# Patient Record
Sex: Male | Born: 1960 | Race: White | Hispanic: No | Marital: Married | Smoking: Never smoker
Health system: Southern US, Community
[De-identification: ages and names within clinical notes are randomized; demographics above are authoritative.]

## PROBLEM LIST (undated history)

## (undated) DIAGNOSIS — F419 Anxiety disorder, unspecified: Secondary | ICD-10-CM

## (undated) DIAGNOSIS — R7309 Other abnormal glucose: Secondary | ICD-10-CM

## (undated) DIAGNOSIS — E291 Testicular hypofunction: Secondary | ICD-10-CM

## (undated) DIAGNOSIS — M545 Low back pain, unspecified: Secondary | ICD-10-CM

## (undated) DIAGNOSIS — L989 Disorder of the skin and subcutaneous tissue, unspecified: Secondary | ICD-10-CM

## (undated) DIAGNOSIS — Z860100 Personal history of colon polyps, unspecified: Secondary | ICD-10-CM

## (undated) DIAGNOSIS — F32A Depression, unspecified: Secondary | ICD-10-CM

## (undated) DIAGNOSIS — T7840XA Allergy, unspecified, initial encounter: Secondary | ICD-10-CM

## (undated) DIAGNOSIS — F329 Major depressive disorder, single episode, unspecified: Secondary | ICD-10-CM

## (undated) DIAGNOSIS — E785 Hyperlipidemia, unspecified: Secondary | ICD-10-CM

## (undated) DIAGNOSIS — M1990 Unspecified osteoarthritis, unspecified site: Secondary | ICD-10-CM

## (undated) DIAGNOSIS — K219 Gastro-esophageal reflux disease without esophagitis: Secondary | ICD-10-CM

## (undated) DIAGNOSIS — N529 Male erectile dysfunction, unspecified: Secondary | ICD-10-CM

## (undated) DIAGNOSIS — G4700 Insomnia, unspecified: Secondary | ICD-10-CM

## (undated) DIAGNOSIS — I10 Essential (primary) hypertension: Secondary | ICD-10-CM

## (undated) HISTORY — DX: Personal history of colonic polyps: Z86.010

## (undated) HISTORY — DX: Anxiety disorder, unspecified: F41.9

## (undated) HISTORY — DX: Depression, unspecified: F32.A

## (undated) HISTORY — DX: Low back pain, unspecified: M54.50

## (undated) HISTORY — DX: Major depressive disorder, single episode, unspecified: F32.9

## (undated) HISTORY — DX: Low back pain: M54.5

## (undated) HISTORY — DX: Insomnia, unspecified: G47.00

## (undated) HISTORY — DX: Disorder of the skin and subcutaneous tissue, unspecified: L98.9

## (undated) HISTORY — DX: Personal history of colon polyps, unspecified: Z86.0100

## (undated) HISTORY — DX: Male erectile dysfunction, unspecified: N52.9

## (undated) HISTORY — DX: Testicular hypofunction: E29.1

## (undated) HISTORY — DX: Allergy, unspecified, initial encounter: T78.40XA

## (undated) HISTORY — DX: Other abnormal glucose: R73.09

## (undated) HISTORY — DX: Hyperlipidemia, unspecified: E78.5

## (undated) HISTORY — DX: Essential (primary) hypertension: I10

## (undated) HISTORY — DX: Unspecified osteoarthritis, unspecified site: M19.90

## (undated) HISTORY — PX: BREAST MASS EXCISION: SHX1267

## (undated) HISTORY — PX: MASS EXCISION: SHX2000

---

## 2000-02-28 HISTORY — PX: EYE SURGERY: SHX253

## 2003-08-13 DIAGNOSIS — L989 Disorder of the skin and subcutaneous tissue, unspecified: Secondary | ICD-10-CM | POA: Insufficient documentation

## 2007-04-18 ENCOUNTER — Ambulatory Visit: Payer: Self-pay | Admitting: Gastroenterology

## 2007-05-01 ENCOUNTER — Ambulatory Visit: Payer: Self-pay | Admitting: Gastroenterology

## 2007-05-01 ENCOUNTER — Encounter: Payer: Self-pay | Admitting: Gastroenterology

## 2010-10-17 ENCOUNTER — Other Ambulatory Visit: Payer: Self-pay | Admitting: Internal Medicine

## 2010-10-17 DIAGNOSIS — R519 Headache, unspecified: Secondary | ICD-10-CM

## 2010-10-19 ENCOUNTER — Ambulatory Visit
Admission: RE | Admit: 2010-10-19 | Discharge: 2010-10-19 | Disposition: A | Discharge status: Home / Self Care | Payer: 59 | Source: Ambulatory Visit | Attending: Internal Medicine | Admitting: Internal Medicine

## 2010-10-19 DIAGNOSIS — R519 Headache, unspecified: Secondary | ICD-10-CM

## 2010-10-19 MED ORDER — GADOBENATE DIMEGLUMINE 529 MG/ML IV SOLN
10.0000 mL | Freq: Once | INTRAVENOUS | Status: AC | PRN
  Administered 2010-10-19: 10 mL via INTRAVENOUS

## 2010-11-30 ENCOUNTER — Other Ambulatory Visit: Payer: Self-pay

## 2011-02-05 ENCOUNTER — Ambulatory Visit (INDEPENDENT_AMBULATORY_CARE_PROVIDER_SITE_OTHER): Payer: 59

## 2011-02-05 DIAGNOSIS — E236 Other disorders of pituitary gland: Secondary | ICD-10-CM

## 2011-02-20 ENCOUNTER — Ambulatory Visit (INDEPENDENT_AMBULATORY_CARE_PROVIDER_SITE_OTHER): Payer: 59

## 2011-02-20 DIAGNOSIS — E236 Other disorders of pituitary gland: Secondary | ICD-10-CM

## 2011-03-03 ENCOUNTER — Ambulatory Visit (INDEPENDENT_AMBULATORY_CARE_PROVIDER_SITE_OTHER): Payer: BC Managed Care – PPO | Admitting: Physician Assistant

## 2011-03-03 DIAGNOSIS — M629 Disorder of muscle, unspecified: Secondary | ICD-10-CM

## 2011-03-03 DIAGNOSIS — R030 Elevated blood-pressure reading, without diagnosis of hypertension: Secondary | ICD-10-CM

## 2011-03-03 DIAGNOSIS — E789 Disorder of lipoprotein metabolism, unspecified: Secondary | ICD-10-CM

## 2011-03-03 DIAGNOSIS — E236 Other disorders of pituitary gland: Secondary | ICD-10-CM

## 2011-03-15 ENCOUNTER — Encounter: Payer: Self-pay | Admitting: Physician Assistant

## 2011-03-15 DIAGNOSIS — L989 Disorder of the skin and subcutaneous tissue, unspecified: Secondary | ICD-10-CM

## 2011-03-15 DIAGNOSIS — N529 Male erectile dysfunction, unspecified: Secondary | ICD-10-CM | POA: Insufficient documentation

## 2011-03-15 DIAGNOSIS — G4700 Insomnia, unspecified: Secondary | ICD-10-CM

## 2011-03-15 DIAGNOSIS — E349 Endocrine disorder, unspecified: Secondary | ICD-10-CM | POA: Insufficient documentation

## 2011-03-15 DIAGNOSIS — F419 Anxiety disorder, unspecified: Secondary | ICD-10-CM | POA: Insufficient documentation

## 2011-03-15 DIAGNOSIS — M545 Low back pain, unspecified: Secondary | ICD-10-CM

## 2011-03-15 DIAGNOSIS — Z86010 Personal history of colonic polyps: Secondary | ICD-10-CM

## 2011-03-15 DIAGNOSIS — F329 Major depressive disorder, single episode, unspecified: Secondary | ICD-10-CM

## 2011-03-15 DIAGNOSIS — F32A Depression, unspecified: Secondary | ICD-10-CM

## 2011-03-15 DIAGNOSIS — R7309 Other abnormal glucose: Secondary | ICD-10-CM

## 2011-03-15 DIAGNOSIS — E785 Hyperlipidemia, unspecified: Secondary | ICD-10-CM

## 2011-03-19 ENCOUNTER — Ambulatory Visit (INDEPENDENT_AMBULATORY_CARE_PROVIDER_SITE_OTHER): Payer: BC Managed Care – PPO

## 2011-03-19 DIAGNOSIS — E236 Other disorders of pituitary gland: Secondary | ICD-10-CM

## 2011-04-02 ENCOUNTER — Ambulatory Visit (INDEPENDENT_AMBULATORY_CARE_PROVIDER_SITE_OTHER): Payer: BC Managed Care – PPO | Admitting: Physician Assistant

## 2011-04-02 DIAGNOSIS — E236 Other disorders of pituitary gland: Secondary | ICD-10-CM

## 2011-04-02 MED ORDER — TESTOSTERONE CYPIONATE 200 MG/ML IM SOLN
200.0000 mg | Freq: Once | INTRAMUSCULAR | Status: AC
  Administered 2011-04-02: 200 mg via INTRAMUSCULAR

## 2011-04-16 ENCOUNTER — Ambulatory Visit: Payer: BC Managed Care – PPO

## 2011-04-16 ENCOUNTER — Ambulatory Visit (INDEPENDENT_AMBULATORY_CARE_PROVIDER_SITE_OTHER): Payer: BC Managed Care – PPO | Admitting: Physician Assistant

## 2011-04-16 DIAGNOSIS — E291 Testicular hypofunction: Secondary | ICD-10-CM

## 2011-04-16 DIAGNOSIS — E236 Other disorders of pituitary gland: Secondary | ICD-10-CM

## 2011-04-16 MED ORDER — TESTOSTERONE CYPIONATE 200 MG/ML IM SOLN
200.0000 mg | Freq: Once | INTRAMUSCULAR | Status: AC
  Administered 2011-04-16: 200 mg via INTRAMUSCULAR

## 2011-04-16 NOTE — Progress Notes (Signed)
OK for testosterone injection. Last PSA normal 02/2011. See paper chart for standing order of injection q 2 weeks.  Eula Listen, PA-C 04/16/2011 8:07 AM

## 2011-04-30 ENCOUNTER — Ambulatory Visit (INDEPENDENT_AMBULATORY_CARE_PROVIDER_SITE_OTHER): Payer: BC Managed Care – PPO | Admitting: Physician Assistant

## 2011-04-30 DIAGNOSIS — E236 Other disorders of pituitary gland: Secondary | ICD-10-CM

## 2011-04-30 NOTE — Progress Notes (Signed)
  Subjective:    Patient ID: Curtis Yang, male    DOB: [birth year], 51 y.o.   MRN: [id number]  HPI  Testosterone injection only  Review of Systems     Objective:   Physical Exam        Assessment & Plan:

## 2011-05-14 ENCOUNTER — Telehealth: Payer: Self-pay | Admitting: Radiology

## 2011-05-14 ENCOUNTER — Ambulatory Visit (INDEPENDENT_AMBULATORY_CARE_PROVIDER_SITE_OTHER): Payer: BC Managed Care – PPO | Admitting: Physician Assistant

## 2011-05-14 DIAGNOSIS — E291 Testicular hypofunction: Secondary | ICD-10-CM

## 2011-05-14 DIAGNOSIS — E236 Other disorders of pituitary gland: Secondary | ICD-10-CM

## 2011-05-14 MED ORDER — TESTOSTERONE CYPIONATE 200 MG/ML IM SOLN
200.0000 mg | INTRAMUSCULAR | Status: DC
  Administered 2011-05-14 – 2011-06-07 (×2): 200 mg via INTRAMUSCULAR

## 2011-05-14 NOTE — Progress Notes (Signed)
Needs Testosterone 200/ml today.  Standing order entered for q 14 days through July when he needs CPE.  OK to give today

## 2011-05-16 MED ORDER — TESTOSTERONE CYPIONATE 200 MG/ML IM SOLN: 200.0000 mg | INTRAMUSCULAR | Status: DC

## 2011-05-17 NOTE — Telephone Encounter (Signed)
Spoke with patient and informed him that his rx was called in; patient appreciative

## 2011-05-28 ENCOUNTER — Ambulatory Visit (INDEPENDENT_AMBULATORY_CARE_PROVIDER_SITE_OTHER): Payer: BC Managed Care – PPO | Admitting: Physician Assistant

## 2011-05-28 VITALS — BP 132/78 | HR 99 | Temp 98.4°F | Resp 16 | Ht 72.0 in | Wt 201.0 lb

## 2011-05-28 DIAGNOSIS — E236 Other disorders of pituitary gland: Secondary | ICD-10-CM

## 2011-05-28 DIAGNOSIS — E291 Testicular hypofunction: Secondary | ICD-10-CM

## 2011-05-28 MED ORDER — TESTOSTERONE CYPIONATE 200 MG/ML IM SOLN
200.0000 mg | INTRAMUSCULAR | Status: DC
  Administered 2011-05-28 – 2011-07-24 (×3): 200 mg via INTRAMUSCULAR

## 2011-05-28 NOTE — Progress Notes (Signed)
Here for testosterone injection. Ok for injection. Labs reviewed.  Latonga Ponder

## 2011-06-07 ENCOUNTER — Ambulatory Visit (INDEPENDENT_AMBULATORY_CARE_PROVIDER_SITE_OTHER): Payer: BC Managed Care – PPO | Admitting: Internal Medicine

## 2011-06-07 ENCOUNTER — Encounter: Payer: Self-pay | Admitting: Physician Assistant

## 2011-06-07 VITALS — BP 128/88 | HR 89 | Temp 97.4°F | Resp 16 | Ht 72.0 in | Wt 198.0 lb

## 2011-06-07 DIAGNOSIS — F458 Other somatoform disorders: Secondary | ICD-10-CM

## 2011-06-07 DIAGNOSIS — H9201 Otalgia, right ear: Secondary | ICD-10-CM

## 2011-06-07 DIAGNOSIS — J302 Other seasonal allergic rhinitis: Secondary | ICD-10-CM

## 2011-06-07 DIAGNOSIS — F411 Generalized anxiety disorder: Secondary | ICD-10-CM

## 2011-06-07 DIAGNOSIS — R6884 Jaw pain: Secondary | ICD-10-CM

## 2011-06-07 DIAGNOSIS — E236 Other disorders of pituitary gland: Secondary | ICD-10-CM

## 2011-06-07 DIAGNOSIS — R079 Chest pain, unspecified: Secondary | ICD-10-CM

## 2011-06-07 DIAGNOSIS — R109 Unspecified abdominal pain: Secondary | ICD-10-CM

## 2011-06-07 LAB — POCT UA - MICROSCOPIC ONLY
Mucus, UA: NEGATIVE
WBC, Ur, HPF, POC: NEGATIVE
Yeast, UA: NEGATIVE

## 2011-06-07 LAB — POCT URINALYSIS DIPSTICK
Ketones, UA: NEGATIVE
Protein, UA: NEGATIVE
Spec Grav, UA: 1.02
pH, UA: 7.5

## 2011-06-07 MED ORDER — OMEPRAZOLE 20 MG PO CPDR: 20.0000 mg | DELAYED_RELEASE_CAPSULE | Freq: Every day | ORAL | Status: DC

## 2011-06-07 NOTE — Progress Notes (Signed)
Subjective:    Patient ID: Curtis Yang, male    DOB: Aug 13, 1960, 51 y.o.   MRN: [id number]  HPI 65)51 yo w substernal chest pain, worsens with stress. Or with deep breath. No SOB. Some L jaw tightness. No pain right now.  Pain is sharp, lasts several minutes.  Shllow breathing seems to help. No diaphoresis.  He does a physically strenuous job and hasn't had chest pain during these times.  He has been experiencing the CP now for 2 weeks. Admits to increase stress at home.  Hasn't tried any OTC meds.  No palpitations.  No nausea.  2) A few days early, but wants to get testosterone injection today while here.  3)R otalgia  4) R side pain from mid lateral abdomen diagonally toward groin.  Occasionally it is sharp, but mostly just a dull ache.  Present X couple weeks.  No h/o nephrolithiasis.  Worse with bending forward.  He has been doing yard work with lots of bending, raking, etc. No bulging  FH maternal gm stents in 60s, but she lived to be in her 48's. Maternal gf died of stroke in late 90s. His other family history is vague as he wasn't raised by his parents.  His does know that many males suffered and died of heart disease on his father's side of the family.  Review of Systems  All other systems reviewed and are negative.       Objective:   Physical Exam  Constitutional: He is oriented to person, place, and time. He appears well-developed and well-nourished.       Appears more on the anxious side for him, but normal and NAD.  HENT:  Head: Normocephalic and atraumatic.  Right Ear: External ear normal.  Left Ear: External ear normal.  Mouth/Throat: Oropharynx is clear and moist. No oropharyngeal exudate.       Turbinates pale and slightly boggy.  Slight fluid behind R TM.  TTP over the TMJ  Neck: No JVD present. No tracheal deviation present. No thyromegaly present.  Cardiovascular: Normal rate, regular rhythm, normal heart sounds and intact distal pulses.  Exam reveals no  gallop and no friction rub.   No murmur heard. Pulmonary/Chest: Effort normal and breath sounds normal.  Abdominal: Soft. Bowel sounds are normal. He exhibits no distension and no mass. There is no rebound and no guarding.       Slight TTP over psoas/illiopsoas distribution.  No inguinal hernia.  Neurological: He is alert and oriented to person, place, and time.  Skin: Skin is warm and dry.      Results for orders placed in visit on 06/07/11  POCT URINALYSIS DIPSTICK      Component Value Range   Color, UA yellow     Clarity, UA clear     Glucose, UA neg     Bilirubin, UA neg     Ketones, UA neg     Spec Grav, UA 1.020     Blood, UA neg     pH, UA 7.5     Protein, UA neg     Urobilinogen, UA 0.2     Nitrite, UA neg     Leukocytes, UA Negative    POCT UA - MICROSCOPIC ONLY      Component Value Range   WBC, Ur, HPF, POC neg     RBC, urine, microscopic 1-3     Bacteria, U Microscopic neg     Mucus, UA neg     Epithelial cells,  urine per micros 0-1     Crystals, Ur, HPF, POC neg     Casts, Ur, LPF, POC neg     Yeast, UA neg     EKG: sinus arrythmia unchanged from previous tracings, read by Ellamae Sia, MD     Assessment & Plan:  Chest pain-No changes on EKG.  Discussed CP warnings, call 911 and patient expresses understanding and agrees.  Refer to cardiology-?stress treadmill.  Also will try omeprazole to see if reflux may be contributing to his symptoms. Increased situational anxiety as a contributing factor. 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  Restart flonase and buy and use a bite block. R abd pain-non acute abdomen-believe musculoskeletal-rest area, tylenol or advil, RTC if any changes or increases.  Last CMET in 02/2011 was normal.

## 2011-06-07 NOTE — Patient Instructions (Signed)
Call 911 if Chest pain worsens or changes

## 2011-06-19 ENCOUNTER — Telehealth: Payer: Self-pay

## 2011-06-19 NOTE — Telephone Encounter (Signed)
PT STATES THE LORAZEPAM HE IS ON ISN'T HELPING ANYMORE AND WOULD LIKE TO KNOW IF HE CAN INCREASE THE DOSAGE OR GET SOMETHING ELSE PLEASE CALL [phone]

## 2011-06-19 NOTE — Telephone Encounter (Signed)
Please advise on this.  

## 2011-06-19 NOTE — Telephone Encounter (Signed)
Spoke with patient and let him know that he needed to rtc before new meds were prescribed.  He stated that he would come in on Tuesday to see Marylene Land.

## 2011-06-19 NOTE — Telephone Encounter (Signed)
Due to his recent chest pain, he needs to return to clinic to discuss what meds might be best for him,  Dr. Merla Riches is here tomorrow 10-4

## 2011-06-20 ENCOUNTER — Encounter: Payer: Self-pay | Admitting: *Deleted

## 2011-06-20 ENCOUNTER — Encounter: Payer: Self-pay | Admitting: Cardiovascular Disease

## 2011-06-21 ENCOUNTER — Encounter: Payer: Self-pay | Admitting: Cardiovascular Disease

## 2011-06-21 ENCOUNTER — Ambulatory Visit (INDEPENDENT_AMBULATORY_CARE_PROVIDER_SITE_OTHER): Payer: BC Managed Care – PPO | Admitting: Cardiovascular Disease

## 2011-06-21 DIAGNOSIS — G4700 Insomnia, unspecified: Secondary | ICD-10-CM

## 2011-06-21 DIAGNOSIS — F411 Generalized anxiety disorder: Secondary | ICD-10-CM

## 2011-06-21 DIAGNOSIS — E291 Testicular hypofunction: Secondary | ICD-10-CM

## 2011-06-21 DIAGNOSIS — R079 Chest pain, unspecified: Secondary | ICD-10-CM | POA: Insufficient documentation

## 2011-06-21 DIAGNOSIS — F419 Anxiety disorder, unspecified: Secondary | ICD-10-CM

## 2011-06-21 NOTE — Progress Notes (Signed)
Patient ID: Curtis Yang, male   DOB: 1960/12/16, 51 y.o.   MRN: [id number] 51 yo referred by Dr Cleta Alberts for SSCP.  Atypical.  Associated with anxiety disorder.  Anxiety runs in family.  Does not see psychiatrist but on lorazapam.  SSCP during anxious times maybe once/month.  Can las days and tightness in chest.  No dyspnea, diaphoresis.  Occasional palpitaitons.  Does not get it when he is calm.  Stress at home or work set him off.  Has not had recent stress test.  Limited CRF;s.  Has worse insomnia lately.    ROS: Denies fever, malais, weight loss, blurry vision, decreased visual acuity, cough, sputum, SOB, hemoptysis, pleuritic pain, palpitaitons, heartburn, abdominal pain, melena, lower extremity edema, claudication, or rash.  All other systems reviewed and negative   General: Affect appropriate Healthy:  appears stated age HEENT: normal Neck supple with no adenopathy JVP normal no bruits no thyromegaly Lungs clear with no wheezing and good diaphragmatic motion Heart:  S1/S2 no murmur,rub, gallop or click PMI normal Abdomen: benighn, BS positve, no tenderness, no AAA no bruit.  No HSM or HJR Distal pulses intact with no bruits No edema Neuro non-focal Skin warm and dry No muscular weakness  Medications Current Outpatient Prescriptions  Medication Sig Dispense Refill  . amitriptyline (ELAVIL) 25 MG tablet Take 25 mg by mouth at bedtime.      [name] aspirin 81 MG tablet Take 81 mg by mouth daily.       . fish oil-omega-3 fatty acids 1000 MG capsule Take 2 g by mouth daily.      [name] LORazepam (ATIVAN) 0.5 MG tablet Take 0.5 mg by mouth 2 (two) times daily.      [name] omeprazole (PRILOSEC) 20 MG capsule Take 1 capsule (20 mg total) by mouth daily.  30 capsule  3  . testosterone cypionate (DEPOTESTOTERONE CYPIONATE) 200 MG/ML injection Inject 1 mL (200 mg total) into the muscle every 14 (fourteen) days.  10 mL  3   Current Facility-Administered Medications  Medication Dose Route Frequency  Provider Last Rate Last Dose  . testosterone cypionate (DEPOTESTOTERONE CYPIONATE) injection 200 mg  200 mg Intramuscular Q14 Days Pattricia Boss, PA-C   200 mg at 06/07/11 1247  . testosterone cypionate (DEPOTESTOTERONE CYPIONATE) injection 200 mg  200 mg Intramuscular Q14 Days Ryan M Dunn, PA-C   200 mg at 05/28/11 0900    Allergies Review of patient's allergies indicates no known allergies.  Family History: Family History  Problem Relation Age of Onset  . Heart disease Maternal Grandmother   . Stroke Maternal Grandfather     Social History: History   Social History  . Marital Status: Married    Spouse Name: N/A    Number of Children: N/A  . Years of Education: N/A   Occupational History  . Not on file.   Social History Main Topics  . Smoking status: Never Smoker   . Smokeless tobacco: Not on file  . Alcohol Use: No  . Drug Use: No  . Sexually Active: Not on file   Other Topics Concern  . Not on file   Social History Narrative  . No narrative on file    Electrocardiogram:  NSR rate 91  Normal ECG  Assessment and Plan

## 2011-06-21 NOTE — Assessment & Plan Note (Signed)
May benefit form psychological referral and not just BZ;s

## 2011-06-21 NOTE — Assessment & Plan Note (Signed)
Suggested melatonin and benedryl in addition to BZ

## 2011-06-21 NOTE — Assessment & Plan Note (Signed)
Likley related to anxiety  Normal exam and ECG  F/U ETT

## 2011-06-21 NOTE — Patient Instructions (Signed)
  Your physician has requested that you have an exercise tolerance test. For further information please visit [email or web address]. Please also follow instruction sheet, as given.  Your physician recommends that you schedule a follow-up appointment as needed with Dr. Nishan.  

## 2011-06-21 NOTE — Assessment & Plan Note (Signed)
Continue shots and F/U level with Dr Cleta Alberts

## 2011-06-25 ENCOUNTER — Ambulatory Visit (INDEPENDENT_AMBULATORY_CARE_PROVIDER_SITE_OTHER): Payer: BC Managed Care – PPO | Admitting: Physician Assistant

## 2011-06-25 VITALS — BP 110/63 | HR 82 | Temp 97.8°F | Resp 16 | Ht 72.0 in | Wt 198.0 lb

## 2011-06-25 DIAGNOSIS — E291 Testicular hypofunction: Secondary | ICD-10-CM

## 2011-06-25 MED ORDER — TESTOSTERONE CYPIONATE 200 MG/ML IM SOLN
200.0000 mg | Freq: Once | INTRAMUSCULAR | Status: AC
  Administered 2011-06-25: 200 mg via INTRAMUSCULAR

## 2011-06-25 NOTE — Progress Notes (Signed)
Ok for testosterone injection. Needs CPE July 2013.  Curtis Yang

## 2011-07-05 ENCOUNTER — Encounter: Payer: Self-pay | Admitting: Nurse Practitioner

## 2011-07-05 ENCOUNTER — Ambulatory Visit (INDEPENDENT_AMBULATORY_CARE_PROVIDER_SITE_OTHER): Payer: BC Managed Care – PPO | Admitting: Nurse Practitioner

## 2011-07-05 DIAGNOSIS — R079 Chest pain, unspecified: Secondary | ICD-10-CM

## 2011-07-05 NOTE — Procedures (Signed)
Exercise Treadmill Test  Pre-Exercise Testing Evaluation Rhythm: normal sinus  Rate: 87   PR:  .14 QRS:  .08  QT:  .35 QTc: .42     Test  Exercise Tolerance Test Ordering MD: Charlton Haws, MD  Interpreting MD: Ward Givens NP  Unique Test No: 1  Treadmill:  1  Indication for ETT: chest pain - rule out ischemia  Contraindication to ETT: No   Stress Modality: exercise - treadmill  Cardiac Imaging Performed: non   Protocol: standard Bruce - maximal  Max BP:  204/91  Max MPHR (bpm):  168 85% MPR (bpm):  142  MPHR obtained (bpm):  169 % MPHR obtained:  100%  Reached 85% MPHR (min:sec):  3:26 Total Exercise Time (min-sec):  6:00  Workload in METS:  7.0 Borg Scale: 17  Reason ETT Terminated:  fatigue    ST Segment Analysis At Rest: normal ST segments - no evidence of significant ST depression With Exercise: no evidence of significant ST depression  Other Information Arrhythmia:  isolated pvc's Angina during ETT:  absent (0) Quality of ETT:  diagnostic  ETT Interpretation:  normal - no evidence of ischemia by ST analysis  Comments: No acute st/t changes.  Hypertensive response to exercise.  Recommendations: F/U Dr. Eden Emms.

## 2011-07-09 ENCOUNTER — Ambulatory Visit (INDEPENDENT_AMBULATORY_CARE_PROVIDER_SITE_OTHER): Payer: BC Managed Care – PPO | Admitting: Physician Assistant

## 2011-07-09 DIAGNOSIS — E291 Testicular hypofunction: Secondary | ICD-10-CM

## 2011-07-09 NOTE — Progress Notes (Signed)
Here for testosterone injection.  OK to give.  Standing order in place. CPE 7/13

## 2011-07-17 ENCOUNTER — Telehealth: Payer: Self-pay | Admitting: Cardiovascular Disease

## 2011-07-17 NOTE — Telephone Encounter (Signed)
PT GXT WAS NORMAL ./CY

## 2011-07-17 NOTE — Telephone Encounter (Signed)
Pt rtn christine's call °

## 2011-07-24 ENCOUNTER — Ambulatory Visit (INDEPENDENT_AMBULATORY_CARE_PROVIDER_SITE_OTHER): Payer: BC Managed Care – PPO | Admitting: Physician Assistant

## 2011-07-24 VITALS — BP 132/74 | HR 70 | Temp 98.4°F | Resp 16 | Ht 72.75 in | Wt 203.8 lb

## 2011-07-24 DIAGNOSIS — R7989 Other specified abnormal findings of blood chemistry: Secondary | ICD-10-CM

## 2011-07-24 DIAGNOSIS — E236 Other disorders of pituitary gland: Secondary | ICD-10-CM

## 2011-07-24 NOTE — Progress Notes (Signed)
Presents for Testosterone injection.  Due for Follow-up visit and labs 07/2011.

## 2011-08-06 ENCOUNTER — Ambulatory Visit (INDEPENDENT_AMBULATORY_CARE_PROVIDER_SITE_OTHER): Payer: BC Managed Care – PPO | Admitting: Physician Assistant

## 2011-08-06 VITALS — BP 118/79 | HR 68 | Resp 18

## 2011-08-06 DIAGNOSIS — E291 Testicular hypofunction: Secondary | ICD-10-CM

## 2011-08-06 MED ORDER — TESTOSTERONE CYPIONATE 200 MG/ML IM SOLN
400.0000 mg | INTRAMUSCULAR | Status: DC
  Administered 2011-08-06 – 2011-10-01 (×5): 400 mg via INTRAMUSCULAR

## 2011-08-06 NOTE — Progress Notes (Signed)
Pt here for 2 week testosterone injection.  He has CPE in 1 month with Ms. McClung, PA-C.  His last injection was 07/24/11.  Ok to give today. Standing order entered for future.

## 2011-08-19 ENCOUNTER — Ambulatory Visit (INDEPENDENT_AMBULATORY_CARE_PROVIDER_SITE_OTHER): Payer: BC Managed Care – PPO | Admitting: Physician Assistant

## 2011-08-19 DIAGNOSIS — E291 Testicular hypofunction: Secondary | ICD-10-CM

## 2011-08-19 MED ORDER — LORAZEPAM 1 MG PO TABS: 1.0000 mg | ORAL_TABLET | Freq: Two times a day (BID) | ORAL | Status: DC

## 2011-08-19 NOTE — Progress Notes (Signed)
He is here today for his scheduled injection but also requests refill of Lorazepam.  Ok to do 1 time only as he needs to see A. McClung, PA-C.  No further refills without eval.

## 2011-09-03 ENCOUNTER — Ambulatory Visit (INDEPENDENT_AMBULATORY_CARE_PROVIDER_SITE_OTHER): Payer: BC Managed Care – PPO | Admitting: Physician Assistant

## 2011-09-03 VITALS — BP 124/68

## 2011-09-03 DIAGNOSIS — E236 Other disorders of pituitary gland: Secondary | ICD-10-CM

## 2011-09-03 DIAGNOSIS — E291 Testicular hypofunction: Secondary | ICD-10-CM

## 2011-09-15 ENCOUNTER — Encounter: Payer: Self-pay | Admitting: Physician Assistant

## 2011-09-15 ENCOUNTER — Ambulatory Visit (INDEPENDENT_AMBULATORY_CARE_PROVIDER_SITE_OTHER): Payer: BC Managed Care – PPO | Admitting: Physician Assistant

## 2011-09-15 VITALS — BP 129/87 | HR 94 | Temp 97.3°F | Resp 16

## 2011-09-15 DIAGNOSIS — E291 Testicular hypofunction: Secondary | ICD-10-CM

## 2011-09-15 DIAGNOSIS — E7800 Pure hypercholesterolemia, unspecified: Secondary | ICD-10-CM

## 2011-09-15 DIAGNOSIS — R030 Elevated blood-pressure reading, without diagnosis of hypertension: Secondary | ICD-10-CM

## 2011-09-15 DIAGNOSIS — R7989 Other specified abnormal findings of blood chemistry: Secondary | ICD-10-CM

## 2011-09-15 DIAGNOSIS — Z125 Encounter for screening for malignant neoplasm of prostate: Secondary | ICD-10-CM

## 2011-09-15 LAB — POCT URINALYSIS DIPSTICK
Bilirubin, UA: NEGATIVE
Blood, UA: NEGATIVE
Nitrite, UA: NEGATIVE
Protein, UA: 30
pH, UA: 7.5

## 2011-09-15 LAB — COMPREHENSIVE METABOLIC PANEL
AST: 19 U/L (ref 0–37)
Albumin: 4.8 g/dL (ref 3.5–5.2)
BUN: 13 mg/dL (ref 6–23)
CO2: 24 mEq/L (ref 19–32)
Calcium: 9.9 mg/dL (ref 8.4–10.5)
Chloride: 103 mEq/L (ref 96–112)
Creat: 1.08 mg/dL (ref 0.50–1.35)
Glucose, Bld: 93 mg/dL (ref 70–99)
Potassium: 4.3 mEq/L (ref 3.5–5.3)

## 2011-09-15 LAB — LIPID PANEL
HDL: 55 mg/dL (ref >39)
LDL Cholesterol: 150 mg/dL — ABNORMAL HIGH (ref 0–99)
Total CHOL/HDL Ratio: 4.1 Ratio
Triglycerides: 113 mg/dL (ref <150)
VLDL: 23 mg/dL (ref 0–40)

## 2011-09-15 LAB — CBC WITH DIFFERENTIAL/PLATELET
Basophils Absolute: 0 10*3/uL (ref 0.0–0.1)
Basophils Relative: 0 % (ref 0–1)
Eosinophils Absolute: 0 10*3/uL (ref 0.0–0.7)
MCH: 31.4 pg (ref 26.0–34.0)
MCHC: 34.8 g/dL (ref 30.0–36.0)
Neutrophils Relative %: 68 % (ref 43–77)
Platelets: 274 10*3/uL (ref 150–400)
RDW: 13.9 % (ref 11.5–15.5)

## 2011-09-15 LAB — PSA: PSA: 1.03 ng/mL (ref <=4.00)

## 2011-09-15 MED ORDER — LORAZEPAM 1 MG PO TABS: ORAL_TABLET | ORAL | Status: DC

## 2011-09-15 MED ORDER — AMITRIPTYLINE HCL 50 MG PO TABS: 50.0000 mg | ORAL_TABLET | Freq: Every day | ORAL | Status: DC

## 2011-09-15 MED ORDER — TESTOSTERONE CYPIONATE 200 MG/ML IM SOLN: 400.0000 mg | INTRAMUSCULAR | Status: DC

## 2011-09-15 MED ORDER — OMEPRAZOLE 20 MG PO CPDR: 20.0000 mg | DELAYED_RELEASE_CAPSULE | Freq: Every day | ORAL | Status: DC

## 2011-09-15 NOTE — Progress Notes (Signed)
  Subjective:    Patient ID: Curtis Yang, male    DOB: September 04, 1960, 51 y.o.   MRN: [id number]  HPI 51 yr old CM presents for a recheck of testosterone levels, hyperlipidemia, elevated BP readings, and anxiety.  He finds he has been more anxious over the last few weeks. He has also been out of/off of his elavil now for about 1 month.  We discussed this at length because the timing of his increased anxiety has coincided with him being off of the elavil.  He feels like things are going well from a testosterone standpoint and would like to proceed with injection training and being able to do these at home.  BPs have been normal when he has checked them.  He had a stress test with Dr. Elease Hashimoto and everything "came out ok."  He has made further dietary modifications and has continued the fish oil for his cholesterol.    Review of Systems  All other systems reviewed and are negative.       Objective:   Physical Exam  Nursing note and vitals reviewed. Constitutional: He is oriented to person, place, and time. He appears well-developed and well-nourished.       Fidgety and anxious  HENT:  Head: Normocephalic and atraumatic.  Cardiovascular: Normal rate, regular rhythm and normal heart sounds.   Pulmonary/Chest: Effort normal and breath sounds normal.  Neurological: He is alert and oriented to person, place, and time.  Skin: Skin is warm and dry.  Psychiatric: He has a normal mood and affect. His behavior is normal. Thought content normal.          Assessment & Plan:  Low testosterone-check levels, ok to give injection today even tho it's a few days early. Anxiety-may refill his lorazepam monthly at #90 for the next 6 months.  Restart elavil and titrate to 50mg  hs.  I think this will make his anxiety overall more manageable and will decrease his need for as much lorazepam.  Hypercholesterolemia-check labs

## 2011-09-18 LAB — TESTOSTERONE, FREE, TOTAL, SHBG: Testosterone, Free: 101.3 pg/mL (ref 47.0–244.0)

## 2011-09-23 ENCOUNTER — Telehealth: Payer: Self-pay

## 2011-09-23 NOTE — Telephone Encounter (Signed)
PT RETURNED CALL TO [facility] REGARDING HIS TRAINING ON INJECTIONS CALL [phone]

## 2011-09-24 NOTE — Telephone Encounter (Signed)
LMOM TO CB 

## 2011-09-27 NOTE — Telephone Encounter (Signed)
It looks like 104 was trying to reach pt to set up appt (see notes on lab results). Pt has appt sch for 10/18/11 for CPE and inj training so no further action should be needed.

## 2011-10-01 ENCOUNTER — Ambulatory Visit (INDEPENDENT_AMBULATORY_CARE_PROVIDER_SITE_OTHER): Payer: BC Managed Care – PPO | Admitting: Physician Assistant

## 2011-10-01 DIAGNOSIS — E291 Testicular hypofunction: Secondary | ICD-10-CM

## 2011-10-13 ENCOUNTER — Other Ambulatory Visit: Payer: Self-pay | Admitting: Family Medicine

## 2011-10-13 MED ORDER — LORAZEPAM 1 MG PO TABS: ORAL_TABLET | ORAL | Status: DC

## 2011-10-18 ENCOUNTER — Ambulatory Visit (INDEPENDENT_AMBULATORY_CARE_PROVIDER_SITE_OTHER): Payer: BC Managed Care – PPO | Admitting: Physician Assistant

## 2011-10-18 ENCOUNTER — Encounter: Payer: Self-pay | Admitting: Physician Assistant

## 2011-10-18 VITALS — BP 140/80 | HR 97 | Temp 99.2°F | Resp 16 | Ht 72.0 in | Wt 194.0 lb

## 2011-10-18 DIAGNOSIS — E785 Hyperlipidemia, unspecified: Secondary | ICD-10-CM

## 2011-10-18 DIAGNOSIS — Z23 Encounter for immunization: Secondary | ICD-10-CM

## 2011-10-18 DIAGNOSIS — Z1211 Encounter for screening for malignant neoplasm of colon: Secondary | ICD-10-CM

## 2011-10-18 DIAGNOSIS — Z0000 Encounter for general adult medical examination without abnormal findings: Secondary | ICD-10-CM

## 2011-10-18 MED ORDER — PRAVASTATIN SODIUM 20 MG PO TABS: 20.0000 mg | ORAL_TABLET | Freq: Every day | ORAL | Status: DC

## 2011-10-18 MED ORDER — LORAZEPAM 1 MG PO TABS: ORAL_TABLET | ORAL | Status: DC

## 2011-10-18 MED ORDER — AMITRIPTYLINE HCL 50 MG PO TABS: 50.0000 mg | ORAL_TABLET | Freq: Every day | ORAL | Status: DC

## 2011-10-18 NOTE — Progress Notes (Signed)
  Subjective:    Patient ID: Curtis Yang, male    DOB: [birth year], 51 y.o.   MRN: [id number]  HPI 51 yr old male presents for CPE.  He is ready to try a medication for his high cholesterol.  His headaches are stable.  His anxiety has definitely improved since restarting his elavil last month.  He is taking 1-2 ativan daily and feeling overall better than when I saw him last month.  His wife came to the office with him today and Sammuel Cooper will be going over home administration of testosterone with them after his physical.  He has no new/additional issues he would like to address.  We did his labs  At his last visit.  Review of Systems  All other systems reviewed and are negative.       Objective:   Physical Exam  Nursing note and vitals reviewed. Constitutional: He is oriented to person, place, and time. He appears well-developed and well-nourished.  HENT:  Head: Normocephalic and atraumatic.  Right Ear: External ear normal.  Left Ear: External ear normal.  Nose: Nose normal.  Mouth/Throat: Oropharynx is clear and moist. No oropharyngeal exudate.  Eyes: Conjunctivae and EOM are normal. Pupils are equal, round, and reactive to light. Right eye exhibits no discharge. Left eye exhibits no discharge. No scleral icterus.  Neck: Normal range of motion. Neck supple. No thyromegaly present.       No supraclavicular nodes  Cardiovascular: Normal rate, regular rhythm, normal heart sounds and intact distal pulses.  Exam reveals no gallop and no friction rub.   No murmur heard. Pulmonary/Chest: Effort normal and breath sounds normal. No respiratory distress. He has no wheezes. He has no rales. He exhibits no tenderness.  Abdominal: Soft. Bowel sounds are normal. He exhibits no distension and no mass. There is no tenderness. There is no rebound and no guarding.  Genitourinary: Rectum normal and penis normal. Guaiac negative stool. No penile tenderness.       B testicles WNL without mass or  hernia.  1 cm external hemorrhoid  Musculoskeletal: Normal range of motion.  Lymphadenopathy:    He has no cervical adenopathy.  Neurological: He is alert and oriented to person, place, and time.  Skin: Skin is warm and dry.  Psychiatric: He has a normal mood and affect. His behavior is normal. Judgment and thought content normal.   Results for orders placed in visit on 10/18/11  IFOBT (OCCULT BLOOD)      Component Value Range   IFOBT Negative         Assessment & Plan:  CPE Hypercholesterolemia-start pravastatin. Recheck in 2 months Anxiety-improved. Continue elavil. Gave Rxs of ativan for 11/12/2011 and for 12/12/2011. Continue testosterone injections every 2 weeks at 400mg .  Recheck in 6 months.

## 2011-11-01 ENCOUNTER — Telehealth: Payer: Self-pay

## 2011-11-01 MED ORDER — "SYRINGE/NEEDLE (DISP) 22G X 1-1/2"" 3 ML MISC": Status: DC

## 2011-11-01 NOTE — Telephone Encounter (Signed)
Syringes/needles sent.

## 2011-11-01 NOTE — Addendum Note (Signed)
Addended by: Fernande Bras on: 11/01/2011 12:48 PM   Modules accepted: Orders

## 2011-11-01 NOTE — Telephone Encounter (Signed)
pts wife called and states they need a script to get needles and syringes for home testosterone injections

## 2011-11-01 NOTE — Telephone Encounter (Signed)
LMOM notifying patient rx sent in. 

## 2011-11-02 NOTE — Progress Notes (Signed)
Presents for testosterone injection only. 

## 2011-11-30 ENCOUNTER — Telehealth: Payer: Self-pay

## 2011-11-30 NOTE — Telephone Encounter (Signed)
I left message for him to come in here for injection. To call me if any problems.

## 2011-11-30 NOTE — Telephone Encounter (Signed)
PT STATES HE HAVE QUESTIONS REGARDING THE INJECTION HE GIVES HIMSELF, STATES SOMEONE HERE SHOWED HIM HOW TO INJECT HIMSELF, BUT THEY HAVE NEW NEEDLES AND IT DOESN'T SEEM TO BE WORKING LIKE THE REST. PLEASE CALL [phone]

## 2011-12-21 NOTE — Progress Notes (Signed)
Presents for injection only

## 2011-12-21 NOTE — Progress Notes (Signed)
Presents for testosterone injection only.

## 2011-12-24 ENCOUNTER — Other Ambulatory Visit: Payer: Self-pay | Admitting: Physician Assistant

## 2011-12-25 ENCOUNTER — Other Ambulatory Visit: Payer: Self-pay | Admitting: *Deleted

## 2012-01-03 ENCOUNTER — Other Ambulatory Visit: Payer: Self-pay | Admitting: Radiology

## 2012-01-08 ENCOUNTER — Other Ambulatory Visit: Payer: Self-pay | Admitting: Physician Assistant

## 2012-01-08 NOTE — Telephone Encounter (Signed)
At tl desk 

## 2012-01-09 ENCOUNTER — Other Ambulatory Visit: Payer: Self-pay | Admitting: Radiology

## 2012-01-24 ENCOUNTER — Ambulatory Visit (INDEPENDENT_AMBULATORY_CARE_PROVIDER_SITE_OTHER): Payer: BC Managed Care – PPO | Admitting: Physician Assistant

## 2012-01-24 ENCOUNTER — Encounter: Payer: Self-pay | Admitting: Physician Assistant

## 2012-01-24 VITALS — BP 130/88 | HR 95 | Temp 98.7°F | Resp 16 | Ht 72.5 in | Wt 208.8 lb

## 2012-01-24 DIAGNOSIS — Z79899 Other long term (current) drug therapy: Secondary | ICD-10-CM

## 2012-01-24 DIAGNOSIS — E7800 Pure hypercholesterolemia, unspecified: Secondary | ICD-10-CM

## 2012-01-24 DIAGNOSIS — Z23 Encounter for immunization: Secondary | ICD-10-CM

## 2012-01-24 DIAGNOSIS — L723 Sebaceous cyst: Secondary | ICD-10-CM

## 2012-01-24 LAB — LIPID PANEL
Cholesterol: 209 mg/dL — ABNORMAL HIGH (ref 0–200)
Triglycerides: 111 mg/dL (ref <150)
VLDL: 22 mg/dL (ref 0–40)

## 2012-01-24 LAB — COMPREHENSIVE METABOLIC PANEL
BUN: 9 mg/dL (ref 6–23)
CO2: 31 mEq/L (ref 19–32)
Glucose, Bld: 97 mg/dL (ref 70–99)
Sodium: 138 mEq/L (ref 135–145)
Total Bilirubin: 0.5 mg/dL (ref 0.3–1.2)
Total Protein: 6.8 g/dL (ref 6.0–8.3)

## 2012-01-24 MED ORDER — LORAZEPAM 1 MG PO TABS: 1.0000 mg | ORAL_TABLET | Freq: Three times a day (TID) | ORAL | Status: DC | PRN

## 2012-01-24 NOTE — Progress Notes (Signed)
  Subjective:    Patient ID: Curtis Yang, male    DOB: Jun 28, 1960, 51 y.o.   MRN: [id number]  HPI Doing great.  Here for recheck since starting on pravastatin.  No jaundice, itching, abdominal pain, or muscle aches.  Small area on skin of back he wants me to look at.  He has never noticed it and it isn't bothering him, but his wife wanted me to look at it.  Review of Systems  All other systems reviewed and are negative.       Objective:   Physical Exam  Nursing note and vitals reviewed. Constitutional: He is oriented to person, place, and time. He appears well-developed and well-nourished.  HENT:  Head: Normocephalic and atraumatic.  Cardiovascular: Normal rate, regular rhythm and normal heart sounds.   Pulmonary/Chest: Effort normal and breath sounds normal.  Neurological: He is alert and oriented to person, place, and time.  Skin: Skin is warm and dry.       R of midline, mid-back <1cm non-irritated sebaceous cyst with the puncta in tact.  Psychiatric: He has a normal mood and affect. His behavior is normal.        Assessment & Plan:  Hyperlipidemia-check labs and we will make sure lipids are improved and LFTs are controlled to decide on plan. High risk meds-monitor today Sebaceous cyst-for now, watch.  We can remove this if he decides he wants to or it is bothering him. Please fill any meds patient may need for the next 6 months. He is on his last bottle of lorazepam now, so I printed him a prescription for his next bottle.

## 2012-01-25 MED ORDER — PRAVASTATIN SODIUM 20 MG PO TABS: 40.0000 mg | ORAL_TABLET | Freq: Every day | ORAL | Status: DC

## 2012-01-25 NOTE — Addendum Note (Signed)
Addended by: Anders Simmonds on: 01/25/2012 01:44 PM   Modules accepted: Orders

## 2012-02-24 ENCOUNTER — Ambulatory Visit (INDEPENDENT_AMBULATORY_CARE_PROVIDER_SITE_OTHER): Payer: BC Managed Care – PPO | Admitting: Family Medicine

## 2012-02-24 VITALS — BP 136/86 | HR 110 | Temp 98.6°F | Resp 18 | Ht 73.0 in | Wt 213.0 lb

## 2012-02-24 DIAGNOSIS — M545 Low back pain, unspecified: Secondary | ICD-10-CM

## 2012-02-24 MED ORDER — NAPROXEN 500 MG PO TABS: 500.0000 mg | ORAL_TABLET | Freq: Two times a day (BID) | ORAL | Status: DC

## 2012-02-24 MED ORDER — METHOCARBAMOL 500 MG PO TABS: 500.0000 mg | ORAL_TABLET | Freq: Four times a day (QID) | ORAL | Status: DC

## 2012-02-24 MED ORDER — HYDROCODONE-ACETAMINOPHEN 5-325 MG PO TABS: 1.0000 | ORAL_TABLET | Freq: Four times a day (QID) | ORAL | Status: DC | PRN

## 2012-02-24 NOTE — Progress Notes (Signed)
  Subjective:    Patient ID: Curtis Yang, male    DOB: [birth year], 51 y.o.   MRN: [id number] Chief Complaint  Patient presents with  . Back Pain    x several days     HPI  Hurt lower Rt back pain which he has had in the past, radiating through into groin - lifted more than he should have and twisted 4d prev.  Has been trying heat and cold and aleve 2 tabs qd w/o any sig relief.  Sometimes it radiates into testicle, no swelling or bulging.  No changes in urine or bowels. No f/c. No siginificant numbness or weakness - perhaps weakness due to pain.    Review of Systems    BP 136/86  Pulse 110  Temp(Src) 98.6 F (37 C)  Resp 18  Ht 6\' 1"  (1.854 m)  Wt 213 lb (96.616 kg)  BMI 28.11 kg/m2  SpO2 99% Objective:   Physical Exam        Assessment & Plan:  Low back pain - Plan: naproxen (NAPROSYN) 500 MG tablet, methocarbamol (ROBAXIN) 500 MG tablet, HYDROcodone-acetaminophen (NORCO) 5-325 MG per tablet  Meds ordered this encounter  Medications  . naproxen (NAPROSYN) 500 MG tablet    Sig: Take 1 tablet (500 mg total) by mouth 2 (two) times daily with a meal.    Dispense:  60 tablet    Refill:  1  . methocarbamol (ROBAXIN) 500 MG tablet    Sig: Take 1 tablet (500 mg total) by mouth 4 (four) times daily.    Dispense:  60 tablet    Refill:  0  . HYDROcodone-acetaminophen (NORCO) 5-325 MG per tablet    Sig: Take 1 tablet by mouth every 6 (six) hours as needed for pain.    Dispense:  15 tablet    Refill:  0

## 2012-02-29 ENCOUNTER — Other Ambulatory Visit: Payer: Self-pay | Admitting: Physician Assistant

## 2012-02-29 ENCOUNTER — Ambulatory Visit: Payer: BC Managed Care – PPO

## 2012-02-29 ENCOUNTER — Ambulatory Visit (INDEPENDENT_AMBULATORY_CARE_PROVIDER_SITE_OTHER): Payer: BC Managed Care – PPO | Admitting: Family Medicine

## 2012-02-29 VITALS — BP 140/102 | HR 120 | Temp 98.3°F | Resp 16 | Ht 72.0 in | Wt 202.0 lb

## 2012-02-29 DIAGNOSIS — M545 Low back pain, unspecified: Secondary | ICD-10-CM

## 2012-02-29 DIAGNOSIS — IMO0002 Reserved for concepts with insufficient information to code with codable children: Secondary | ICD-10-CM

## 2012-02-29 DIAGNOSIS — IMO0001 Reserved for inherently not codable concepts without codable children: Secondary | ICD-10-CM

## 2012-02-29 MED ORDER — PREDNISONE 20 MG PO TABS: ORAL_TABLET | ORAL | Status: DC

## 2012-02-29 MED ORDER — HYDROCODONE-ACETAMINOPHEN 5-325 MG PO TABS: ORAL_TABLET | ORAL | Status: DC

## 2012-02-29 MED ORDER — KETOROLAC TROMETHAMINE 60 MG/2ML IM SOLN
60.0000 mg | Freq: Once | INTRAMUSCULAR | Status: AC
  Administered 2012-02-29: 60 mg via INTRAMUSCULAR

## 2012-02-29 NOTE — Progress Notes (Signed)
[address]   [place], [place]  [address]   [phone]  Subjective:    Patient ID: Curtis Yang, male    DOB: [birth year], 52 y.o.   MRN: [id number]  HPIThis 52 y.o. male presents for evaluation of lower back pain.  Evaluated on 02/24/12 by Dr. Clelia Croft; prescribed NSAID, muscle relaxer, narcotic with worsening.  R lower back pain radiating into R groin region and now radiating into R posterior thigh and calf.  Clearing throat makes pain worse.  Was picking up an old tiller motor; twisted with acute onset of pain.  Occurred three days before evaluation.  History of chronic back issues; no known DDD.  No n/t/w.  Normal b/b function.  No saddle paresthesias.  Severity 7/10.  Slept a little last night.  Coughing makes pain worse.  Movement makes worse.  Job physically demanding; also drives a lot with work; has taken off today and likely tomorrow; on call 24 hours per day; on call until next week; has already arranged back up for on call coverage.    Review of Systems  Constitutional: Negative for chills, diaphoresis and fatigue.  Respiratory: Negative for shortness of breath.   Cardiovascular: Negative for chest pain and palpitations.  Genitourinary: Negative for difficulty urinating.  Musculoskeletal: Positive for myalgias and back pain.  Skin: Negative for rash.  Neurological: Negative for weakness and numbness.        Past Medical History  Diagnosis Date  . Hypogonadism male   . Hyperlipidemia   . Depression   . Anxiety   . Insomnia   . Benign skin lesion of nose   . Increased serum lipids   . Low back pain   . Erectile dysfunction     Due to decreased testosterone   . Increased glucose level   . Low testosterone   . History of colon polyps     History reviewed. No pertinent past surgical history.  Prior to Admission medications   Medication Sig Start Date End Date Taking? Authorizing Provider  amitriptyline (ELAVIL) 50 MG tablet Take 1 tablet (50 mg total) by mouth  at bedtime. 10/18/11  Yes Anders Simmonds, PA-C  aspirin 81 MG tablet Take 81 mg by mouth daily.    Yes Historical Provider, MD  LORazepam (ATIVAN) 1 MG tablet Take 1 tablet (1 mg total) by mouth every 8 (eight) hours as needed for anxiety. TAKE 1/2 TO 1 TABLET BY MOUTH UP TO THREE TIMES DAILY 01/24/12  Yes Marzella Schlein McClung, PA-C  methocarbamol (ROBAXIN) 500 MG tablet Take 1 tablet (500 mg total) by mouth 4 (four) times daily. 02/24/12  Yes Sherren Mocha, MD  omeprazole (PRILOSEC) 20 MG capsule Take 1 capsule (20 mg total) by mouth daily. 09/15/11 09/14/12 Yes Marzella Schlein McClung, PA-C  pravastatin (PRAVACHOL) 20 MG tablet Take 2 tablets (40 mg total) by mouth daily. At bedtime 01/25/12 01/24/13 Yes Marzella Schlein McClung, PA-C  SYRINGE-NEEDLE, DISP, 3 ML 22G X 1-1/2" 3 ML MISC Use to self-administer testosterone 11/01/11  Yes Chelle S Jeffery, PA-C  testosterone cypionate (DEPOTESTOTERONE CYPIONATE) 200 MG/ML injection INJECT 2 ML IN THE MUSCLE EVERY 2 WEEKS AS DIRECTED 12/24/11  Yes Ryan M Dunn, PA-C  fish oil-omega-3 fatty acids 1000 MG capsule Take 2 g by mouth daily.    Historical Provider, MD  HYDROcodone-acetaminophen (NORCO) 5-325 MG per tablet Take 1 tablet by mouth every 6 (six) hours as needed for pain. 02/24/12   Sherren Mocha, MD  naproxen (  NAPROSYN) 500 MG tablet Take 1 tablet (500 mg total) by mouth 2 (two) times daily with a meal. 02/24/12   Sherren Mocha, MD    No Known Allergies  History   Social History  . Marital Status: Married    Spouse Name: N/A    Number of Children: N/A  . Years of Education: N/A   Occupational History  . Not on file.   Social History Main Topics  . Smoking status: Never Smoker   . Smokeless tobacco: Not on file  . Alcohol Use: No  . Drug Use: No  . Sexually Active: Not on file   Other Topics Concern  . Not on file   Social History Narrative  . No narrative on file    Family History  Problem Relation Age of Onset  . Heart disease Maternal Grandmother   .  Stroke Maternal Grandfather     Objective:   Physical Exam  Nursing note and vitals reviewed. Constitutional: He is oriented to person, place, and time. He appears well-developed and well-nourished. He appears distressed.       Mild distress due to pain.  Eyes: Conjunctivae normal are normal. Pupils are equal, round, and reactive to light.  Cardiovascular: Regular rhythm and normal heart sounds.        Rate 105.  Pulmonary/Chest: Effort normal and breath sounds normal.  Musculoskeletal:       Lumbar back: He exhibits decreased range of motion, tenderness and pain. He exhibits no bony tenderness, no swelling and no spasm.       LUMBAR SPINE:  +TTP PARASPINAL REGION R; STRAIGHT LEG RAISE POSITIVE; MOTOR 5/5 BLE; TOE AND HEEL WALKING INTACT; MARCHING INTACT.    Neurological: He is alert and oriented to person, place, and time. He has normal strength. No sensory deficit.  Skin: No rash noted. He is not diaphoretic.  Psychiatric: He has a normal mood and affect. His behavior is normal.    TORADOL 60MG  IM ADMINISTERED IN OFFICE.  [facility] reading (PRIMARY) by  Dr. Katrinka Blazing.  LS SPINE: NARROWING L5-S1; NO ACUTE CHANGES.      Assessment & Plan:   1. Radicular pain of right lower back  DG Lumbar Spine Complete, ketorolac (TORADOL) injection 60 mg  2. Low back pain  HYDROcodone-acetaminophen (NORCO) 5-325 MG per tablet     1.  Lower back pain with R radiculopathy:  New.  S/p Toradol 60mg  IM. [name] Rx for Prednisone, Hydrocodone. Continue Robaxin qid.  OOW for next four days.  Advise frequent ambulation; avoid repetitive bending, twisting, rotating; avoid lifting > 10 pounds.  If no improvement in ten days, call office for ortho referral.    Meds ordered this encounter  Medications  . ketorolac (TORADOL) injection 60 mg    Sig:   . predniSONE (DELTASONE) 20 MG tablet    Sig: 3 tablets daily x 1 day, then two tablets daily x 5 days, then one tablet daily x 5 days    Dispense:  18 tablet    Refill:   0  . HYDROcodone-acetaminophen (NORCO) 5-325 MG per tablet    Sig: 1-2 tablets every six hours PRN pain    Dispense:  40 tablet    Refill:  0

## 2012-02-29 NOTE — Patient Instructions (Addendum)
1. Radicular pain of right lower back  DG Lumbar Spine Complete, ketorolac (TORADOL) injection 60 mg  2. Low back pain  HYDROcodone-acetaminophen (NORCO) 5-325 MG per tablet   Sciatica Sciatica is pain, weakness, numbness, or tingling along the path of the sciatic nerve. The nerve starts in the lower back and runs down the back of each leg. The nerve controls the muscles in the lower leg and in the back of the knee, while also providing sensation to the back of the thigh, lower leg, and the sole of your foot. Sciatica is a symptom of another medical condition. For instance, nerve damage or certain conditions, such as a herniated disk or bone spur on the spine, pinch or put pressure on the sciatic nerve. This causes the pain, weakness, or other sensations normally associated with sciatica. Generally, sciatica only affects one side of the body. CAUSES   Herniated or slipped disc.  Degenerative disk disease.  A pain disorder involving the narrow muscle in the buttocks (piriformis syndrome).  Pelvic injury or fracture.  Pregnancy.  Tumor (rare). SYMPTOMS  Symptoms can vary from mild to very severe. The symptoms usually travel from the low back to the buttocks and down the back of the leg. Symptoms can include:  Mild tingling or dull aches in the lower back, leg, or hip.  Numbness in the back of the calf or sole of the foot.  Burning sensations in the lower back, leg, or hip.  Sharp pains in the lower back, leg, or hip.  Leg weakness.  Severe back pain inhibiting movement. These symptoms may get worse with coughing, sneezing, laughing, or prolonged sitting or standing. Also, being overweight may worsen symptoms. DIAGNOSIS  Your caregiver will perform a physical exam to look for common symptoms of sciatica. He or she may ask you to do certain movements or activities that would trigger sciatic nerve pain. Other tests may be performed to find the cause of the sciatica. These may  include:  Blood tests.  X-rays.  Imaging tests, such as an MRI or CT scan. TREATMENT  Treatment is directed at the cause of the sciatic pain. Sometimes, treatment is not necessary and the pain and discomfort goes away on its own. If treatment is needed, your caregiver may suggest:  Over-the-counter medicines to relieve pain.  Prescription medicines, such as anti-inflammatory medicine, muscle relaxants, or narcotics.  Applying heat or ice to the painful area.  Steroid injections to lessen pain, irritation, and inflammation around the nerve.  Reducing activity during periods of pain.  Exercising and stretching to strengthen your abdomen and improve flexibility of your spine. Your caregiver may suggest losing weight if the extra weight makes the back pain worse.  Physical therapy.  Surgery to eliminate what is pressing or pinching the nerve, such as a bone spur or part of a herniated disk. HOME CARE INSTRUCTIONS   Only take over-the-counter or prescription medicines for pain or discomfort as directed by your caregiver.  Apply ice to the affected area for 20 minutes, 3 4 times a day for the first 48 72 hours. Then try heat in the same way.  Exercise, stretch, or perform your usual activities if these do not aggravate your pain.  Attend physical therapy sessions as directed by your caregiver.  Keep all follow-up appointments as directed by your caregiver.  Do not wear high heels or shoes that do not provide proper support.  Check your mattress to see if it is too soft. A firm mattress  may lessen your pain and discomfort. SEEK IMMEDIATE MEDICAL CARE IF:   You lose control of your bowel or bladder (incontinence).  You have increasing weakness in the lower back, pelvis, buttocks, or legs.  You have redness or swelling of your back.  You have a burning sensation when you urinate.  You have pain that gets worse when you lie down or awakens you at night.  Your pain is worse  than you have experienced in the past.  Your pain is lasting longer than 4 weeks.  You are suddenly losing weight without reason. MAKE SURE YOU:  Understand these instructions.  Will watch your condition.  Will get help right away if you are not doing well or get worse. Document Released: 02/07/2001 Document Revised: 08/15/2011 Document Reviewed: 06/25/2011 [facility] Patient Information 2013 [place], [place].

## 2012-03-02 ENCOUNTER — Other Ambulatory Visit: Payer: Self-pay | Admitting: Physician Assistant

## 2012-04-02 ENCOUNTER — Other Ambulatory Visit: Payer: Self-pay | Admitting: Physician Assistant

## 2012-04-04 ENCOUNTER — Ambulatory Visit (INDEPENDENT_AMBULATORY_CARE_PROVIDER_SITE_OTHER): Payer: BC Managed Care – PPO | Admitting: Physician Assistant

## 2012-04-04 ENCOUNTER — Ambulatory Visit: Payer: BC Managed Care – PPO

## 2012-04-04 VITALS — BP 150/89 | HR 99 | Temp 98.0°F | Resp 20 | Ht 72.5 in | Wt 201.0 lb

## 2012-04-04 DIAGNOSIS — R05 Cough: Secondary | ICD-10-CM

## 2012-04-04 DIAGNOSIS — R059 Cough, unspecified: Secondary | ICD-10-CM

## 2012-04-04 DIAGNOSIS — D649 Anemia, unspecified: Secondary | ICD-10-CM

## 2012-04-04 DIAGNOSIS — R030 Elevated blood-pressure reading, without diagnosis of hypertension: Secondary | ICD-10-CM

## 2012-04-04 DIAGNOSIS — J209 Acute bronchitis, unspecified: Secondary | ICD-10-CM

## 2012-04-04 DIAGNOSIS — Z111 Encounter for screening for respiratory tuberculosis: Secondary | ICD-10-CM

## 2012-04-04 LAB — POCT CBC
Granulocyte percent: 67.1 %G (ref 37–80)
HCT, POC: 41 % — AB (ref 43.5–53.7)
MCH, POC: 29.8 pg (ref 27–31.2)
MCV: 91.7 fL (ref 80–97)
MID (cbc): 0.5 (ref 0–0.9)
POC LYMPH PERCENT: 28.2 %L (ref 10–50)
RBC: 4.47 M/uL — AB (ref 4.69–6.13)
RDW, POC: 12.7 %
WBC: 10.3 10*3/uL — AB (ref 4.6–10.2)

## 2012-04-04 MED ORDER — AZITHROMYCIN 250 MG PO TABS: ORAL_TABLET | ORAL | Status: DC

## 2012-04-04 MED ORDER — OMEPRAZOLE 20 MG PO CPDR: 20.0000 mg | DELAYED_RELEASE_CAPSULE | Freq: Every day | ORAL | Status: DC

## 2012-04-04 MED ORDER — BENZONATATE 100 MG PO CAPS: 200.0000 mg | ORAL_CAPSULE | Freq: Three times a day (TID) | ORAL | Status: DC | PRN

## 2012-04-04 MED ORDER — AMITRIPTYLINE HCL 50 MG PO TABS: 50.0000 mg | ORAL_TABLET | Freq: Every day | ORAL | Status: DC

## 2012-04-04 MED ORDER — LORAZEPAM 1 MG PO TABS: ORAL_TABLET | ORAL | Status: DC

## 2012-04-04 NOTE — Progress Notes (Signed)
Subjective:    Patient ID: Curtis Yang, male    DOB: [birth year], 52 y.o.   MRN: [id number]  HPI 52 yr old CM c/o 3 week h/o cough.  Persistent. He has taken nyquil at night which has helped some. No weight loss.  He is coughing up green phlegm.  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  He doesn't know when the last time he has had a TB test.  He has not been taking his omeprazole for the last month. No f/c. Some headaches but no sinus pain. It is time for his refill on lorazepam.  He is taking it 3X daily and feels stable on that.  The elavil has also helped a lot. He has his physical scheduled with me in May. His Tdap was done in August.  He is doing well with his home testosterone injections.  Review of Systems  All other systems reviewed and are negative.       Tuberculosis Risk Questionnaire  1. Were you born outside the [place] in one of the following parts of the world:    [place], [place], [place], [place] or [place]?  No  2. Have you traveled outside the [place] and lived for more than one month in one of the following parts of the world:  [place], [place], [place], [place] or [place]?  Yes   3. Do you have a compromised immune system such as from any of the following conditions:  HIV/AIDS, organ or bone marrow transplantation, diabetes, immunosuppressive   medicines (e.g. Prednisone, Remicaide), leukemia, lymphoma, cancer of the   head or neck, gastrectomy or jejunal bypass, end-stage renal disease (on   dialysis), or silicosis?  No    4. Have you ever done one of the following:    Used crack cocaine, injected illegal drugs, worked or resided in jail or prison,   worked or resided at a homeless shelter, or worked as a Research scientist (physical sciences) in   direct contact with patients?  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 Have you ever been exposed to anyone with infectious tuberculosis?  No   Tuberculosis Symptom  Questionnaire  Do you currently have any of the following symptoms?  1. Unexplained cough lasting more than 3 weeks? Yes   Unexplained fever lasting more than 3 weeks. No   3. Night Sweats (sweating that leaves the bedclothes and sheets wet)   No  4. Shortness of Breath No  5. Chest Pain Yes   6. Unintentional weight loss  No  7. Unexplained fatigue (very tired for no reason) No   Objective:   Physical Exam  Nursing note and vitals reviewed. Constitutional: He is oriented to person, place, and time. He appears well-developed and well-nourished.  HENT:  Head: Normocephalic and atraumatic.  Right Ear: External ear normal.  Left Ear: External ear normal.  Mouth/Throat: Oropharynx is clear and moist. No oropharyngeal exudate (PND, no erythema).       B TM with fluid, no infection  Neck: Normal range of motion. Neck supple.       No supraclavicular nodes  Cardiovascular: Normal rate, regular rhythm, normal heart sounds and intact distal pulses.  Exam reveals no gallop and no friction rub.   No murmur heard. Pulmonary/Chest: Effort normal and breath sounds normal. He has no wheezes. He has no rales.  Lymphadenopathy:    He has no cervical adenopathy.  Neurological: He is alert and oriented to  person, place, and time.  Skin: Skin is warm and dry.  Psychiatric: He has a normal mood and affect. His behavior is normal.   [facility] reading (PRIMARY) by  Dr. Sherrlyn Hock LLL.  Results for orders placed in visit on 04/04/12  POCT CBC      Component Value Range   WBC 10.3 (*) 4.6 - 10.2 K/uL   Lymph, poc 2.9  0.6 - 3.4   POC LYMPH PERCENT 28.2  10 - 50 %L   MID (cbc) 0.5  0 - 0.9   POC MID % 4.7  0 - 12 %M   POC Granulocyte 6.9  2 - 6.9   Granulocyte percent 67.1  37 - 80 %G   RBC 4.47 (*) 4.69 - 6.13 M/uL   Hemoglobin 13.3 (*) 14.1 - 18.1 g/dL   HCT, POC 91.4 (*) 78.2 - 53.7 %   MCV 91.7  80 - 97 fL   MCH, POC 29.8  27 - 31.2 pg   MCHC 32.4  31.8 - 35.4 g/dL   RDW, POC  95.6     Platelet Count, POC 408  142 - 424 K/uL   MPV 8.2  0 - 99.8 fL       Assessment & Plan:  Persistent cough-cover for atypicals.  TB test placed.  Restart Prilosec. Continue other meds. Anxiety-stable on ativan and elavil. Anemia-new.  His hemoglobin in 08/2011 was 17.  It was 16.2 in January 2013. Today it is 13.3.  We will recheck this in 2-3 weeks.  He is going to schedule an appointment with me.  He is reliable in scheduling/keeping appointments.  Increased BP w/o diagnosis of htn.  I am unsure if his current illness and cough are causing his HA or if his BP is high.  (at his last OV, he was in pain).  He will check his BP OOO and bring in with him when I see him to recheck his Hgb. Meds ordered this encounter  Medications  . azithromycin (ZITHROMAX) 250 MG tablet    Sig: Take 2 today then 1 each day thereafter    Dispense:  6 tablet    Refill:  0    Order Specific Question:  Supervising Provider    Answer:  DOOLITTLE, ROBERT P [[id number]]  . omeprazole (PRILOSEC) 20 MG capsule    Sig: Take 1 capsule (20 mg total) by mouth daily.    Dispense:  30 capsule    Refill:  3    Order Specific Question:  Supervising Provider    Answer:  DOOLITTLE, ROBERT P [[id number]]  . amitriptyline (ELAVIL) 50 MG tablet    Sig: Take 1 tablet (50 mg total) by mouth at bedtime.    Dispense:  90 tablet    Refill:  1    Order Specific Question:  Supervising Provider    Answer:  DOOLITTLE, ROBERT P [[id number]]  . LORazepam (ATIVAN) 1 MG tablet    Sig: 1/2-1 tid prn anxiety    Dispense:  90 tablet    Refill:  0    Order Specific Question:  Supervising Provider    Answer:  DOOLITTLE, ROBERT P [[id number]]  . benzonatate (TESSALON) 100 MG capsule    Sig: Take 2 capsules (200 mg total) by mouth 3 (three) times daily as needed for cough.    Dispense:  40 capsule    Refill:  0    Order Specific Question:  Supervising Provider    Answer:  DOOLITTLE, ROBERT P [[id number]]

## 2012-04-04 NOTE — Patient Instructions (Signed)
Check BP OOO. Record and bring in numbers.  Schedule an appt with me in 2-13 weeks

## 2012-04-07 ENCOUNTER — Encounter (INDEPENDENT_AMBULATORY_CARE_PROVIDER_SITE_OTHER): Payer: BC Managed Care – PPO | Admitting: Radiology

## 2012-04-07 DIAGNOSIS — Z111 Encounter for screening for respiratory tuberculosis: Secondary | ICD-10-CM

## 2012-04-07 LAB — TB SKIN TEST

## 2012-04-21 ENCOUNTER — Other Ambulatory Visit: Payer: Self-pay | Admitting: Physician Assistant

## 2012-04-21 ENCOUNTER — Telehealth: Payer: Self-pay

## 2012-04-21 NOTE — Telephone Encounter (Signed)
Patient states that prescription refill for his testosterone injection is needing a prior authorization to fill it.  The Pharmacy is going to send [place] a form to fill out and fax back to them.   Best#: [id number]

## 2012-04-22 ENCOUNTER — Encounter: Payer: Self-pay | Admitting: Gastroenterology

## 2012-04-22 NOTE — Telephone Encounter (Signed)
Completed form for pt's Depo-testosterone and faxed to [facility]. Awaiting decision.

## 2012-04-23 ENCOUNTER — Telehealth: Payer: Self-pay

## 2012-04-23 NOTE — Telephone Encounter (Signed)
PA approved. See notes under 04/23/12 phone message.

## 2012-04-23 NOTE — Telephone Encounter (Signed)
Received approval of prior auth for pt's Depo-testosterone from 04/22/12 - 01/16/15. And faxed approval to pharmacy. Notified pt.

## 2012-04-24 ENCOUNTER — Other Ambulatory Visit: Payer: Self-pay | Admitting: Radiology

## 2012-04-27 ENCOUNTER — Other Ambulatory Visit: Payer: Self-pay | Admitting: Physician Assistant

## 2012-04-29 ENCOUNTER — Encounter: Payer: Self-pay | Admitting: Gastroenterology

## 2012-05-01 ENCOUNTER — Telehealth: Payer: Self-pay

## 2012-05-01 NOTE — Telephone Encounter (Signed)
PT SAYS PHARMACY SENT A REQUEST FOR A REFILL ON HIS LORAZEPAM SEVERAL DAYS AGO AND THEY HAVE NOT HEARD FROM [place].  CALL  [phone]

## 2012-05-01 NOTE — Telephone Encounter (Signed)
Lorazepam please advise on refill

## 2012-05-02 ENCOUNTER — Telehealth: Payer: Self-pay

## 2012-05-02 MED ORDER — LORAZEPAM 1 MG PO TABS: ORAL_TABLET | ORAL | Status: DC

## 2012-05-02 NOTE — Telephone Encounter (Signed)
Fax from [facility] for paitents Lorazepam. Please advise.

## 2012-05-02 NOTE — Telephone Encounter (Signed)
Pt said pharmacy has sent request for patients request for lorazapam and he has not heard anything would like to know what is going on with this please call him at [phone]

## 2012-05-02 NOTE — Telephone Encounter (Signed)
Signed and ready to fax

## 2012-05-03 ENCOUNTER — Other Ambulatory Visit: Payer: Self-pay | Admitting: Physician Assistant

## 2012-05-04 NOTE — Telephone Encounter (Signed)
Pt is currently out of his Lorazepam, pt would like to know if someone other than [facility] could refill this for him. Pt states that he has been waiting since march 1st. Best# [phone]

## 2012-05-04 NOTE — Telephone Encounter (Signed)
Patient advised of our policy regarding controlled substances. He does have an appt scheduled for March 19th please advise on refill.

## 2012-05-05 ENCOUNTER — Telehealth: Payer: Self-pay

## 2012-05-05 NOTE — Telephone Encounter (Signed)
PT NEEDS RX REFILL FOR LORAZAPAM.  PT WOULD LIKE THIS MESSAGE TO MAKE IT TO ANGELA MCCLUNG. STATES HE HAS CALLED 3X REGARDING THIS RX AND HE HAS NOT HEARD FROM ANGELA.

## 2012-05-06 ENCOUNTER — Telehealth: Payer: Self-pay

## 2012-05-06 NOTE — Telephone Encounter (Signed)
The following email was submitted to your website from Ardelle Lesches A request for a prescription was sent on 1 March from [facility]. I called 3 times in the last couple of days and spoke to someone at Urgent Care. Was told someone would contact me on each occasion. No one has contacted me yet. The pharmacy also resubmitted the request for authorization Thursday and no one from Urgent Care still hasn't responded. I would hope someone there would be polite enough to at least call me back. Customer service appears to have left town since [facility] took over.  Sincerely, Broghan Pannone [phone] Date of birth: 1960/10/02

## 2012-05-06 NOTE — Telephone Encounter (Signed)
Called Mr. Curtis Yang concerning his prescription. Advised patient to call Gaye to discuss. LVM

## 2012-05-06 NOTE — Telephone Encounter (Signed)
Called him. Apologized for my error. He appreciated the phone call.

## 2012-05-06 NOTE — Telephone Encounter (Signed)
Patient spoke with [facility], PA-C and she called in a verbal order for Rx.

## 2012-05-08 ENCOUNTER — Telehealth: Payer: Self-pay

## 2012-05-08 NOTE — Telephone Encounter (Signed)
Opened in error

## 2012-05-15 ENCOUNTER — Ambulatory Visit (INDEPENDENT_AMBULATORY_CARE_PROVIDER_SITE_OTHER): Payer: BC Managed Care – PPO | Admitting: Physician Assistant

## 2012-05-15 ENCOUNTER — Encounter: Payer: Self-pay | Admitting: Physician Assistant

## 2012-05-15 VITALS — BP 132/90 | HR 90 | Temp 98.1°F | Resp 16 | Ht 71.5 in | Wt 204.0 lb

## 2012-05-15 DIAGNOSIS — Z5181 Encounter for therapeutic drug level monitoring: Secondary | ICD-10-CM

## 2012-05-15 DIAGNOSIS — F411 Generalized anxiety disorder: Secondary | ICD-10-CM

## 2012-05-15 DIAGNOSIS — D649 Anemia, unspecified: Secondary | ICD-10-CM

## 2012-05-15 DIAGNOSIS — Z79890 Hormone replacement therapy (postmenopausal): Secondary | ICD-10-CM

## 2012-05-15 DIAGNOSIS — M545 Low back pain, unspecified: Secondary | ICD-10-CM

## 2012-05-15 LAB — POCT CBC
HCT, POC: 49.1 % (ref 43.5–53.7)
Hemoglobin: 16.2 g/dL (ref 14.1–18.1)
Lymph, poc: 1.9 (ref 0.6–3.4)
MCH, POC: 30.6 pg (ref 27–31.2)
MCHC: 33 g/dL (ref 31.8–35.4)
RBC: 5.29 M/uL (ref 4.69–6.13)
WBC: 5.9 10*3/uL (ref 4.6–10.2)

## 2012-05-15 MED ORDER — LORAZEPAM 1 MG PO TABS: ORAL_TABLET | ORAL | Status: DC

## 2012-05-15 MED ORDER — METHOCARBAMOL 500 MG PO TABS: 500.0000 mg | ORAL_TABLET | Freq: Three times a day (TID) | ORAL | Status: DC

## 2012-05-15 NOTE — Progress Notes (Signed)
  Subjective:    Patient ID: Curtis Yang, male    DOB: [birth year], 52 y.o.   MRN: [id number]  HPI 52 yr old CM here for f/up of low Hgb at most recent visit and also to check labs regarding testosterone replacement. He feels good.  We had a recent problem getting him his lorazepam filled and he was out for several days.  During this time, he had sugar cravings and sleep disturbance which have improved since he is back on his lorazepam. He feels good and no complaints today.  It is time for his 5 yr colonoscopy with Dr Russella Dar.  Review of Systems  All other systems reviewed and are negative.       Objective:   Physical Exam  Nursing note and vitals reviewed. Constitutional: He is oriented to person, place, and time. He appears well-developed and well-nourished.  HENT:  Head: Normocephalic and atraumatic.  Cardiovascular: Normal rate, regular rhythm and normal heart sounds.   Pulmonary/Chest: Effort normal and breath sounds normal.  Neurological: He is alert and oriented to person, place, and time.  Skin: Skin is warm and dry.  Psychiatric: He has a normal mood and affect. His behavior is normal.      Results for orders placed in visit on 05/15/12  PSA      Result Value Range   PSA 1.17  <=4.00 ng/mL  TESTOSTERONE, FREE, TOTAL      Result Value Range   Testosterone 1689.88 (*) 300 - 890 ng/dL   Sex Hormone Binding       Testosterone, Free       Testosterone-% Freee.      POCT CBC      Result Value Range   WBC 5.9  4.6 - 10.2 K/uL   Lymph, poc 1.9  0.6 - 3.4   POC LYMPH PERCENT 31.6  10 - 50 %L   MID (cbc) 0.4  0 - 0.9   POC MID % 6.3  0 - 12 %M   POC Granulocyte 3.7  2 - 6.9   Granulocyte percent 62.1  37 - 80 %G   RBC 5.29  4.69 - 6.13 M/uL   Hemoglobin 16.2  14.1 - 18.1 g/dL   HCT, POC 40.9  81.1 - 53.7 %   MCV 92.9  80 - 97 fL   MCH, POC 30.6  27 - 31.2 pg   MCHC 33.0  31.8 - 35.4 g/dL   RDW, POC 91.4     Platelet Count, POC 265  142 - 424 K/uL   MPV 9.0  0  - 99.8 fL        Assessment & Plan:  Anemia-improved!!  hgb is back to normal.  It has been 5 years since his last colonoscopy, so he will call and schedule with Dr. Russella Dar for his 3 yr colonoscopy.  He did have some benign polyps 5 years ago. Anxiety is controlled as long as he has his ativan.  I went ahead and printed out a prescription for April and for May.  He will be due for a new written prescription around June 8th, 2014.  Testosterone is high bc he just had his injection 3 days ago!

## 2012-05-16 LAB — TESTOSTERONE, FREE, TOTAL, SHBG
Sex Hormone Binding: 35 nmol/L (ref 13–71)
Testosterone, Free: 456.6 pg/mL — ABNORMAL HIGH (ref 47.0–244.0)
Testosterone-% Free: 2.7 % (ref 1.6–2.9)

## 2012-05-17 ENCOUNTER — Encounter: Payer: Self-pay | Admitting: *Deleted

## 2012-05-31 ENCOUNTER — Telehealth: Payer: Self-pay

## 2012-05-31 DIAGNOSIS — M545 Low back pain, unspecified: Secondary | ICD-10-CM

## 2012-05-31 MED ORDER — PREDNISONE 20 MG PO TABS: ORAL_TABLET | ORAL | Status: DC

## 2012-05-31 NOTE — Telephone Encounter (Signed)
Left message on machine for patient to D/C naprosyn and start prednisone (I sent him a prescription) and we will initiate an MRI referral and try to get him in next week. I will route to Lupita Leash for getting MRI appointment set up.

## 2012-05-31 NOTE — Telephone Encounter (Signed)
Patient states no relief from the Robaxin and Naproxen, he states the right leg is painful and numb/ please advise next step in plan of care, Amy

## 2012-05-31 NOTE — Telephone Encounter (Signed)
Patient would like to ask [facility] a question about some medication he is taking.  He is experiencing leg pain.  Please give him a call.

## 2012-05-31 NOTE — Telephone Encounter (Signed)
PT STATES HE HAD SPOKEN WITH ANGELA MCCLUNG AND WAS TOLD ABOUT THE PREDNISONE, BUT HE WOULD LIKE TO SPEAK WITH HER ABOUT IT PLEASE CALL [phone]

## 2012-05-31 NOTE — Telephone Encounter (Signed)
Spoke to patient, he states in January he took course of prednisone when he was here to see Dr Katrinka Blazing, it was helpful, but concerned because he was told by Marylene Land at office visit not to repeat the prednisone. Please advise.

## 2012-05-31 NOTE — Telephone Encounter (Signed)
Thanks, I have called him to advise  

## 2012-05-31 NOTE — Telephone Encounter (Signed)
Generally speaking that is true if the treatment courses are close together. However, it is now April it and not seeing that the patient is on any diabetic medications a short course should be ok.

## 2012-06-04 ENCOUNTER — Telehealth: Payer: Self-pay | Admitting: Radiology

## 2012-06-04 NOTE — Telephone Encounter (Signed)
Pt

## 2012-06-04 NOTE — Telephone Encounter (Signed)
Called [employer] to give more clinical information about patient, he has completed greater than 6 weeks of NSAID'S with no relief of pain and would be candidate for ESI or surgery after his MRI results are in. His has right sided radicular pain Auth # is [id number] good for 30 days.

## 2012-06-08 ENCOUNTER — Telehealth: Payer: Self-pay

## 2012-06-08 DIAGNOSIS — R9389 Abnormal findings on diagnostic imaging of other specified body structures: Secondary | ICD-10-CM

## 2012-06-08 DIAGNOSIS — M25561 Pain in right knee: Secondary | ICD-10-CM

## 2012-06-08 NOTE — Telephone Encounter (Signed)
PT IS HAVING AN MRI DONE TOMORROW AND HE ONLY WANT ANGELA MCCLUNG TO GIVE HIM A CALL BACK PLEASE CALL [phone]

## 2012-06-08 NOTE — Telephone Encounter (Signed)
Spoke with patient.  He is having MRI tomorrow and wants me to check on results.

## 2012-06-09 ENCOUNTER — Ambulatory Visit
Admission: RE | Admit: 2012-06-09 | Discharge: 2012-06-09 | Disposition: A | Discharge status: Home / Self Care | Payer: BC Managed Care – PPO | Source: Ambulatory Visit | Attending: Physician Assistant | Admitting: Physician Assistant

## 2012-06-09 DIAGNOSIS — M545 Low back pain, unspecified: Secondary | ICD-10-CM

## 2012-06-12 ENCOUNTER — Telehealth: Payer: Self-pay

## 2012-06-12 NOTE — Telephone Encounter (Signed)
PT STATES HE HAD SPOKEN WITH ANGELA MCCLUNG AND SHE WAS GOING TO MAIL HIM HIS MRI RESULTS AND HE STILL HASN'T RECEIVED THEM YET AND WANTED TO MAKE SURE IT WAS DONE. PLEASE CALL [phone] AND LET HM KNOW WHEN MAILED AND THE ADDRESS IS CORRECT IN SYSTEM

## 2012-06-12 NOTE — Telephone Encounter (Signed)
Sent a copy to be mailed tomorrow. Pt notified.

## 2012-06-18 ENCOUNTER — Other Ambulatory Visit: Payer: Self-pay | Admitting: Physician Assistant

## 2012-06-18 ENCOUNTER — Other Ambulatory Visit: Payer: Self-pay | Admitting: Family Medicine

## 2012-07-06 ENCOUNTER — Other Ambulatory Visit: Payer: Self-pay | Admitting: Physician Assistant

## 2012-07-08 ENCOUNTER — Encounter: Payer: Self-pay | Admitting: Family Medicine

## 2012-07-08 ENCOUNTER — Encounter: Payer: BC Managed Care – PPO | Admitting: Family Medicine

## 2012-07-08 ENCOUNTER — Ambulatory Visit (INDEPENDENT_AMBULATORY_CARE_PROVIDER_SITE_OTHER): Payer: BC Managed Care – PPO | Admitting: Family Medicine

## 2012-07-08 VITALS — BP 132/104 | HR 105 | Temp 98.4°F | Resp 16 | Ht 72.0 in | Wt 213.0 lb

## 2012-07-08 DIAGNOSIS — Z0000 Encounter for general adult medical examination without abnormal findings: Secondary | ICD-10-CM

## 2012-07-08 LAB — HEMOGLOBIN A1C: Hgb A1c MFr Bld: 5.5 % (ref <5.7)

## 2012-07-08 LAB — CBC WITH DIFFERENTIAL/PLATELET
Basophils Absolute: 0 10*3/uL (ref 0.0–0.1)
Eosinophils Relative: 1 % (ref 0–5)
HCT: 46.6 % (ref 39.0–52.0)
Hemoglobin: 16.1 g/dL (ref 13.0–17.0)
Lymphocytes Relative: 27 % (ref 12–46)
MCV: 87.8 fL (ref 78.0–100.0)
Monocytes Absolute: 0.6 10*3/uL (ref 0.1–1.0)
Monocytes Relative: 6 % (ref 3–12)
Neutro Abs: 6 10*3/uL (ref 1.7–7.7)
RBC: 5.31 MIL/uL (ref 4.22–5.81)
RDW: 14.1 % (ref 11.5–15.5)

## 2012-07-08 LAB — COMPREHENSIVE METABOLIC PANEL
AST: 21 U/L (ref 0–37)
Albumin: 4.8 g/dL (ref 3.5–5.2)
BUN: 16 mg/dL (ref 6–23)
CO2: 27 mEq/L (ref 19–32)
Calcium: 9.6 mg/dL (ref 8.4–10.5)
Chloride: 100 mEq/L (ref 96–112)
Creat: 1.17 mg/dL (ref 0.50–1.35)
Glucose, Bld: 89 mg/dL (ref 70–99)
Potassium: 4.2 mEq/L (ref 3.5–5.3)

## 2012-07-08 LAB — LIPID PANEL
Cholesterol: 170 mg/dL (ref 0–200)
HDL: 74 mg/dL (ref >39)
Total CHOL/HDL Ratio: 2.3 Ratio
Triglycerides: 69 mg/dL (ref <150)

## 2012-07-08 LAB — POCT URINALYSIS DIPSTICK
Blood, UA: NEGATIVE
Glucose, UA: NEGATIVE
Leukocytes, UA: NEGATIVE
Nitrite, UA: NEGATIVE
Urobilinogen, UA: 0.2

## 2012-07-08 LAB — VITAMIN B12: Vitamin B-12: 621 pg/mL (ref 211–911)

## 2012-07-08 LAB — IFOBT (OCCULT BLOOD): IFOBT: NEGATIVE

## 2012-07-08 MED ORDER — "SYRINGE/NEEDLE (DISP) 22G X 1-1/2"" 3 ML MISC": Status: DC

## 2012-07-08 MED ORDER — TESTOSTERONE CYPIONATE 200 MG/ML IM SOLN: 400.0000 mg | INTRAMUSCULAR | Status: DC

## 2012-07-08 MED ORDER — OMEPRAZOLE 20 MG PO CPDR: 20.0000 mg | DELAYED_RELEASE_CAPSULE | Freq: Every day | ORAL | Status: DC

## 2012-07-08 MED ORDER — TESTOSTERONE CYPIONATE 200 MG/ML IM SOLN: 200.0000 mg | INTRAMUSCULAR | Status: DC

## 2012-07-08 MED ORDER — TRAZODONE HCL 50 MG PO TABS: ORAL_TABLET | ORAL | Status: DC

## 2012-07-08 MED ORDER — LORAZEPAM 1 MG PO TABS: ORAL_TABLET | ORAL | Status: DC

## 2012-07-08 MED ORDER — PRAVASTATIN SODIUM 20 MG PO TABS: 40.0000 mg | ORAL_TABLET | Freq: Every day | ORAL | Status: DC

## 2012-07-08 NOTE — Progress Notes (Signed)
  Subjective:    Patient ID: Curtis Yang, male    DOB: May 02, 1960, 52 y.o.   MRN: [id number]  HPI    Review of Systems  Constitutional: Positive for unexpected weight change.  HENT: Positive for tinnitus.   Eyes: Negative.   Respiratory: Negative.   Cardiovascular: Negative.   Gastrointestinal: Negative.   Musculoskeletal: Positive for back pain.       Leg pain/heel pain  Skin: Negative.   Allergic/Immunologic: Negative.   Neurological: Positive for weakness and numbness.       Weakness and numbness of right leg  Hematological: Negative.   Psychiatric/Behavioral: Positive for sleep disturbance and decreased concentration. The patient is nervous/anxious.        Objective:   Physical Exam        Assessment & Plan:

## 2012-07-08 NOTE — Progress Notes (Signed)
[address]   [place], [place]  [address]   [phone]  Subjective:    Patient ID: Curtis Yang, male    DOB: 12-02-1960, 52 y.o.   MRN: [id number]  HPI This 52 y.o. male presents for complete physical exam.  Last physical 1 year ago.  Colonoscopy 05/01/2007. Claudette Head; due for repeat.   TDAP 10/18/2011. Influenza vaccine 01/24/12. Pneumovax never. Eye exam 2014; +glasses; no glaucoma or cataracts. Dental exam scheduled this week; every six months.  Lumbar pain with radiculopathy: s/p MRI with two bulging discs; s/p evaluation by [facility]; s/p epidural injection; scheduled for repeat next week.  Review of Systems  Constitutional: Positive for activity change and unexpected weight change. Negative for fever, chills, diaphoresis, appetite change and fatigue.  HENT: Positive for hearing loss, nosebleeds and tinnitus. Negative for ear pain, congestion, sore throat, facial swelling, rhinorrhea, sneezing, drooling, mouth sores, trouble swallowing, neck pain, neck stiffness, dental problem, voice change, postnasal drip and sinus pressure.   Eyes: Negative for photophobia, pain, discharge, redness, itching and visual disturbance.  Respiratory: Negative for apnea, cough, choking, chest tightness, shortness of breath, wheezing and stridor.   Cardiovascular: Negative for chest pain and leg swelling.  Gastrointestinal: Negative for nausea, vomiting, abdominal pain, diarrhea, constipation, blood in stool, abdominal distention, anal bleeding and rectal pain.  Endocrine: Negative for cold intolerance, heat intolerance, polydipsia, polyphagia and polyuria.  Genitourinary: Negative for dysuria, urgency, frequency, hematuria, flank pain, decreased urine volume, discharge, penile swelling, scrotal swelling, enuresis, difficulty urinating, genital sores, penile pain and testicular pain.  Musculoskeletal: Positive for back pain. Negative for myalgias, joint swelling, arthralgias and gait problem.    Skin: Negative for color change, pallor, rash and wound.  Allergic/Immunologic: Negative for environmental allergies, food allergies and immunocompromised state.  Neurological: Positive for numbness. Negative for dizziness, tremors, seizures, syncope, facial asymmetry, speech difficulty, weakness, light-headedness and headaches.  Hematological: Negative for adenopathy. Does not bruise/bleed easily.  Psychiatric/Behavioral: Positive for sleep disturbance and decreased concentration. Negative for suicidal ideas, hallucinations, behavioral problems, confusion, self-injury, dysphoric mood and agitation. The patient is nervous/anxious. The patient is not hyperactive.     Past Medical History  Diagnosis Date  . Hypogonadism male   . Hyperlipidemia   . Depression   . Insomnia   . Benign skin lesion of nose   . Increased serum lipids   . Low back pain   . Erectile dysfunction     Due to decreased testosterone   . Increased glucose level   . Low testosterone   . History of colon polyps   . Anxiety   . Arthritis     DDD lumbar.    Past Surgical History  Procedure Laterality Date  . Eye surgery  02/28/2000    Lasik  . Colonoscopy w/ polypectomy  05/03/2007    five polyps; repeat in 5 years; Claudette [facility].    Prior to Admission medications   Medication Sig Start Date End Date Taking? Authorizing Provider  amitriptyline (ELAVIL) 50 MG tablet Take 1 tablet (50 mg total) by mouth at bedtime. 04/04/12  Yes Anders Simmonds, PA-C  aspirin 81 MG tablet Take 81 mg by mouth daily.    Yes Historical Provider, MD  LORazepam (ATIVAN) 1 MG tablet 1/2-1 tid prn anxiety 07/08/12  Yes Ethelda Chick, MD  methocarbamol (ROBAXIN) 500 MG tablet Take 1 tablet (500 mg total) by mouth 3 (three) times daily. Prn spasm 05/15/12  Yes Anders Simmonds, PA-C  naproxen (  NAPROSYN) 500 MG tablet Take 1 tablet (500 mg total) by mouth 2 (two) times daily with a meal. 02/24/12  Yes Sherren Mocha, MD  omeprazole (PRILOSEC) 20 MG  capsule Take 1 capsule (20 mg total) by mouth daily. 07/08/12 07/08/13 Yes Ethelda Chick, MD  pravastatin (PRAVACHOL) 20 MG tablet Take 2 tablets (40 mg total) by mouth daily. At bedtime 07/08/12 07/08/13 Yes Ethelda Chick, MD  SYRINGE-NEEDLE, DISP, 3 ML 22G X 1-1/2" 3 ML MISC Use to self-administer testosterone 07/08/12  Yes Ethelda Chick, MD  testosterone cypionate (DEPOTESTOTERONE CYPIONATE) 200 MG/ML injection Inject 2 mLs (400 mg total) into the muscle every 14 (fourteen) days. 07/08/12  Yes Ethelda Chick, MD  traZODone (DESYREL) 50 MG tablet 1-2 tablet po qhs PRN insomnia 07/08/12   Ethelda Chick, MD    No Known Allergies  History   Social History  . Marital Status: Married    Spouse Name: N/A    Number of Children: N/A  . Years of Education: N/A   Occupational History  . Not on file.   Social History Main Topics  . Smoking status: Never Smoker   . Smokeless tobacco: Not on file  . Alcohol Use: No  . Drug Use: No  . Sexually Active: Yes     Comment: number of sex partners in the last 12 months  1   Other Topics Concern  . Not on file   Social History Narrative   Marital status:  Married x 15 years; happily married; second marriage; first wife died AMI age 60.        Children:  1 child; 3 stepchildren.  Several grandchildren.      Lives: with wife.      Employment:  [employer]; Retail banker x 15 years; happy.      Tobacco: never       Alcohol:  None      Drugs: none      Exercise:  Walking twice weekly.      Seatbelt: 100%      Guns: yes; unloaded; in safe.      Sunscreen: SPF on face.    Family History  Problem Relation Age of Onset  . Heart disease Maternal Grandmother   . Hypertension Maternal Grandmother   . Stroke Maternal Grandfather   . Anxiety disorder Mother   . Cancer Paternal Grandfather     prostate  . Anxiety disorder Brother   . Stroke Paternal Grandmother        Objective:   Physical Exam  Nursing note and vitals  reviewed. Constitutional: He is oriented to person, place, and time. He appears well-developed and well-nourished. No distress.  HENT:  Head: Normocephalic and atraumatic.  Right Ear: External ear normal.  Left Ear: External ear normal.  Nose: Nose normal.  Mouth/Throat: Oropharynx is clear and moist.  Eyes: Conjunctivae and EOM are normal. Pupils are equal, round, and reactive to light.  Neck: Normal range of motion. Neck supple. No JVD present. No thyromegaly present.  Cardiovascular: Normal rate, regular rhythm, normal heart sounds and intact distal pulses.  Exam reveals no gallop and no friction rub.   No murmur heard. Pulmonary/Chest: Effort normal and breath sounds normal. He has no wheezes. He has no rales.  Abdominal: Soft. Bowel sounds are normal. He exhibits no distension and no mass. There is no tenderness. There is no rebound and no guarding. Hernia confirmed negative in the right inguinal area and confirmed negative in  the left inguinal area.  Genitourinary: Rectum normal, prostate normal, testes normal and penis normal.  Musculoskeletal:       Right shoulder: Normal.       Left shoulder: Normal.       Cervical back: Normal.  Lymphadenopathy:    He has no cervical adenopathy.       Right: No inguinal adenopathy present.       Left: No inguinal adenopathy present.  Neurological: He is alert and oriented to person, place, and time. He has normal reflexes. No cranial nerve deficit. He exhibits normal muscle tone. Coordination normal.  Skin: Skin is warm and dry. No rash noted. He is not diaphoretic. No erythema. No pallor.  Psychiatric: He has a normal mood and affect. His behavior is normal. Judgment and thought content normal.   EKG: sinus tach at 102; no ST changes.    Assessment & Plan:  Routine general medical examination at a health care facility - Plan: POCT urinalysis dipstick, CBC with Differential, Comprehensive metabolic panel, Lipid panel, Hemoglobin A1c, PSA,  Vitamin B12, Vitamin D 25 hydroxy, EKG 12-Lead, IFOBT POC (occult bld, rslt in office), Testosterone  1.  CPE: Anticipatory guidance --- exercise, weight maintenance.  To schedule repeat colonoscopy. Immunizations UTD.  Obtain labs. 2. Insomnia: uncontrolled; rx for Trazodone 50mg  provided.  Likely secondary to underlying anxiety disorder. 3. 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  Will need to monitor regularly at home.  Meds ordered this encounter  Medications  . traZODone (DESYREL) 50 MG tablet    Sig: 1-2 tablet po qhs PRN insomnia    Dispense:  60 tablet    Refill:  5  . DISCONTD: testosterone cypionate (DEPOTESTOTERONE CYPIONATE) 200 MG/ML injection    Sig: Inject 1 mL (200 mg total) into the muscle every 14 (fourteen) days.    Dispense:  10 mL    Refill:  5  . SYRINGE-NEEDLE, DISP, 3 ML 22G X 1-1/2" 3 ML MISC    Sig: Use to self-administer testosterone    Dispense:  50 each    Refill:  1  . pravastatin (PRAVACHOL) 20 MG tablet    Sig: Take 2 tablets (40 mg total) by mouth daily. At bedtime    Dispense:  180 tablet    Refill:  3    Order Specific Question:  Supervising Provider    Answer:  DOOLITTLE, ROBERT P [[id number]]  . omeprazole (PRILOSEC) 20 MG capsule    Sig: Take 1 capsule (20 mg total) by mouth daily.    Dispense:  90 capsule    Refill:  1    Order Specific Question:  Supervising Provider    Answer:  DOOLITTLE, ROBERT P [[id number]]  . DISCONTD: LORazepam (ATIVAN) 1 MG tablet    Sig: 1/2-1 tid prn anxiety    Dispense:  90 tablet    Refill:  5    Order Specific Question:  Supervising Provider    Answer:  DOOLITTLE, ROBERT P [[id number]]  . testosterone cypionate (DEPOTESTOTERONE CYPIONATE) 200 MG/ML injection    Sig: Inject 2 mLs (400 mg total) into the muscle every 14 (fourteen) days.    Dispense:  10 mL    Refill:  5  . DISCONTD: LORazepam (ATIVAN) 1 MG tablet    Sig: 1/2-1 tid prn anxiety    Dispense:  90 tablet    Refill:  5     Order Specific Question:  Supervising Provider    Answer:  Ellamae Sia  P [[id number]]

## 2012-07-09 LAB — VITAMIN D 25 HYDROXY (VIT D DEFICIENCY, FRACTURES): Vit D, 25-Hydroxy: 37 ng/mL (ref 30–89)

## 2012-07-17 ENCOUNTER — Telehealth: Payer: Self-pay

## 2012-07-17 ENCOUNTER — Ambulatory Visit: Payer: Self-pay | Admitting: Physician Assistant

## 2012-07-17 NOTE — Telephone Encounter (Signed)
Notes Recorded by Ethelda Chick, MD on 07/16/2012 at 4:17 PM Call ---- 1. No evidence of anemia. 2. Blood sugar normal. 3. Liver and kidney functions normal. 4. Cholesterol under excellent control. 5. Vitamin B12 and Vitamin D levels normal. 6. PSA level normal. 7. Testosterone elevated again; no change in therapy at this time; if testosterone level still elevated at next visit, will need to adjust medication dose. 8. Urine normal. 9. No evidence of blood in stool. 10. Please mail copy of labs to patient if he would like one.      Patient advised.

## 2012-07-17 NOTE — Telephone Encounter (Signed)
PT is returning a phone call about his results Call back number is [phone]

## 2012-08-08 ENCOUNTER — Ambulatory Visit (INDEPENDENT_AMBULATORY_CARE_PROVIDER_SITE_OTHER): Payer: BC Managed Care – PPO | Admitting: Emergency Medicine

## 2012-08-08 VITALS — BP 136/104 | HR 122 | Temp 98.0°F | Resp 16 | Ht 73.0 in | Wt 218.0 lb

## 2012-08-08 DIAGNOSIS — R079 Chest pain, unspecified: Secondary | ICD-10-CM

## 2012-08-08 DIAGNOSIS — M549 Dorsalgia, unspecified: Secondary | ICD-10-CM

## 2012-08-08 DIAGNOSIS — F411 Generalized anxiety disorder: Secondary | ICD-10-CM

## 2012-08-08 DIAGNOSIS — R0989 Other specified symptoms and signs involving the circulatory and respiratory systems: Secondary | ICD-10-CM

## 2012-08-08 DIAGNOSIS — R0609 Other forms of dyspnea: Secondary | ICD-10-CM

## 2012-08-08 DIAGNOSIS — R0683 Snoring: Secondary | ICD-10-CM

## 2012-08-08 MED ORDER — LORAZEPAM 1 MG PO TABS: ORAL_TABLET | ORAL | Status: DC

## 2012-08-08 MED ORDER — FLUTICASONE PROPIONATE 50 MCG/ACT NA SUSP: 2.0000 | Freq: Every day | NASAL | Status: DC

## 2012-08-08 NOTE — Progress Notes (Signed)
  Subjective:    Patient ID: Curtis Yang, male    DOB: 1960/08/23, 52 y.o.   MRN: [id number]  HPI patient here to followup on recent visit to [facility] emergency room. He was evaluated for right-sided chest pain. D-dimer test and troponin were within normal limits. He was felt to have had an anxiety attack. He has been under a lot of stress recently and undergoing treatment for a severe low back problem which may require surgery. His wife states he is also having a significant problem with snoring .    Review of Systems     Objective:   Physical Exam HEENT exam is unremarkable neck supple chest clear         Assessment & Plan:  I've increased his Ativan to 3 times a day I have scheduled him to see Dr. Elease Hashimoto for reevaluation of his chest pain. I have made an appointment for him to have a sleep study.

## 2012-08-11 ENCOUNTER — Other Ambulatory Visit: Payer: Self-pay | Admitting: Family Medicine

## 2012-08-20 ENCOUNTER — Other Ambulatory Visit: Payer: Self-pay | Admitting: Physician Assistant

## 2012-08-23 ENCOUNTER — Other Ambulatory Visit: Payer: Self-pay | Admitting: Physician Assistant

## 2012-09-08 ENCOUNTER — Other Ambulatory Visit: Payer: Self-pay | Admitting: Physician Assistant

## 2012-09-15 ENCOUNTER — Other Ambulatory Visit: Payer: Self-pay | Admitting: Physician Assistant

## 2012-09-20 ENCOUNTER — Encounter: Payer: Self-pay | Admitting: Neurology

## 2012-09-26 HISTORY — PX: SPINE SURGERY: SHX786

## 2012-10-06 ENCOUNTER — Other Ambulatory Visit: Payer: Self-pay | Admitting: Family Medicine

## 2012-10-07 ENCOUNTER — Ambulatory Visit (INDEPENDENT_AMBULATORY_CARE_PROVIDER_SITE_OTHER): Payer: BC Managed Care – PPO | Admitting: Emergency Medicine

## 2012-10-07 VITALS — BP 130/72 | HR 127 | Temp 97.9°F | Resp 16 | Ht 73.0 in | Wt 231.0 lb

## 2012-10-07 DIAGNOSIS — M549 Dorsalgia, unspecified: Secondary | ICD-10-CM

## 2012-10-07 MED ORDER — OXYCODONE HCL 5 MG PO CAPS: 5.0000 mg | ORAL_CAPSULE | ORAL | Status: DC | PRN

## 2012-10-07 NOTE — Progress Notes (Signed)
  Subjective:    Patient ID: Curtis Yang, male    DOB: 1960-03-29, 52 y.o.   MRN: [id number]  HPI 52 YO male patient here as a follow up from his back surgery 09/26/12. He had his right L5-S1 decompression at [facility] in [facility]. He has a history of a pinched nerve at his L4 and L5. He spent one night in the hospital. He is still in pain. The surgeon said it could take up to six months before he finds complete relief.   He needs a refill of his pain medication- Oxycodone 5, takes 1-2 every 4 hours. He has also uses ice every 2 hours for swelling. He also uses a back brace but does not have it with him today.  He needs his wound checked.  He is also complaining of Right leg pain that radiates to his right foot.     Review of Systems     Objective:   Physical Exam the incisional site over L5-S1 is about 2 inches lateral to the midline of the spine. There is no redness in this area there is no ecchymosis in this area. There's her sugars over the incisional site Deep tendon reflexes of knees and ankles are trace. He does have pain on straight leg raising of the right leg at about 80 to       Assessment & Plan:  The patient with persistent pain [facility]. I did refill his pain medications. He already has an appointment to see Dr. Katrinka Blazing on next Monday .

## 2012-10-10 ENCOUNTER — Ambulatory Visit: Payer: BC Managed Care – PPO | Admitting: Cardiovascular Disease

## 2012-10-14 ENCOUNTER — Encounter: Payer: Self-pay | Admitting: Family Medicine

## 2012-10-14 ENCOUNTER — Ambulatory Visit (INDEPENDENT_AMBULATORY_CARE_PROVIDER_SITE_OTHER): Payer: BC Managed Care – PPO | Admitting: Family Medicine

## 2012-10-14 VITALS — BP 144/80 | HR 107 | Temp 98.2°F | Resp 18 | Ht 72.0 in | Wt 239.0 lb

## 2012-10-14 DIAGNOSIS — M549 Dorsalgia, unspecified: Secondary | ICD-10-CM

## 2012-10-14 DIAGNOSIS — G4700 Insomnia, unspecified: Secondary | ICD-10-CM

## 2012-10-14 DIAGNOSIS — L309 Dermatitis, unspecified: Secondary | ICD-10-CM

## 2012-10-14 DIAGNOSIS — L259 Unspecified contact dermatitis, unspecified cause: Secondary | ICD-10-CM

## 2012-10-14 DIAGNOSIS — F411 Generalized anxiety disorder: Secondary | ICD-10-CM

## 2012-10-14 DIAGNOSIS — E291 Testicular hypofunction: Secondary | ICD-10-CM

## 2012-10-14 MED ORDER — LORAZEPAM 1 MG PO TABS: ORAL_TABLET | ORAL | Status: DC

## 2012-10-14 MED ORDER — TRIAMCINOLONE ACETONIDE 0.1 % EX CREA: TOPICAL_CREAM | Freq: Two times a day (BID) | CUTANEOUS | Status: DC

## 2012-10-14 MED ORDER — OXYCODONE HCL 5 MG PO CAPS: 5.0000 mg | ORAL_CAPSULE | ORAL | Status: DC | PRN

## 2012-10-14 NOTE — Progress Notes (Signed)
[address]   [place], [place]  [address]   [phone]  Subjective:    Patient ID: Curtis Yang, male    DOB: 1960-10-18, 52 y.o.   MRN: [id number]  HPI This 52 y.o. male presents for evaluation of the following:  1.  DDD lumbar: s/p lumbar surgery on 09/26/12 at L4-5 [facility] [place] [facility].  Remained in [place] for one week; drove down.  Came in one week ago due to persistent RLE radicular pain; had run out of medication; evaluated by Dr. Cleta Alberts; prescribed medication; has suffered with a few bad days since.  Today, pain is better.  Taking oxycodone one tablet every morning and then one in the evening.  Started yesterday on exercises to perform; also gave DVD by physical therapist.  To undergo PT for six weeks.  Hoping to go back to work in one week.  Expected return to work is two weeks.  Work can be strenuous; will be on light duty for several weeks; has documentation from Careers adviser.  Employer treats like a son.  Talked to surgeon last week; described persistent pain; due to hip anatomy, difficult to insert surgical equipment; had to manipulate nerve which was challenging due to long term compression. Every day should get a little better.  Gave a muscle relaxer/Robaxin which does not help.  Surgeon prescribed different muscle relaxer which is more helpful; taking new muscle relaxer as needed; usually twice daily.  Cyclobenzaprine 5mg .  Intermittent numbness in posterior thigh.  Heel pain much improved since surgery.  Wearing back brace for four weeks mostly for activity; wants walking twice daily; wants back brace in place. With rest, to take back brace off.  Wants incision evaluated.  Wife has changing bandage every 3-4 days.  Taking muscle relaxer before lunch and one in the evening.  No saddle paresthesias; normal b/b function.    2.  Chest pain: s/p evaluation at [facility] in 07/2012; major work stressors during that time period; s/p evaluation in ED; s/p troponin and D-dimer that were  negative.  Diagnosed with stress.  Follow-up with Dr. Cleta Alberts at 102; increased Ativan to tid but patient was already taking tid Ativan 1mg ; never filled rx; did not want rx from multiple providers.  No recurrence.  Dr. Cleta Alberts referred back to cardiology.  3.  Elevated blood pressure: BP ranged 120-140 during surgery lumbar in [place].  BP in ED for chest pain running 170s but ED physician not concerned.    4. Anxiety: s/p evaluation by Dr. Cleta Alberts in 07/2012 for anxiety related chest pain; prescribed Ativan 1mg  tid but that was patient's baseline dose; taking Ativan 1mg   One every morning and one after lunch and one around 6:00pm.   Emotionally stable since lumbar surgery; has suffered with a lot of pain but stable.    5. Insomnia:  Three month follow-up; management at last visit included adding Trazodone; Trazodone 50mg  one at bedtime working relatively well.  Has gained weight and worried that a medication side effect.  6. Snoring: Dr. Cleta Alberts referred patient for a sleep study in 07/2012.  Insurance does not cover sleep study.  Has gained weight in past three months.  7. Hypogonadism: testosterone level elevated at last visit; two last testosterone levels elevated; administering 2 ml every two weeks; agreeable to decreasing dose due to concerns of testosterone supplementation and risks of therapy.  Would like to repeat today.  8. Rash LLE medial aspect of shin: chronic recurrent itching along lower  extremity; +dryness and scaling of skin; +redness now.  Scratching a lot.   Review of Systems  Constitutional: Positive for unexpected weight change. Negative for fever, chills, diaphoresis and fatigue.  Respiratory: Negative for shortness of breath, wheezing and stridor.   Cardiovascular: Negative for chest pain, palpitations and leg swelling.  Gastrointestinal: Negative for nausea, vomiting, abdominal pain, diarrhea and constipation.  Endocrine: Negative for cold intolerance, heat intolerance, polydipsia,  polyphagia and polyuria.  Musculoskeletal: Positive for myalgias, back pain and gait problem.  Neurological: Positive for numbness. Negative for dizziness, tremors, syncope, weakness, light-headedness and headaches.  Psychiatric/Behavioral: Positive for sleep disturbance. Negative for suicidal ideas, self-injury and dysphoric mood. The patient is nervous/anxious.    Past Medical History  Diagnosis Date  . Hypogonadism male   . Hyperlipidemia   . Depression   . Insomnia   . Benign skin lesion of nose   . Increased serum lipids   . Low back pain   . Erectile dysfunction     Due to decreased testosterone   . Increased glucose level   . Low testosterone   . History of colon polyps   . Anxiety   . Arthritis     DDD lumbar.  . Previous back surgery    Past Surgical History  Procedure Laterality Date  . Eye surgery  02/28/2000    Lasik  . Colonoscopy w/ polypectomy  05/03/2007    five polyps; repeat in 5 years; Claudette Head.  [name] Spine surgery  09/26/2012    [facility] [place].   No Known Allergies Current Outpatient Prescriptions on File Prior to Visit  Medication Sig Dispense Refill  . aspirin 81 MG tablet Take 81 mg by mouth daily.       . fluticasone (FLONASE) 50 MCG/ACT nasal spray Place 2 sprays into the nose daily.  16 g  6  . methocarbamol (ROBAXIN) 500 MG tablet Take 1 tablet (500 mg total) by mouth 3 (three) times daily. Prn spasm  90 tablet  0  . methocarbamol (ROBAXIN) 500 MG tablet TAKE 1 TABLET BY MOUTH FOUR TIMES DAILY  60 tablet  0  . naproxen (NAPROSYN) 500 MG tablet TAKE 1 TABLET BY MOUTH TWICE DAILY WITH A MEAL  60 tablet  0  . omeprazole (PRILOSEC) 20 MG capsule Take 1 capsule (20 mg total) by mouth daily.  90 capsule  1  . pravastatin (PRAVACHOL) 20 MG tablet Take 2 tablets (40 mg total) by mouth daily. At bedtime  180 tablet  3  . SYRINGE-NEEDLE, DISP, 3 ML 22G X 1-1/2" 3 ML MISC Use to self-administer testosterone  50 each  1  . testosterone  cypionate (DEPOTESTOTERONE CYPIONATE) 200 MG/ML injection Inject 2 mLs (400 mg total) into the muscle every 14 (fourteen) days.  10 mL  5  . traZODone (DESYREL) 50 MG tablet 1-2 tablet po qhs PRN insomnia  60 tablet  5   Current Facility-Administered Medications on File Prior to Visit  Medication Dose Route Frequency Provider Last Rate Last Dose  . testosterone cypionate (DEPOTESTOTERONE CYPIONATE) injection 400 mg  400 mg Intramuscular Q14 Days Pattricia Boss, PA-C   400 mg at 10/01/11 1610   History   Social History  . Marital Status: Married    Spouse Name: N/A    Number of Children: N/A  . Years of Education: N/A   Occupational History  . Not on file.   Social History Main Topics  . Smoking status: Never Smoker   .  Smokeless tobacco: Not on file  . Alcohol Use: No  . Drug Use: No  . Sexual Activity: Yes     Comment: number of sex partners in the last 12 months  1   Other Topics Concern  . Not on file   Social History Narrative   Marital status:  Married x 15 years; happily married; second marriage; first wife died AMI age 17.        Children:  1 child; 3 stepchildren.  Several grandchildren.      Lives: with wife.      Employment:  [employer]; Retail banker x 15 years; happy.      Tobacco: never       Alcohol:  None      Drugs: none      Exercise:  Walking twice weekly.      Seatbelt: 100%      Guns: yes; unloaded; in safe.      Sunscreen: SPF on face.       Objective:   Physical Exam  Nursing note and vitals reviewed. Constitutional: He is oriented to person, place, and time. He appears well-developed and well-nourished. No distress.  HENT:  Head: Normocephalic and atraumatic.  Eyes: Conjunctivae and EOM are normal. Pupils are equal, round, and reactive to light.  Neck: Normal range of motion. Neck supple. No thyromegaly present.  Cardiovascular: Normal rate, regular rhythm and normal heart sounds.  Exam reveals no gallop and no friction rub.   No murmur  heard. Pulmonary/Chest: Effort normal and breath sounds normal. He has no wheezes. He has no rales.  Musculoskeletal:  Lumbar spine: straight leg raises negative; motor 5/5.  Lumbar incision without tenderness, erythema, drainage.  Lymphadenopathy:    He has no cervical adenopathy.  Neurological: He is alert and oriented to person, place, and time. No cranial nerve deficit or sensory deficit. He exhibits normal muscle tone. Coordination normal.  Skin: Skin is warm and dry. He is not diaphoretic.  Lumbar incision well healed without erythema, drainage; mild induration; good approximation of wound.  Steri strips x 3 intact. LLE: +scant scaling rash with petechia along area of dryness.  No induration; no vesicles or pustules.  Psychiatric: He has a normal mood and affect. His behavior is normal. Judgment and thought content normal.       Assessment & Plan:  Anxiety state, unspecified - Plan: LORazepam (ATIVAN) 1 MG tablet  Back pain - Plan: oxycodone (OXY-IR) 5 MG capsule  Hypogonadism male - Plan: TESTOSTERONE, TOTAL, LC/MS  Insomnia  Dermatitis   1. Anxiety:  Stable at this time; refill of Ativan 1mg  tid #90 5 refills.   2.  DDD Lumbar spine:  New to this provider; s/p lumbar surgery in [place]; pain slowly improving; refill of Oxycodone 5mg  bid PRN #40 no refills; continue home PT; returning to work next week on light duty.  Continue Miralax daily.  Advised to avoid driving with oxycodone; pt expressed understanding. 3.  Hypogonadism: over-corrected; repeat testosterone level today; if remains elevated, will decrease dose to 1.38ml every two weeks.   4. Insomnia: improved with Trazodone 50mg  one tablet qhs.  5.  Dermatitis LLE:  New.  Rx for Trazodone provided. 6. Chest pain: New. S/p [facility] ED visit; diagnosed with panic attack/anxiety.  Improved; no recurrence. 7. Snoring: persistent; insurance does not cover sleep study. 8. Elevated BP: stable today; history of white coat syndrome.     Meds ordered this encounter  Medications  . fish oil-omega-3 fatty acids  1000 MG capsule    Sig: Take 2 g by mouth daily.  . Multiple Vitamin (MULTIVITAMIN) tablet    Sig: Take 1 tablet by mouth daily.  [name] LORazepam (ATIVAN) 1 MG tablet    Sig: 1/2-1 tid prn anxiety    Dispense:  90 tablet    Refill:  5    Order Specific Question:  Supervising Provider    Answer:  DOOLITTLE, ROBERT P [[id number]]  . oxycodone (OXY-IR) 5 MG capsule    Sig: Take 1 capsule (5 mg total) by mouth every 4 (four) hours as needed.    Dispense:  40 capsule    Refill:  0  . triamcinolone cream (KENALOG) 0.1 %    Sig: Apply topically 2 (two) times daily.    Dispense:  45 g    Refill:  1

## 2012-10-15 LAB — TESTOSTERONE: Testosterone: 1194 ng/dL — ABNORMAL HIGH (ref 300–890)

## 2012-10-22 ENCOUNTER — Telehealth: Payer: Self-pay

## 2012-10-22 NOTE — Telephone Encounter (Signed)
PT STATES DR Katrinka Blazing WANTED HIM TO CALL IF HE NEEDED TO. HAD BACK SURGERY AND IS IN A LOT OF PAIN, NEED TO HAVE SOME MORE PAIN MEDICINE BUT NOT THE OXYCODONE, BUT THE HYDROCODONE WILL BE BETTER. PLEASE CALL [phone]   [facility] [address]

## 2012-10-22 NOTE — Telephone Encounter (Signed)
Please advise 

## 2012-10-23 MED ORDER — HYDROCODONE-ACETAMINOPHEN 5-325 MG PO TABS: 1.0000 | ORAL_TABLET | Freq: Four times a day (QID) | ORAL | Status: DC | PRN

## 2012-10-23 NOTE — Telephone Encounter (Signed)
Please call --- I have sent in rx for Hydrocodone to pharmacy.  He should not take Hydrocodone with Oxycodone.

## 2012-10-23 NOTE — Telephone Encounter (Signed)
Pt advised.

## 2012-10-28 ENCOUNTER — Other Ambulatory Visit: Payer: Self-pay | Admitting: Family Medicine

## 2012-10-28 ENCOUNTER — Other Ambulatory Visit: Payer: Self-pay | Admitting: Physician Assistant

## 2012-10-29 ENCOUNTER — Other Ambulatory Visit: Payer: Self-pay | Admitting: Family Medicine

## 2012-11-01 ENCOUNTER — Other Ambulatory Visit: Payer: Self-pay | Admitting: Family Medicine

## 2012-11-10 ENCOUNTER — Other Ambulatory Visit: Payer: Self-pay | Admitting: Family Medicine

## 2012-11-12 ENCOUNTER — Other Ambulatory Visit: Payer: Self-pay | Admitting: Family Medicine

## 2012-11-13 ENCOUNTER — Other Ambulatory Visit: Payer: Self-pay | Admitting: Family Medicine

## 2012-11-13 NOTE — Telephone Encounter (Signed)
Please call in rx to pharmacy

## 2012-11-14 NOTE — Telephone Encounter (Signed)
Called in Rx

## 2012-11-30 ENCOUNTER — Other Ambulatory Visit: Payer: Self-pay | Admitting: Family Medicine

## 2012-12-01 ENCOUNTER — Other Ambulatory Visit: Payer: Self-pay | Admitting: Family Medicine

## 2012-12-03 NOTE — Telephone Encounter (Signed)
OK to call in/phone in refill of hydrocodone to pharmacy.

## 2012-12-04 NOTE — Telephone Encounter (Signed)
Patient checking on the status of his med request on 11/30/12  [phone]

## 2012-12-05 MED ORDER — HYDROCODONE-ACETAMINOPHEN 5-325 MG PO TABS: 1.0000 | ORAL_TABLET | Freq: Four times a day (QID) | ORAL | Status: DC | PRN

## 2012-12-05 NOTE — Telephone Encounter (Signed)
Thank you for advising patient of new guidelines; I actually forgot new guidelines when approving medication; will sign this afternoon.

## 2012-12-05 NOTE — Addendum Note (Signed)
Addended by: Sheppard Plumber A on: 12/05/2012 09:57 AM   Modules accepted: Orders

## 2012-12-05 NOTE — Telephone Encounter (Signed)
Fed laws have changed for this med and now has to be printed and picked up and taken to pharmacy by pt. I have printed off the Rx and notified pt on VM of status and that we will call after Rx signed and ready. I have placed Rx in Dr [facility] box for signature.

## 2012-12-06 NOTE — Telephone Encounter (Signed)
Pt p/up Rx 12/06/12.

## 2012-12-08 ENCOUNTER — Other Ambulatory Visit: Payer: Self-pay | Admitting: Family Medicine

## 2013-01-05 ENCOUNTER — Other Ambulatory Visit: Payer: Self-pay | Admitting: Family Medicine

## 2013-01-20 ENCOUNTER — Other Ambulatory Visit: Payer: Self-pay | Admitting: Family Medicine

## 2013-01-20 ENCOUNTER — Encounter: Payer: Self-pay | Admitting: Family Medicine

## 2013-01-20 ENCOUNTER — Ambulatory Visit (INDEPENDENT_AMBULATORY_CARE_PROVIDER_SITE_OTHER): Payer: BC Managed Care – PPO | Admitting: Family Medicine

## 2013-01-20 VITALS — BP 124/86 | HR 77 | Temp 97.9°F | Resp 16 | Ht 72.5 in | Wt 215.0 lb

## 2013-01-20 DIAGNOSIS — E7800 Pure hypercholesterolemia, unspecified: Secondary | ICD-10-CM

## 2013-01-20 DIAGNOSIS — F411 Generalized anxiety disorder: Secondary | ICD-10-CM

## 2013-01-20 DIAGNOSIS — F419 Anxiety disorder, unspecified: Secondary | ICD-10-CM

## 2013-01-20 DIAGNOSIS — M545 Low back pain: Secondary | ICD-10-CM

## 2013-01-20 DIAGNOSIS — G4700 Insomnia, unspecified: Secondary | ICD-10-CM

## 2013-01-20 DIAGNOSIS — E291 Testicular hypofunction: Secondary | ICD-10-CM

## 2013-01-20 DIAGNOSIS — Z23 Encounter for immunization: Secondary | ICD-10-CM

## 2013-01-20 DIAGNOSIS — M5136 Other intervertebral disc degeneration, lumbar region: Secondary | ICD-10-CM

## 2013-01-20 LAB — CBC WITH DIFFERENTIAL/PLATELET
Basophils Absolute: 0 10*3/uL (ref 0.0–0.1)
Basophils Relative: 0 % (ref 0–1)
Eosinophils Absolute: 0.1 10*3/uL (ref 0.0–0.7)
Hemoglobin: 15 g/dL (ref 13.0–17.0)
MCH: 30.4 pg (ref 26.0–34.0)
MCHC: 34.6 g/dL (ref 30.0–36.0)
Monocytes Absolute: 0.5 10*3/uL (ref 0.1–1.0)
Monocytes Relative: 9 % (ref 3–12)
Neutrophils Relative %: 56 % (ref 43–77)
RDW: 13.9 % (ref 11.5–15.5)

## 2013-01-20 LAB — LIPID PANEL
Cholesterol: 203 mg/dL — ABNORMAL HIGH (ref 0–200)
HDL: 70 mg/dL (ref >39)
Triglycerides: 88 mg/dL (ref <150)
VLDL: 18 mg/dL (ref 0–40)

## 2013-01-20 LAB — COMPREHENSIVE METABOLIC PANEL
AST: 21 U/L (ref 0–37)
Albumin: 4.5 g/dL (ref 3.5–5.2)
BUN: 13 mg/dL (ref 6–23)
Calcium: 10 mg/dL (ref 8.4–10.5)
Chloride: 103 mEq/L (ref 96–112)
Glucose, Bld: 93 mg/dL (ref 70–99)
Potassium: 4.1 mEq/L (ref 3.5–5.3)
Sodium: 139 mEq/L (ref 135–145)
Total Protein: 7 g/dL (ref 6.0–8.3)

## 2013-01-20 MED ORDER — HYDROCODONE-ACETAMINOPHEN 5-325 MG PO TABS: 1.0000 | ORAL_TABLET | Freq: Four times a day (QID) | ORAL | Status: DC | PRN

## 2013-01-20 MED ORDER — TESTOSTERONE CYPIONATE 200 MG/ML IM SOLN: 300.0000 mg | INTRAMUSCULAR | Status: DC

## 2013-01-20 MED ORDER — ZOSTER VACCINE LIVE 19400 UNT/0.65ML ~~LOC~~ SOLR: 0.6500 mL | Freq: Once | SUBCUTANEOUS | Status: DC

## 2013-01-20 NOTE — Progress Notes (Signed)
Subjective:    Patient ID: Curtis Yang, male    DOB: Apr 27, 1960, 52 y.o.   MRN: [id number]  Hyperlipidemia Associated symptoms include myalgias. Pertinent negatives include no chest pain or shortness of breath.  Anxiety Patient reports no chest pain, dizziness, nausea, nervous/anxious behavior, palpitations, shortness of breath or suicidal ideas.     This 52 y.o. male presents for three month follow-up:  1.  DDD lumbar:  Three month follow-up; s/p lumbar surgery in 08/2012.  Last hydrocodone rx picked up 12/05/2012.  Now sleeping through the night without pain.  Surgery not a total success.  Must really watch how lifting at work; definitely an improvement pre-surgery; 65% improved from previous pain.  Having two bad days per week; will need to apply ice after work twice weekly.  Has two remaining bulging disc; trying to avoid surgery for now.  Decompressed nerve with surgery.   Communicating via email with surgeon.  Pain isolated to lower R location; having R buttocks and R leg pain.  Able to work longer without pain.  Prolonged standing and repetition will trigger.  No n/t in leg.  No weakness.  Taking hydrocodone as needed; has two pills remaining from 12/05/12 rx.  Had intense demanding job after last visit.   Thinks will only warrant one more hydrocodone rx.  2.  Hypogonadism: testosterone remained elevated at 09/2012 visit; recommended decreasing testosterone supplementation to 1.39ml every two weeks.   Administered testosterone injection yesterday.  3.  Hypercholesterolemia:  Due for six month follow-up.  Patient reports good compliance with medication, good tolerance to medication, and good symptom control.    4.  Paresthesias RUQ: onset since surgery.    5. Flu vaccine: requesting.    6. Insomnia:  Starting to become an issue.  Taking 1-2 Trazodone qhs; pain of lower back interferes with sleep.    7. Anxiety: stable currently despite ongoing back pain.   Compliance with  medication; no recent issues.   Review of Systems  Constitutional: Negative for fever, chills, diaphoresis and fatigue.  Respiratory: Negative for cough, shortness of breath, wheezing and stridor.   Cardiovascular: Negative for chest pain, palpitations and leg swelling.  Gastrointestinal: Negative for nausea, vomiting, abdominal pain, diarrhea, constipation and abdominal distention.  Genitourinary: Negative for frequency, decreased urine volume and difficulty urinating.  Musculoskeletal: Positive for back pain and myalgias. Negative for neck pain and neck stiffness.  Skin: Negative for rash.  Neurological: Negative for dizziness, tremors, seizures, syncope, facial asymmetry, speech difficulty, light-headedness, numbness and headaches.  Psychiatric/Behavioral: Positive for sleep disturbance. Negative for suicidal ideas, self-injury and dysphoric mood. The patient is not nervous/anxious.    Past Medical History  Diagnosis Date  . Hypogonadism male   . Hyperlipidemia   . Depression   . Insomnia   . Benign skin lesion of nose   . Increased serum lipids   . Low back pain   . Erectile dysfunction     Due to decreased testosterone   . Increased glucose level   . Low testosterone   . History of colon polyps   . Anxiety   . Arthritis     DDD lumbar.  . Previous back surgery   . GERD (gastroesophageal reflux disease)    No Known Allergies History   Social History  . Marital Status: Married    Spouse Name: N/A    Number of Children: N/A  . Years of Education: N/A   Occupational History  . Not on file.  Social History Main Topics  . Smoking status: Never Smoker   . Smokeless tobacco: Never Used  . Alcohol Use: No  . Drug Use: No  . Sexual Activity: Yes     Comment: number of sex partners in the last 12 months  1   Other Topics Concern  . Not on file   Social History Narrative   Marital status:  Married x 16 years; happily married; second marriage; first wife died AMI  age 39.        Children:  1 child; 3 stepchildren.  Several grandchildren.      Lives: with wife.      Employment:  [employer]; Librarian, academic x 16 years; happy.      Tobacco: never       Alcohol:  None      Drugs: none      Exercise:  Walking twice daily 3 miles total each day.        Seatbelt: 100%      Guns: yes; unloaded; in safe.      Sunscreen: SPF on face.   Family History  Problem Relation Age of Onset  . Heart disease Maternal Grandmother   . Hypertension Maternal Grandmother   . Stroke Maternal Grandfather   . Anxiety disorder Mother   . Cancer Paternal Grandfather     prostate  . Anxiety disorder Brother   . Stroke Paternal Grandmother        Objective:   Physical Exam  Nursing note and vitals reviewed. Constitutional: He is oriented to person, place, and time. He appears well-developed and well-nourished. No distress.  HENT:  Head: Normocephalic and atraumatic.  Mouth/Throat: Oropharynx is clear and moist.  Eyes: Conjunctivae and EOM are normal. Pupils are equal, round, and reactive to light.  Neck: Normal range of motion. Neck supple. Carotid bruit is not present. No thyromegaly present.  Cardiovascular: Normal rate, regular rhythm and normal heart sounds.  Exam reveals no gallop and no friction rub.   No murmur heard. Pulmonary/Chest: Effort normal and breath sounds normal. He has no wheezes. He has no rales. He exhibits no tenderness.  Abdominal: Soft. Bowel sounds are normal. He exhibits no distension. There is no tenderness. There is no rebound and no guarding.  Musculoskeletal:       Lumbar back: He exhibits normal range of motion, no tenderness, no pain and no spasm.  Lumbar spine: straight leg raises negative;   Lymphadenopathy:    He has no cervical adenopathy.  Neurological: He is alert and oriented to person, place, and time. He exhibits normal muscle tone.  Skin: No rash noted. He is not diaphoretic.  Psychiatric: He has a normal mood and  affect. His behavior is normal. Judgment and thought content normal.   FLU VACCINE ADMINISTERED.    Assessment & Plan:  Need for prophylactic vaccination and inoculation against influenza - Plan: Flu Vaccine QUAD 36+ mos IM  Pure hypercholesterolemia - Plan: Lipid panel, CBC with Differential, Comprehensive metabolic panel, CK  Hypogonadism male - Plan: Testosterone  Anxiety  Insomnia  Low back pain  DDD (degenerative disc disease), lumbar  1. Hypercholesterolemia: controlled; obtain labs; continue current medications. 2.  Hypogonadism: over-corrected at last visit; repeat labs; adjust Testosterone as indicated. 3.  Anxiety: controlled despite ongoing lower back pain.  Refills provided. 4.  Insomnia: worsening due to ongoing chronic lower back pain; refill of Trazodone provided. 5. DDD lumbar spine: s/p surgery with ongoing pain; followed closely by NS.  Rx for hydrocodone provided and decreasing intake gradually. 6. S/p flu vaccine.  Meds ordered this encounter  Medications  . testosterone cypionate (DEPOTESTOTERONE CYPIONATE) 200 MG/ML injection    Sig: Inject 1.5 mLs (300 mg total) into the muscle every 14 (fourteen) days.    Dispense:  10 mL    Refill:  5  . HYDROcodone-acetaminophen (NORCO/VICODIN) 5-325 MG per tablet    Sig: Take 1 tablet by mouth every 6 (six) hours as needed.    Dispense:  40 tablet    Refill:  0   Nilda Simmer, M.D.  [facility] [address] [place], [place]  [address] [phone] phone [phone] fax

## 2013-01-24 ENCOUNTER — Other Ambulatory Visit: Payer: Self-pay | Admitting: Radiology

## 2013-02-12 ENCOUNTER — Ambulatory Visit (INDEPENDENT_AMBULATORY_CARE_PROVIDER_SITE_OTHER): Payer: BC Managed Care – PPO | Admitting: Emergency Medicine

## 2013-02-12 VITALS — BP 140/86 | HR 106 | Temp 98.0°F | Resp 16 | Ht 72.0 in | Wt 206.4 lb

## 2013-02-12 DIAGNOSIS — Z9889 Other specified postprocedural states: Secondary | ICD-10-CM

## 2013-02-12 DIAGNOSIS — M549 Dorsalgia, unspecified: Secondary | ICD-10-CM

## 2013-02-12 MED ORDER — HYDROCODONE-ACETAMINOPHEN 5-325 MG PO TABS: 1.0000 | ORAL_TABLET | Freq: Four times a day (QID) | ORAL | Status: DC | PRN

## 2013-02-12 MED ORDER — PREDNISONE 20 MG PO TABS: ORAL_TABLET | ORAL | Status: DC

## 2013-02-12 MED ORDER — KETOROLAC TROMETHAMINE 60 MG/2ML IM SOLN
60.0000 mg | Freq: Once | INTRAMUSCULAR | Status: AC
  Administered 2013-02-12: 60 mg via INTRAMUSCULAR

## 2013-02-12 NOTE — Patient Instructions (Signed)
We are making an appointment to be seen with Dr. Murray Hodgkins

## 2013-02-12 NOTE — Progress Notes (Signed)
Subjective:    Patient ID: Curtis Yang, male    DOB: [birth year], 52 y.o.   MRN: [id number]  This chart was scribed for Curtis Chris, MD by Blanchard Kelch, ED Scribe. The patient was seen in room 5. Patient's care was started at 9:02 AM.   HPI  Curtis Yang is a 52 y.o. male with a history of a herniated disc on the right side who presents to office complaining of constant, severe back pain that suddenly began a week ago when he was getting Christmas boxes down from a high shelf in his garage. He states while he was grabbing one box, his right leg went numb and his lower back and buttocks started throbbing. The throbbing pain has been constant to his back and buttocks since the incident. He is also complaining of pain in his right heel. The pain is worsened by coughing and sneezing. He denies bowel or bladder incontinence.   His neurosurgeon is Dr. Anise Salvo in [place]. When he had an MRI done in April, he was seen by a Neurosurgeon at [place] but does not remember the name. He was given an Epidural injections by Dr. Murray Hodgkins after the MRI. He had laser surgery done in July 2014 for a different herniated disc in [place]. The pain he has now is similar to the pain he had before the surgery, except that his leg hurts less now than it did before.   He reports that taking Prednisone and Hydrocodone helps alleviate the pain.    Past Medical History  Diagnosis Date  . Hypogonadism male   . Hyperlipidemia   . Depression   . Insomnia   . Benign skin lesion of nose   . Increased serum lipids   . Low back pain   . Erectile dysfunction     Due to decreased testosterone   . Increased glucose level   . Low testosterone   . History of colon polyps   . Anxiety   . Arthritis     DDD lumbar.  . Previous back surgery    Past Surgical History  Procedure Laterality Date  . Eye surgery  02/28/2000    Lasik  . Colonoscopy w/ polypectomy  05/03/2007    five polyps; repeat in 5 years; Claudette Head.  [name] Spine surgery  09/26/2012    [facility] in [place].   Family History  Problem Relation Age of Onset  . Heart disease Maternal Grandmother   . Hypertension Maternal Grandmother   . Stroke Maternal Grandfather   . Anxiety disorder Mother   . Cancer Paternal Grandfather     prostate  . Anxiety disorder Brother   . Stroke Paternal Grandmother      Review of Systems  Constitutional: Negative for fever.  HENT: Negative for drooling.   Eyes: Negative for discharge.  Respiratory: Negative for cough.   Cardiovascular: Negative for leg swelling.  Gastrointestinal: Negative for vomiting.  Endocrine: Negative for polyuria.  Genitourinary: Negative for hematuria.  Musculoskeletal: Positive for back pain and myalgias. Negative for gait problem.  Skin: Negative for rash.  Allergic/Immunologic: Negative for immunocompromised state.  Neurological: Positive for numbness. Negative for speech difficulty.  Hematological: Negative for adenopathy.  Psychiatric/Behavioral: Negative for confusion.        Objective:   Physical Exam  Nursing note and vitals reviewed. General: Well-developed, well-nourished male in no acute distress; appearance consistent with age of record HENT: normocephalic; atraumatic Eyes: pupils equal, round and reactive to light;  extraocular muscles intact Neck: supple Heart: regular rate and rhythm; no murmurs, rubs or gallops Lungs: clear to auscultation bilaterally Abdomen: soft; nondistended; nontender; no masses or hepatosplenomegaly; bowel sounds present Extremities: No deformity; full range of motion; pulses normal Musculoskeletal: There is tenderness over L5-S1 deep tendon reflexes are symmetrical. Straight leg raising was positive with the right leg at 60 Neurologic: Awake, alert and oriented; motor function intact in all extremities and symmetric; no facial droop Skin: Warm and dry Psychiatric: Normal mood and affect          Assessment & Plan:  The patient has known lumbar disc disease. He is status post laser surgery. He has been to see Dr. Murray Hodgkins at the neurosurgical office. He has had 3 previous injections in his back. The last MRI I have on record showed a L5-S1 disc which compress the L5 nerve root with another shallow disc at L4 L5.. referral made back to Dr. Murray Hodgkins he will be on pain medications and a prednisone taper pack. I personally performed the services described in this documentation, which was scribed in my presence. The recorded information has been reviewed and is accurate.

## 2013-02-21 ENCOUNTER — Ambulatory Visit (INDEPENDENT_AMBULATORY_CARE_PROVIDER_SITE_OTHER): Payer: BC Managed Care – PPO | Admitting: Physician Assistant

## 2013-02-21 VITALS — BP 152/96 | HR 115 | Temp 98.9°F | Resp 18 | Ht 72.0 in | Wt 211.0 lb

## 2013-02-21 DIAGNOSIS — M545 Low back pain, unspecified: Secondary | ICD-10-CM

## 2013-02-21 DIAGNOSIS — R292 Abnormal reflex: Secondary | ICD-10-CM

## 2013-02-21 DIAGNOSIS — IMO0002 Reserved for concepts with insufficient information to code with codable children: Secondary | ICD-10-CM

## 2013-02-21 DIAGNOSIS — M5410 Radiculopathy, site unspecified: Secondary | ICD-10-CM

## 2013-02-21 MED ORDER — NAPROXEN 500 MG PO TABS: ORAL_TABLET | ORAL | Status: DC

## 2013-02-21 MED ORDER — GABAPENTIN 100 MG PO CAPS: 100.0000 mg | ORAL_CAPSULE | Freq: Three times a day (TID) | ORAL | Status: DC

## 2013-02-21 MED ORDER — OXYCODONE HCL 5 MG PO CAPS: 5.0000 mg | ORAL_CAPSULE | Freq: Four times a day (QID) | ORAL | Status: DC | PRN

## 2013-02-21 MED ORDER — CYCLOBENZAPRINE HCL 10 MG PO TABS: 10.0000 mg | ORAL_TABLET | Freq: Three times a day (TID) | ORAL | Status: DC | PRN

## 2013-02-21 NOTE — Progress Notes (Signed)
Subjective:    Patient ID: Curtis Yang, male    DOB: [birth year], 52 y.o.   MRN: [id number]  PCP: Nilda Simmer, MD  Chief Complaint  Patient presents with  . Back Pain    x 2 weeks, had surgery in july ; radiates down leg    Medications, allergies, past medical history, surgical history, family history, social history and problem list reviewed and updated.  HPI Presents for re-evaluation of continued low back pain.  This episode began about 2 weeks ago while getting boxes down from a high shelf in the garage.  He was seen 02/12/2013 and prescribed a prednisone taper and hydrocodone, which had been effective for previous episodes.  Unfortunately, he got no benefit from the prednisone and no benefit from the hydrocodone, even taking more than the prescribed dose. Methocarbamol, which he had from a previous episode, is also ineffective.  He describes severe RIGHT sided low back pain that radiates into the RIGHT leg and exacerbated by movement.  It is constant and throbbing. There is no loss of bowel or bladder control, but he has significant difficulty getting to the bathroom, and requires assistance rising from sitting and lowering himself from stating to sitting. No falls, but it feels like his leg may give way on the RIGHT.  He has known disc herniation  L5-S1 with compression of the L5 nerve root and shallow disc at L4-5. He has undergone steroid injections x 3 with Dr. Murray Hodgkins. Surgery 08/2012 in [place] (Dr. Anise Salvo) was to remove scar tissue, not to correct the disc problems.  "This is the worst I've ever been."   Review of Systems As above.    Objective:   Physical Exam  Vitals reviewed. Constitutional: He is oriented to person, place, and time. He appears well-developed and well-nourished. He is cooperative. He appears distressed (he is obviously uncomfortable in the exam room).  HENT:  Head: Normocephalic and atraumatic.  Eyes: Conjunctivae are normal.  Neck: Neck  supple. No thyromegaly present.  Cardiovascular: Regular rhythm and normal heart sounds.   Pulmonary/Chest: Effort normal and breath sounds normal.  Musculoskeletal:       Cervical back: Normal.       Thoracic back: He exhibits no tenderness and no bony tenderness.       Lumbar back: He exhibits decreased range of motion, tenderness, bony tenderness (lumbar and sacral) and spasm (RIGHT lumbar paraspinous muscles). He exhibits no swelling, no edema, no deformity and no laceration.  Lymphadenopathy:    He has no cervical adenopathy.  Neurological: He is alert and oriented to person, place, and time. He displays no atrophy. No cranial nerve deficit or sensory deficit. He exhibits normal muscle tone.  Reflex Scores:      Patellar reflexes are 2+ on the right side and 2+ on the left side.      Achilles reflexes are 0 on the right side and 2+ on the left side. Down-going toes on Babinki's bilaterally, though difficult to elicit bilaterally. SLR is positive on the RIGHT, with pain extending down the posterior thigh, stopping several inches above the knee.  Skin: Skin is warm and dry.  Psychiatric: His speech is normal and behavior is normal. Judgment and thought content normal. His affect is not inappropriate.       Assessment & Plan:  1. Low back pain Known disc herniation at L5-S1 and shallow disc at L4-5.  Now with loss of Achilles reflex on the RIGHT.  Referral to Dr. Murray Hodgkins has been  made, and the patient is in the process of scheduling.   Switch from hydrocodone to oxycodone, methocarbamol to cyclobenzaprine, and add gabapentin.  Continue naproxen. Out of work note until re-evaluation here in one week, or with Dr. Murray Hodgkins.  - oxycodone (OXY-IR) 5 MG capsule; Take 1-2 capsules (5-10 mg total) by mouth every 6 (six) hours as needed.  Dispense: 60 capsule; Refill: 0 - cyclobenzaprine (FLEXERIL) 10 MG tablet; Take 1 tablet (10 mg total) by mouth 3 (three) times daily as needed for muscle  spasms.  Dispense: 30 tablet; Refill: 0 - gabapentin (NEURONTIN) 100 MG capsule; Take 1 capsule (100 mg total) by mouth 3 (three) times daily. Increase by 100 mg every 3 days up to 300 mg TID.  Dispense: 270 capsule; Refill: 0 - naproxen (NAPROSYN) 500 MG tablet; TAKE 1 TABLET BY MOUTH TWICE DAILY WITH FOOD  Dispense: 60 tablet; Refill: 0  2. Radiculopathy of leg See above.   Fernande Bras, PA-C Physician Assistant-Certified [facility]

## 2013-02-23 ENCOUNTER — Other Ambulatory Visit: Payer: Self-pay | Admitting: Family Medicine

## 2013-02-28 ENCOUNTER — Other Ambulatory Visit: Payer: Self-pay | Admitting: Physician Assistant

## 2013-03-02 ENCOUNTER — Telehealth: Payer: Self-pay

## 2013-03-02 DIAGNOSIS — M545 Low back pain, unspecified: Secondary | ICD-10-CM

## 2013-03-02 NOTE — Telephone Encounter (Signed)
Patient has appt with the neurosurgeon on the 15th of January, he said chelle had mentioned  Giving him some pain medication until the appointment please call him back at [phone]

## 2013-03-03 MED ORDER — OXYCODONE HCL 5 MG PO CAPS: 5.0000 mg | ORAL_CAPSULE | Freq: Four times a day (QID) | ORAL | Status: DC | PRN

## 2013-03-03 NOTE — Telephone Encounter (Signed)
Wants to have work note out of work until Jan 19th, please advise Wants renewal on the Oxycodone will run out Tuesday he is using 2 every 6 hrs.

## 2013-03-03 NOTE — Telephone Encounter (Signed)
Curtis Yang gave him Oxycodone.

## 2013-03-03 NOTE — Telephone Encounter (Signed)
Patient advised ready for pick up

## 2013-03-03 NOTE — Telephone Encounter (Signed)
Ready.  Work note and Rx

## 2013-03-05 IMAGING — CR DG LUMBAR SPINE COMPLETE 4+V
6 series · 6 of 6 positions shown · non-contrast
Comparison: None.

CLINICAL DATA: Back pain.

LUMBAR SPINE - COMPLETE 4+ VIEW

[AP (1 of 2)]
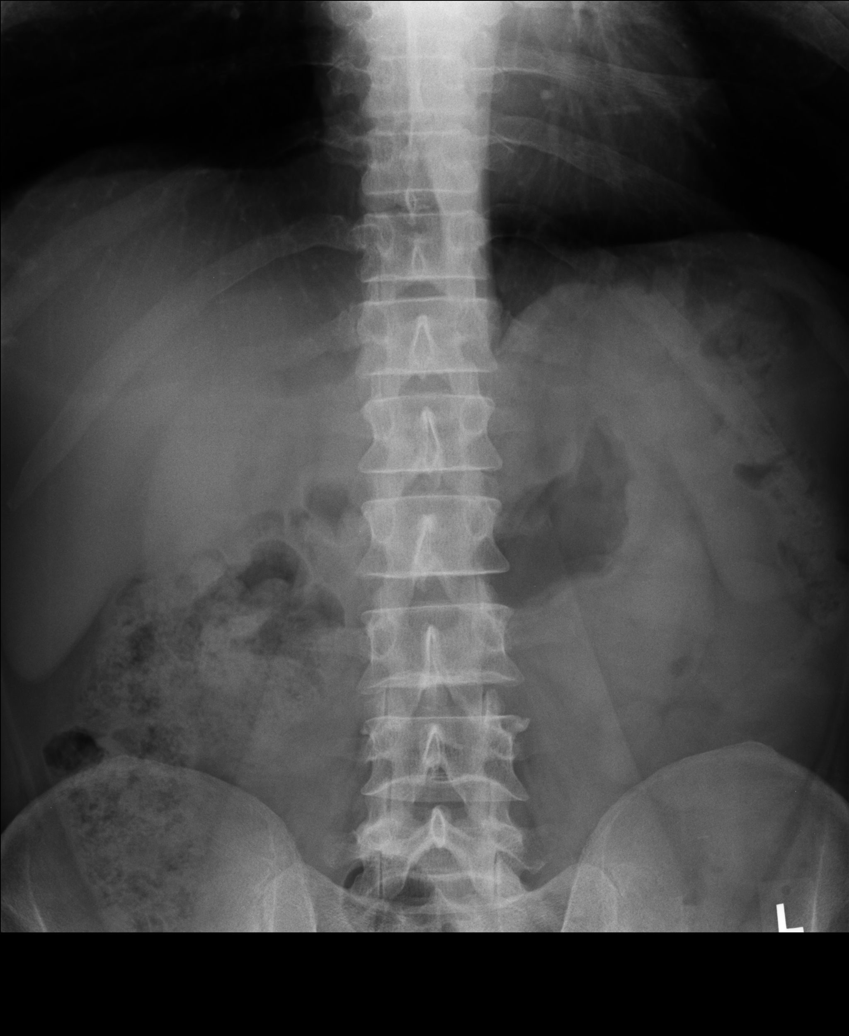

[rpo]
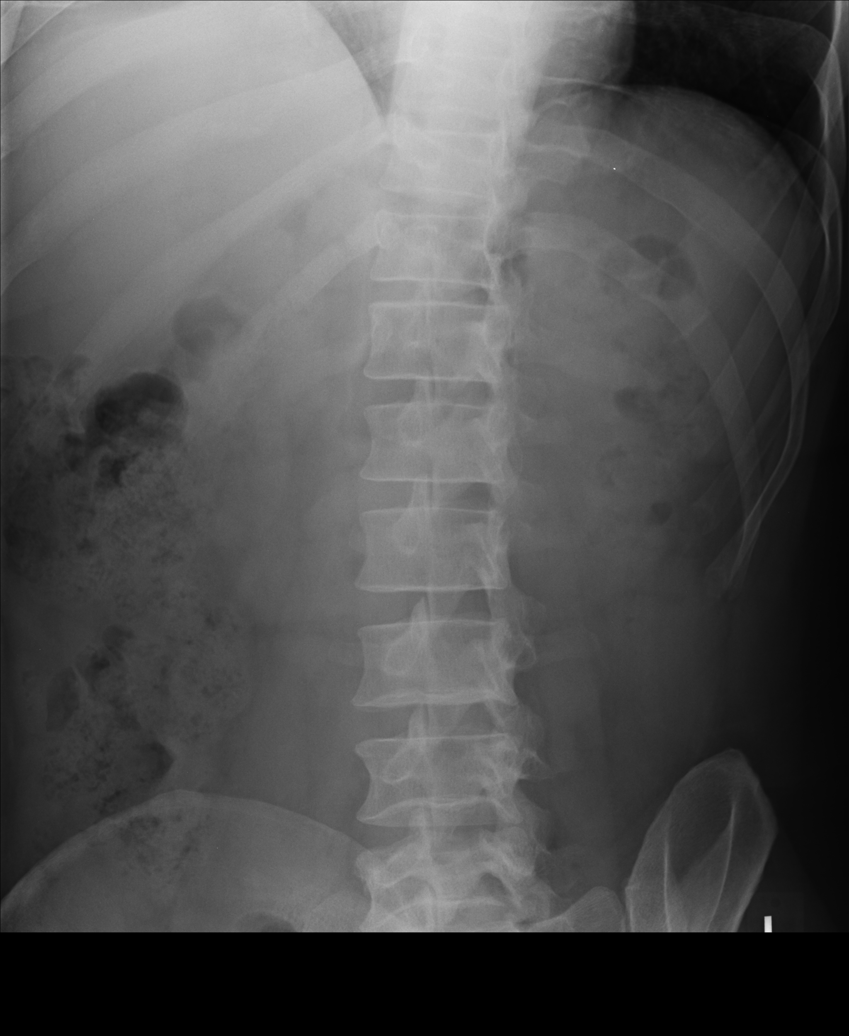

[lpo]
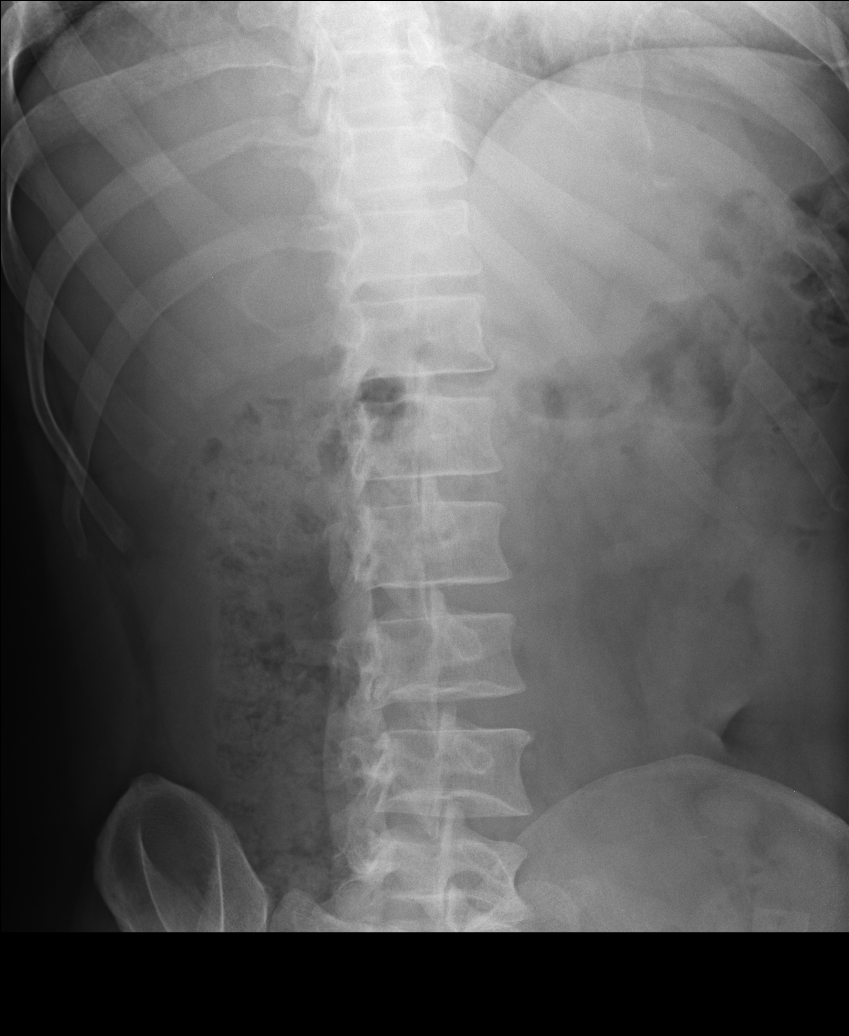

[lateral]
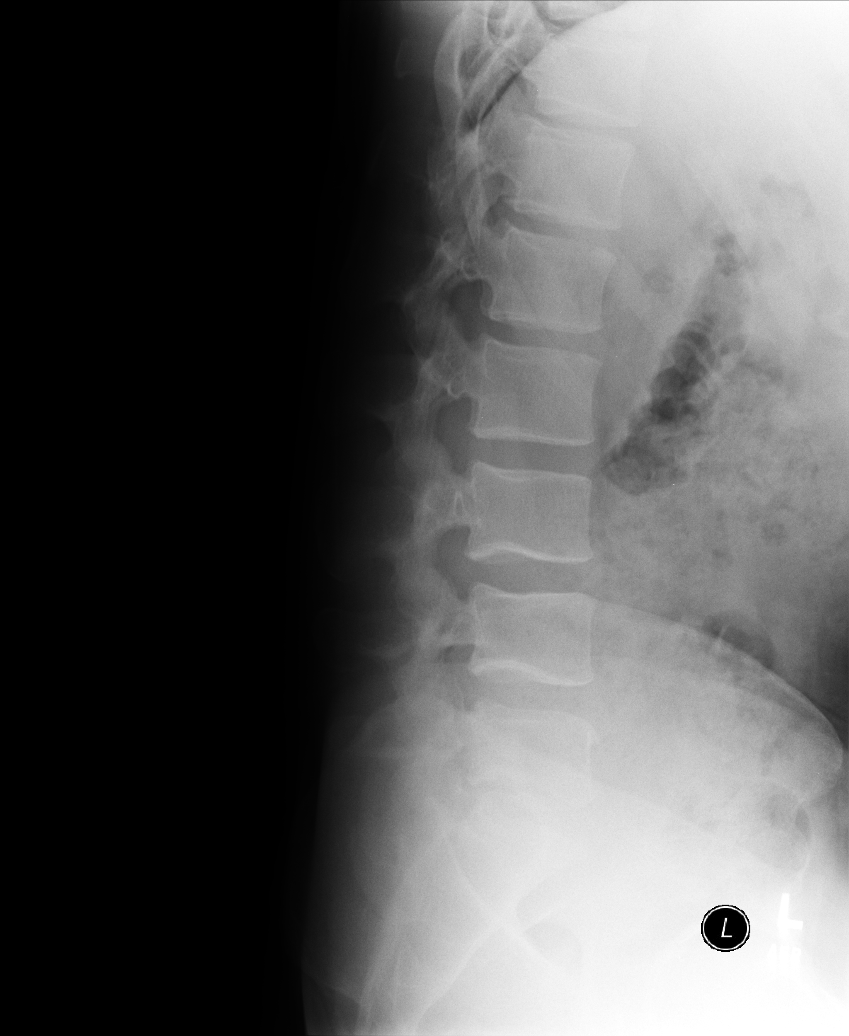

[l5 s1]
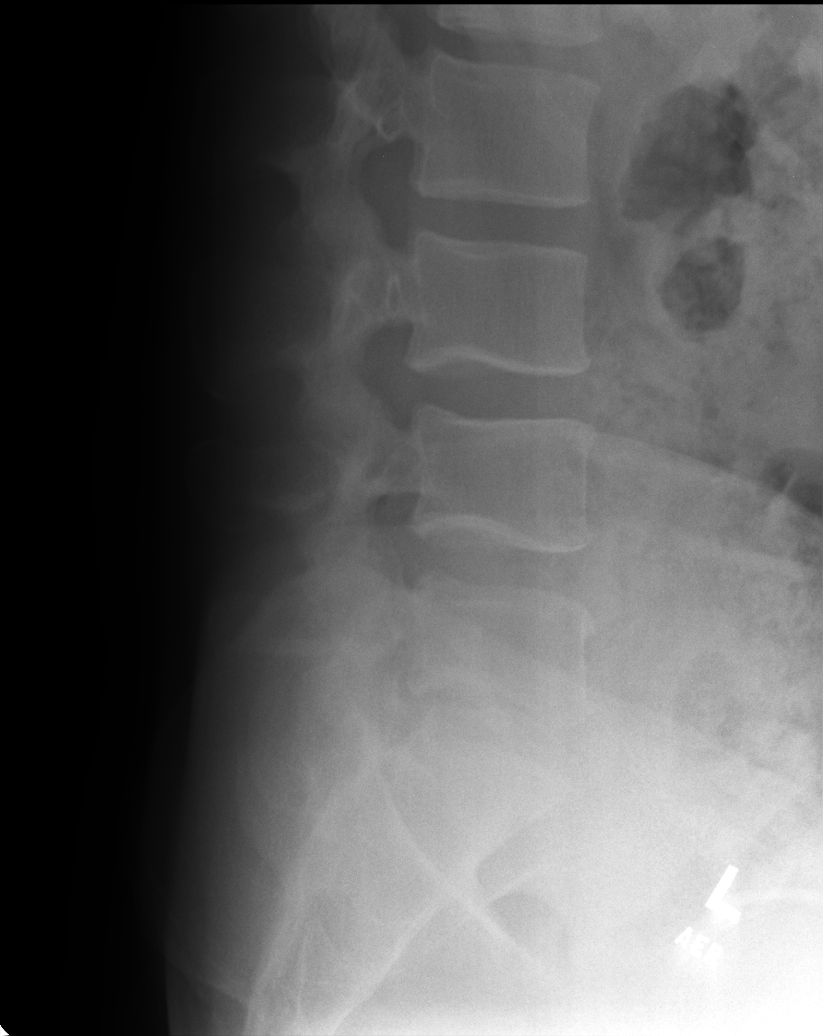

[AP (2 of 2)]
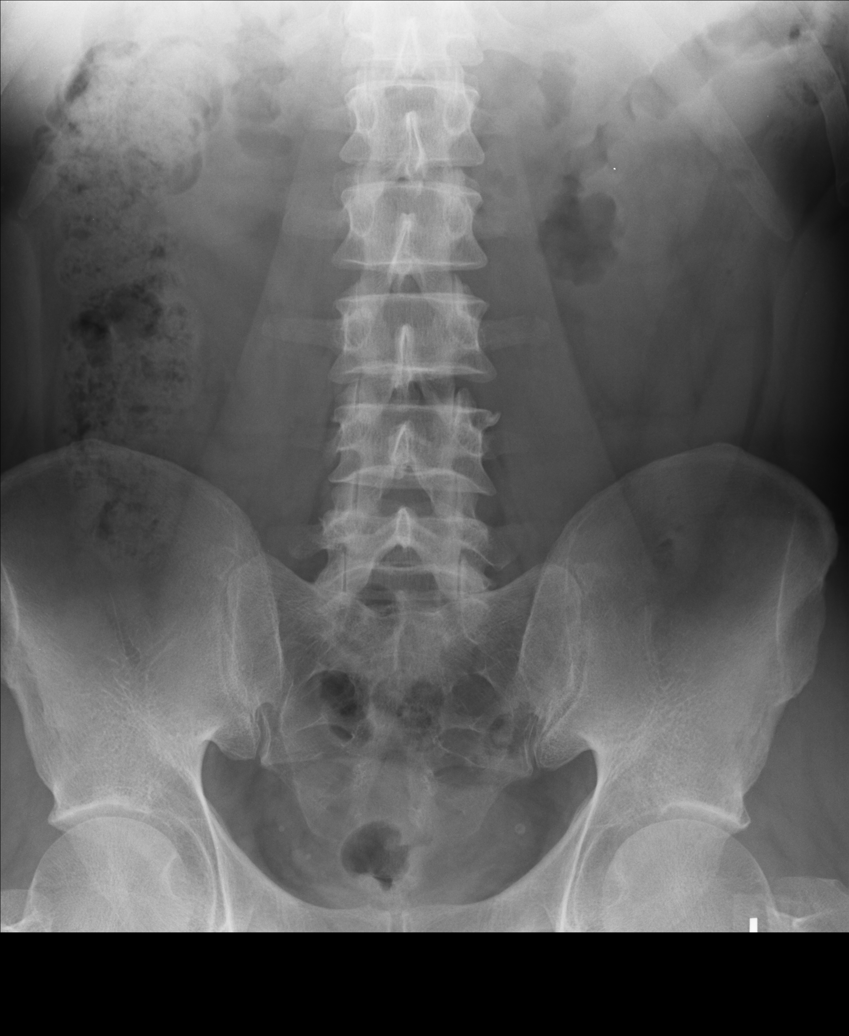

[6 of 6 positions shown; findings below may reference images not displayed]

FINDINGS: There is straightening of the normal lumbar lordosis
without subluxation or fracture.  Minimal endplate degenerative
change along the endplates of L5.  There may be slight loss of disc
space height at L5-S1.  No pars defects.
IMPRESSION: Loss of the normal lumbar lordosis without subluxation or fracture.
Minimal spondylosis in the lower lumbar spine.

## 2013-03-12 ENCOUNTER — Other Ambulatory Visit: Payer: Self-pay | Admitting: Physician Assistant

## 2013-03-13 ENCOUNTER — Other Ambulatory Visit: Payer: Self-pay | Admitting: Physical Medicine and Rehabilitation

## 2013-03-13 DIAGNOSIS — IMO0002 Reserved for concepts with insufficient information to code with codable children: Secondary | ICD-10-CM

## 2013-03-14 ENCOUNTER — Ambulatory Visit
Admission: RE | Admit: 2013-03-14 | Discharge: 2013-03-14 | Disposition: A | Discharge status: Home / Self Care | Payer: BC Managed Care – PPO | Source: Ambulatory Visit | Attending: Physical Medicine and Rehabilitation | Admitting: Physical Medicine and Rehabilitation

## 2013-03-14 DIAGNOSIS — IMO0002 Reserved for concepts with insufficient information to code with codable children: Secondary | ICD-10-CM

## 2013-03-14 MED ORDER — GADOBENATE DIMEGLUMINE 529 MG/ML IV SOLN
18.0000 mL | Freq: Once | INTRAVENOUS | Status: AC | PRN
  Administered 2013-03-14: 18 mL via INTRAVENOUS

## 2013-03-21 ENCOUNTER — Other Ambulatory Visit: Payer: Self-pay | Admitting: Family Medicine

## 2013-03-21 ENCOUNTER — Other Ambulatory Visit: Payer: Self-pay | Admitting: Physician Assistant

## 2013-03-22 NOTE — Telephone Encounter (Signed)
Please call in refill for Lorazepam as approved. 

## 2013-03-25 ENCOUNTER — Other Ambulatory Visit: Payer: Self-pay | Admitting: Family Medicine

## 2013-03-31 ENCOUNTER — Telehealth: Payer: Self-pay | Admitting: Family Medicine

## 2013-03-31 ENCOUNTER — Other Ambulatory Visit: Payer: Self-pay | Admitting: Neurosurgery

## 2013-03-31 NOTE — Telephone Encounter (Signed)
Patient called stating he will be out of medication on 2/15 , would like a refill on  LORazepam (ATIVAN) 1 MG tablet

## 2013-04-01 ENCOUNTER — Other Ambulatory Visit: Payer: Self-pay | Admitting: Family Medicine

## 2013-04-02 NOTE — Telephone Encounter (Signed)
Spoke with pt, advised him that i called his pharmacy to check on this. They never received the original Rx. I called his RX in to the pharmacist. Py understood.

## 2013-04-02 NOTE — Telephone Encounter (Signed)
I approved three refills on 03/21/13; please call pharmacy to clarify that patient has two remaining refills.

## 2013-04-06 ENCOUNTER — Other Ambulatory Visit: Payer: Self-pay | Admitting: Family Medicine

## 2013-04-11 NOTE — Pre-Procedure Instructions (Signed)
MYKING SAR  04/11/2013   Your procedure is scheduled on:  Tuesday, February 24.  Report to [facility], Main Entrance [facility] "A" at 11:10AM.  Call this number if you have problems the morning of surgery: [phone]   Remember:   Do not eat food or drink liquids after midnight Monday, February 23.   Take these medicines the morning of surgery with A SIP OF WATER: -   Do not wear jewelry, make-up or nail polish.  Do not wear lotions, powders, or perfumes.    Men may shave face and neck.  Do not bring valuables to the hospital.  [facility] is not responsible for any belongings or valuables.               Contacts, dentures or bridgework may not be worn into surgery.  Leave suitcase in the car. After surgery it may be brought to your room.  For patients admitted to the hospital, discharge time is determined by your treatment team   Please read over the following fact sheets that you were given: Pain Booklet, Coughing and Deep Breathing, Blood Transfusion Information and Surgical Site Infection Prevention

## 2013-04-14 ENCOUNTER — Encounter (HOSPITAL_COMMUNITY): Payer: Self-pay

## 2013-04-14 ENCOUNTER — Encounter (HOSPITAL_COMMUNITY)
Admission: RE | Admit: 2013-04-14 | Discharge: 2013-04-14 | Disposition: A | Discharge status: Home / Self Care | Payer: BC Managed Care – PPO | Source: Ambulatory Visit | Attending: Neurosurgery | Admitting: Neurosurgery

## 2013-04-14 DIAGNOSIS — Z01812 Encounter for preprocedural laboratory examination: Secondary | ICD-10-CM | POA: Insufficient documentation

## 2013-04-14 HISTORY — DX: Gastro-esophageal reflux disease without esophagitis: K21.9

## 2013-04-14 LAB — CBC
HCT: 43.1 % (ref 39.0–52.0)
Hemoglobin: 15.9 g/dL (ref 13.0–17.0)
MCH: 32.3 pg (ref 26.0–34.0)
MCHC: 36.9 g/dL — ABNORMAL HIGH (ref 30.0–36.0)
MCV: 87.4 fL (ref 78.0–100.0)
PLATELETS: 233 10*3/uL (ref 150–400)
RBC: 4.93 MIL/uL (ref 4.22–5.81)
RDW: 12.8 % (ref 11.5–15.5)
WBC: 6.4 10*3/uL (ref 4.0–10.5)

## 2013-04-14 LAB — BASIC METABOLIC PANEL
BUN: 9 mg/dL (ref 6–23)
CALCIUM: 9.8 mg/dL (ref 8.4–10.5)
CO2: 26 meq/L (ref 19–32)
CREATININE: 1.06 mg/dL (ref 0.50–1.35)
Chloride: 102 mEq/L (ref 96–112)
GFR calc Af Amer: >90 mL/min (ref >90)
GFR calc non Af Amer: 78 mL/min — ABNORMAL LOW (ref >90)
GLUCOSE: 105 mg/dL — AB (ref 70–99)
Potassium: 4.3 mEq/L (ref 3.7–5.3)
Sodium: 141 mEq/L (ref 137–147)

## 2013-04-14 LAB — TYPE AND SCREEN
ABO/RH(D): O POS
ANTIBODY SCREEN: NEGATIVE

## 2013-04-14 LAB — SURGICAL PCR SCREEN
MRSA, PCR: NEGATIVE
Staphylococcus aureus: NEGATIVE

## 2013-04-14 LAB — ABO/RH: ABO/RH(D): O POS

## 2013-04-14 NOTE — Pre-Procedure Instructions (Signed)
[name] CARBONNEAU  04/14/2013   Your procedure is scheduled on:  Tuesday February 24 th at 1312 PM  Report to [facility] Short Stay Main Entrance "A" at 1110 AM.  Call this number if you have problems the morning of surgery: [phone]   Remember:   Do not eat food or drink liquids after midnight Monday.   Take these medicines the morning of surgery with A SIP OF WATER: Ativan  Stop all Nsaids, Aspirin, Fish Oil and Vitamins 7 days prior to surgery.   Do not wear jewelry.  Do not wear lotions, powders, or perfumes. You may wear deodorant.   Men may shave face and neck.  Do not bring valuables to the hospital.  [facility] is not responsible for any belongings or valuables.               Contacts, dentures or bridgework may not be worn into surgery.  Leave suitcase in the car. After surgery it may be brought to your room.  For patients admitted to the hospital, discharge time is determined by your  treatment team.               Patients discharged the day of surgery will not be allowed to drive home.    Special Instructions: [place] - Preparing for Surgery  Before surgery, you can play an important role.  Because skin is not sterile, your skin needs to be as free of germs as possible.  You can reduce the number of germs on you skin by washing with CHG (chlorahexidine gluconate) soap before surgery.  CHG is an antiseptic cleaner which kills germs and bonds with the skin to continue killing germs even after washing.  Please DO NOT use if you have an allergy to CHG or antibacterial soaps.  If your skin becomes reddened/irritated stop using the CHG and inform your nurse when you arrive at Short Stay.  Do not shave (including legs and underarms) for at least 48 hours prior to the first CHG shower.  You may shave your face.  Please follow these instructions carefully:   1.  Shower with CHG Soap the night before surgery and the   morning of Surgery.  2.  If you choose to wash your  hair, wash your hair first as usual with your normal shampoo.  3.  After you shampoo, rinse your hair and body thoroughly to remove the  Shampoo.  4.  Use CHG as you would any other liquid soap.  You can apply chg directly  to the skin and wash gently with scrungie or a clean washcloth.  5.  Apply the CHG Soap to your body ONLY FROM THE NECK DOWN.   Do not use on open wounds or open sores.  Avoid contact with your eyes,       ears, mouth and genitals (private parts).  Wash genitals (private parts)  with your normal soap.  6.  Wash thoroughly, paying special attention to the area where your surgery  will be performed.  7.  Thoroughly rinse your body with warm water from the neck down.  8.  DO NOT shower/wash with your normal soap after using and rinsing off the CHG Soap.  9.  Pat yourself dry with a clean towel.            10.  Wear clean pajamas.            11.  Place clean sheets on your bed the night  of your first shower and do not sleep with pets.  Day of Surgery  Do not apply any lotions/deoderants the morning of surgery.  Please wear clean clothes to the hospital/surgery center.      Please read over the following fact sheets that you were given: Pain Booklet, Coughing and Deep Breathing, Blood Transfusion Information, MRSA Information and Surgical Site Infection Prevention

## 2013-04-21 MED ORDER — CEFAZOLIN SODIUM-DEXTROSE 2-3 GM-% IV SOLR
2.0000 g | INTRAVENOUS | Status: AC
  Administered 2013-04-22: 2 g via INTRAVENOUS
  Filled 2013-04-21: qty 50

## 2013-04-21 MED ORDER — DEXAMETHASONE SODIUM PHOSPHATE 10 MG/ML IJ SOLN
10.0000 mg | INTRAMUSCULAR | Status: AC
  Administered 2013-04-22: 10 mg via INTRAVENOUS
  Filled 2013-04-21: qty 1

## 2013-04-22 ENCOUNTER — Encounter (HOSPITAL_COMMUNITY): Payer: Self-pay | Admitting: Certified Registered"

## 2013-04-22 ENCOUNTER — Inpatient Hospital Stay (HOSPITAL_COMMUNITY)
Admission: RE | Admit: 2013-04-22 | Discharge: 2013-04-25 | DRG: 460 | Disposition: A | Discharge status: Home / Self Care | Payer: BC Managed Care – PPO | Source: Ambulatory Visit | Attending: Neurosurgery | Admitting: Neurosurgery

## 2013-04-22 ENCOUNTER — Encounter (HOSPITAL_COMMUNITY): Payer: BC Managed Care – PPO | Admitting: Certified Registered"

## 2013-04-22 ENCOUNTER — Inpatient Hospital Stay (HOSPITAL_COMMUNITY): Payer: BC Managed Care – PPO | Admitting: Certified Registered"

## 2013-04-22 ENCOUNTER — Inpatient Hospital Stay (HOSPITAL_COMMUNITY): Payer: BC Managed Care – PPO

## 2013-04-22 ENCOUNTER — Encounter (HOSPITAL_COMMUNITY)
Admission: RE | Disposition: A | Discharge status: Home / Self Care | Payer: Self-pay | Source: Ambulatory Visit | Attending: Neurosurgery

## 2013-04-22 DIAGNOSIS — M5126 Other intervertebral disc displacement, lumbar region: Principal | ICD-10-CM | POA: Diagnosis present

## 2013-04-22 DIAGNOSIS — M5116 Intervertebral disc disorders with radiculopathy, lumbar region: Secondary | ICD-10-CM | POA: Diagnosis present

## 2013-04-22 DIAGNOSIS — Z860100 Personal history of colon polyps, unspecified: Secondary | ICD-10-CM

## 2013-04-22 DIAGNOSIS — G4700 Insomnia, unspecified: Secondary | ICD-10-CM | POA: Diagnosis present

## 2013-04-22 DIAGNOSIS — Z8249 Family history of ischemic heart disease and other diseases of the circulatory system: Secondary | ICD-10-CM

## 2013-04-22 DIAGNOSIS — K219 Gastro-esophageal reflux disease without esophagitis: Secondary | ICD-10-CM | POA: Diagnosis present

## 2013-04-22 DIAGNOSIS — E785 Hyperlipidemia, unspecified: Secondary | ICD-10-CM | POA: Diagnosis present

## 2013-04-22 DIAGNOSIS — F411 Generalized anxiety disorder: Secondary | ICD-10-CM | POA: Diagnosis present

## 2013-04-22 DIAGNOSIS — Z818 Family history of other mental and behavioral disorders: Secondary | ICD-10-CM

## 2013-04-22 DIAGNOSIS — Z823 Family history of stroke: Secondary | ICD-10-CM

## 2013-04-22 HISTORY — PX: TRANSFORAMINAL LUMBAR INTERBODY FUSION (TLIF) WITH PEDICLE SCREW FIXATION 1 LEVEL: SHX6141

## 2013-04-22 LAB — GLUCOSE, CAPILLARY: Glucose-Capillary: 103 mg/dL — ABNORMAL HIGH (ref 70–99)

## 2013-04-22 SURGERY — TRANSFORAMINAL LUMBAR INTERBODY FUSION (TLIF) WITH PEDICLE SCREW FIXATION 1 LEVEL
Anesthesia: General | Site: Back | Laterality: Right

## 2013-04-22 MED ORDER — ONDANSETRON HCL 4 MG/2ML IJ SOLN
INTRAMUSCULAR | Status: AC
  Filled 2013-04-22: qty 2

## 2013-04-22 MED ORDER — ROCURONIUM BROMIDE 50 MG/5ML IV SOLN
INTRAVENOUS | Status: AC
  Filled 2013-04-22: qty 1

## 2013-04-22 MED ORDER — OXYCODONE HCL 5 MG/5ML PO SOLN: 5.0000 mg | Freq: Once | ORAL | Status: DC | PRN

## 2013-04-22 MED ORDER — GLYCOPYRROLATE 0.2 MG/ML IJ SOLN
INTRAMUSCULAR | Status: DC | PRN
  Administered 2013-04-22: 0.4 mg via INTRAVENOUS

## 2013-04-22 MED ORDER — HYDROMORPHONE HCL PF 1 MG/ML IJ SOLN
INTRAMUSCULAR | Status: AC
  Administered 2013-04-22: 1 mg via INTRAMUSCULAR
  Filled 2013-04-22: qty 1

## 2013-04-22 MED ORDER — ROCURONIUM BROMIDE 100 MG/10ML IV SOLN
INTRAVENOUS | Status: DC | PRN
  Administered 2013-04-22: 20 mg via INTRAVENOUS
  Administered 2013-04-22: 50 mg via INTRAVENOUS
  Administered 2013-04-22 (×3): 10 mg via INTRAVENOUS
  Administered 2013-04-22: 20 mg via INTRAVENOUS
  Administered 2013-04-22: 10 mg via INTRAVENOUS

## 2013-04-22 MED ORDER — LIDOCAINE HCL (CARDIAC) 20 MG/ML IV SOLN
INTRAVENOUS | Status: DC | PRN
  Administered 2013-04-22: 100 mg via INTRAVENOUS

## 2013-04-22 MED ORDER — ROCURONIUM BROMIDE 50 MG/5ML IV SOLN
INTRAVENOUS | Status: AC
  Filled 2013-04-22: qty 2

## 2013-04-22 MED ORDER — LORAZEPAM 1 MG PO TABS
1.0000 mg | ORAL_TABLET | Freq: Three times a day (TID) | ORAL | Status: DC | PRN
  Administered 2013-04-22 – 2013-04-24 (×4): 1 mg via ORAL
  Filled 2013-04-22 (×3): qty 1

## 2013-04-22 MED ORDER — LACTATED RINGERS IV SOLN
INTRAVENOUS | Status: DC
  Administered 2013-04-22 (×3): via INTRAVENOUS

## 2013-04-22 MED ORDER — MIDAZOLAM HCL 5 MG/5ML IJ SOLN
INTRAMUSCULAR | Status: DC | PRN
  Administered 2013-04-22: 2 mg via INTRAVENOUS

## 2013-04-22 MED ORDER — SODIUM CHLORIDE 0.9 % IR SOLN
Status: DC | PRN
  Administered 2013-04-22: 14:00:00

## 2013-04-22 MED ORDER — NEOSTIGMINE METHYLSULFATE 1 MG/ML IJ SOLN
INTRAMUSCULAR | Status: AC
  Filled 2013-04-22: qty 10

## 2013-04-22 MED ORDER — PHENOL 1.4 % MT LIQD: 1.0000 | OROMUCOSAL | Status: DC | PRN

## 2013-04-22 MED ORDER — PROPOFOL 10 MG/ML IV BOLUS
INTRAVENOUS | Status: DC | PRN
  Administered 2013-04-22: 130 mg via INTRAVENOUS

## 2013-04-22 MED ORDER — GLYCOPYRROLATE 0.2 MG/ML IJ SOLN
INTRAMUSCULAR | Status: AC
  Filled 2013-04-22: qty 2

## 2013-04-22 MED ORDER — SODIUM CHLORIDE 0.9 % IJ SOLN: 3.0000 mL | INTRAMUSCULAR | Status: DC | PRN

## 2013-04-22 MED ORDER — SODIUM CHLORIDE 0.9 % IV SOLN: 250.0000 mL | INTRAVENOUS | Status: DC

## 2013-04-22 MED ORDER — HYDROMORPHONE HCL PF 1 MG/ML IJ SOLN
1.0000 mg | INTRAMUSCULAR | Status: DC | PRN
  Administered 2013-04-22: 1 mg via INTRAMUSCULAR
  Administered 2013-04-23: 1.5 mg via INTRAMUSCULAR
  Administered 2013-04-23: 1 mg via INTRAMUSCULAR
  Administered 2013-04-23: 1.5 mg via INTRAMUSCULAR
  Administered 2013-04-24: 1 mg via INTRAMUSCULAR
  Filled 2013-04-22 (×3): qty 2
  Filled 2013-04-22 (×2): qty 1

## 2013-04-22 MED ORDER — SODIUM CHLORIDE 0.9 % IJ SOLN
3.0000 mL | Freq: Two times a day (BID) | INTRAMUSCULAR | Status: DC
  Administered 2013-04-22 – 2013-04-25 (×5): 3 mL via INTRAVENOUS

## 2013-04-22 MED ORDER — 0.9 % SODIUM CHLORIDE (POUR BTL) OPTIME
TOPICAL | Status: DC | PRN
  Administered 2013-04-22: 1000 mL

## 2013-04-22 MED ORDER — LIDOCAINE HCL (CARDIAC) 20 MG/ML IV SOLN
INTRAVENOUS | Status: AC
  Filled 2013-04-22: qty 5

## 2013-04-22 MED ORDER — CYCLOBENZAPRINE HCL 10 MG PO TABS
ORAL_TABLET | ORAL | Status: AC
  Administered 2013-04-22: 10 mg via ORAL
  Filled 2013-04-22: qty 1

## 2013-04-22 MED ORDER — CEFAZOLIN SODIUM-DEXTROSE 2-3 GM-% IV SOLR
2.0000 g | Freq: Three times a day (TID) | INTRAVENOUS | Status: AC
  Administered 2013-04-22 – 2013-04-23 (×2): 2 g via INTRAVENOUS
  Filled 2013-04-22 (×2): qty 50

## 2013-04-22 MED ORDER — KCL IN DEXTROSE-NACL 20-5-0.45 MEQ/L-%-% IV SOLN
80.0000 mL/h | INTRAVENOUS | Status: DC
  Administered 2013-04-22 – 2013-04-24 (×2): 80 mL/h via INTRAVENOUS
  Filled 2013-04-22 (×6): qty 1000

## 2013-04-22 MED ORDER — SODIUM CHLORIDE 0.9 % IJ SOLN
INTRAMUSCULAR | Status: AC
  Filled 2013-04-22: qty 10

## 2013-04-22 MED ORDER — SUFENTANIL CITRATE 50 MCG/ML IV SOLN
INTRAVENOUS | Status: AC
  Filled 2013-04-22: qty 1

## 2013-04-22 MED ORDER — OXYCODONE HCL 5 MG PO TABS: 5.0000 mg | ORAL_TABLET | Freq: Once | ORAL | Status: DC | PRN

## 2013-04-22 MED ORDER — ACETAMINOPHEN 325 MG PO TABS
650.0000 mg | ORAL_TABLET | ORAL | Status: DC | PRN
  Filled 2013-04-22: qty 2

## 2013-04-22 MED ORDER — ONDANSETRON HCL 4 MG/2ML IJ SOLN
INTRAMUSCULAR | Status: DC | PRN
  Administered 2013-04-22: 4 mg via INTRAVENOUS

## 2013-04-22 MED ORDER — PROPOFOL 10 MG/ML IV BOLUS
INTRAVENOUS | Status: AC
  Filled 2013-04-22: qty 20

## 2013-04-22 MED ORDER — THROMBIN 20000 UNITS EX SOLR
CUTANEOUS | Status: DC | PRN
  Administered 2013-04-22: 14:00:00 via TOPICAL

## 2013-04-22 MED ORDER — MIDAZOLAM HCL 2 MG/2ML IJ SOLN
INTRAMUSCULAR | Status: AC
  Filled 2013-04-22: qty 2

## 2013-04-22 MED ORDER — BUPIVACAINE HCL (PF) 0.5 % IJ SOLN
INTRAMUSCULAR | Status: DC | PRN
  Administered 2013-04-22: 14 mL
  Administered 2013-04-22: 10 mL

## 2013-04-22 MED ORDER — OXYCODONE-ACETAMINOPHEN 5-325 MG PO TABS
1.0000 | ORAL_TABLET | ORAL | Status: DC | PRN
  Administered 2013-04-23: 2 via ORAL
  Administered 2013-04-23: 1 via ORAL
  Administered 2013-04-23 – 2013-04-24 (×2): 2 via ORAL
  Administered 2013-04-24 (×2): 1 via ORAL
  Administered 2013-04-25 (×3): 2 via ORAL
  Filled 2013-04-22 (×2): qty 2
  Filled 2013-04-22: qty 1
  Filled 2013-04-22: qty 2
  Filled 2013-04-22 (×2): qty 1
  Filled 2013-04-22 (×3): qty 2

## 2013-04-22 MED ORDER — HYDROMORPHONE HCL PF 1 MG/ML IJ SOLN: 0.2500 mg | INTRAMUSCULAR | Status: DC | PRN

## 2013-04-22 MED ORDER — ALUM & MAG HYDROXIDE-SIMETH 200-200-20 MG/5ML PO SUSP: 30.0000 mL | Freq: Four times a day (QID) | ORAL | Status: DC | PRN

## 2013-04-22 MED ORDER — CYCLOBENZAPRINE HCL 10 MG PO TABS
10.0000 mg | ORAL_TABLET | Freq: Three times a day (TID) | ORAL | Status: DC | PRN
  Administered 2013-04-22 – 2013-04-25 (×8): 10 mg via ORAL
  Filled 2013-04-22 (×8): qty 1

## 2013-04-22 MED ORDER — TRAZODONE HCL 50 MG PO TABS: 50.0000 mg | ORAL_TABLET | Freq: Every day | ORAL | Status: DC

## 2013-04-22 MED ORDER — OXYCODONE HCL 5 MG PO TABS
ORAL_TABLET | ORAL | Status: AC
  Administered 2013-04-22: 5 mg
  Filled 2013-04-22: qty 1

## 2013-04-22 MED ORDER — ONDANSETRON HCL 4 MG/2ML IJ SOLN: 4.0000 mg | INTRAMUSCULAR | Status: DC | PRN

## 2013-04-22 MED ORDER — PANTOPRAZOLE SODIUM 40 MG IV SOLR
40.0000 mg | Freq: Every day | INTRAVENOUS | Status: DC
  Administered 2013-04-22: 40 mg via INTRAVENOUS
  Filled 2013-04-22 (×2): qty 40

## 2013-04-22 MED ORDER — TRAZODONE HCL 100 MG PO TABS
100.0000 mg | ORAL_TABLET | Freq: Every day | ORAL | Status: DC
  Administered 2013-04-22 – 2013-04-25 (×3): 100 mg via ORAL
  Filled 2013-04-22 (×4): qty 1

## 2013-04-22 MED ORDER — ACETAMINOPHEN 650 MG RE SUPP: 650.0000 mg | RECTAL | Status: DC | PRN

## 2013-04-22 MED ORDER — NEOSTIGMINE METHYLSULFATE 1 MG/ML IJ SOLN
INTRAMUSCULAR | Status: DC | PRN
  Administered 2013-04-22: 3 mg via INTRAVENOUS

## 2013-04-22 MED ORDER — SUFENTANIL CITRATE 50 MCG/ML IV SOLN
INTRAVENOUS | Status: DC | PRN
  Administered 2013-04-22 (×5): 10 ug via INTRAVENOUS
  Administered 2013-04-22: 20 ug via INTRAVENOUS

## 2013-04-22 MED ORDER — HYDROMORPHONE HCL PF 1 MG/ML IJ SOLN
INTRAMUSCULAR | Status: AC
  Administered 2013-04-22: 0.5 mg
  Filled 2013-04-22: qty 1

## 2013-04-22 MED ORDER — HYDROMORPHONE HCL PF 1 MG/ML IJ SOLN
0.2500 mg | INTRAMUSCULAR | Status: DC | PRN
  Administered 2013-04-22 (×3): 0.5 mg via INTRAVENOUS

## 2013-04-22 MED ORDER — MENTHOL 3 MG MT LOZG: 1.0000 | LOZENGE | OROMUCOSAL | Status: DC | PRN

## 2013-04-22 MED ORDER — PROMETHAZINE HCL 25 MG/ML IJ SOLN: 6.2500 mg | INTRAMUSCULAR | Status: DC | PRN

## 2013-04-22 SURGICAL SUPPLY — 74 items
BAG DECANTER FOR FLEXI CONT (MISCELLANEOUS) ×2 IMPLANT
BENZOIN TINCTURE PRP APPL 2/3 (GAUZE/BANDAGES/DRESSINGS) ×2 IMPLANT
BLADE SURG ROTATE 9660 (MISCELLANEOUS) ×2 IMPLANT
BONE EQUIVA 5CC (Bone Implant) ×2 IMPLANT
BRUSH SCRUB EZ PLAIN DRY (MISCELLANEOUS) ×2 IMPLANT
CANISTER SUCT 3000ML (MISCELLANEOUS) ×2 IMPLANT
CONT SPEC 4OZ CLIKSEAL STRL BL (MISCELLANEOUS) ×4 IMPLANT
COVER BACK TABLE 24X17X13 BIG (DRAPES) IMPLANT
COVER TABLE BACK 60X90 (DRAPES) ×2 IMPLANT
DERMABOND ADVANCED (GAUZE/BANDAGES/DRESSINGS) ×1
DERMABOND ADVANCED .7 DNX12 (GAUZE/BANDAGES/DRESSINGS) ×1 IMPLANT
DILATOR NON-RADIOLUCENT CANN (MISCELLANEOUS) ×4 IMPLANT
DRAPE C-ARM 42X72 X-RAY (DRAPES) ×4 IMPLANT
DRAPE C-ARMOR (DRAPES) ×2 IMPLANT
DRAPE LAPAROTOMY 100X72X124 (DRAPES) ×2 IMPLANT
DRAPE POUCH INSTRU U-SHP 10X18 (DRAPES) ×2 IMPLANT
DRAPE SURG 17X23 STRL (DRAPES) ×4 IMPLANT
DRESSING TELFA 8X3 (GAUZE/BANDAGES/DRESSINGS) IMPLANT
DRSG OPSITE POSTOP 4X6 (GAUZE/BANDAGES/DRESSINGS) ×2 IMPLANT
ELECT BLADE 4.0 EZ CLEAN MEGAD (MISCELLANEOUS) ×4
ELECT REM PT RETURN 9FT ADLT (ELECTROSURGICAL) ×2
ELECTRODE BLDE 4.0 EZ CLN MEGD (MISCELLANEOUS) ×2 IMPLANT
ELECTRODE REM PT RTRN 9FT ADLT (ELECTROSURGICAL) ×1 IMPLANT
EVACUATOR 1/8 PVC DRAIN (DRAIN) IMPLANT
GAUZE SPONGE 4X4 16PLY XRAY LF (GAUZE/BANDAGES/DRESSINGS) ×2 IMPLANT
GLOVE BIO SURGEON STRL SZ8 (GLOVE) ×2 IMPLANT
GLOVE ECLIPSE 8.0 STRL XLNG CF (GLOVE) ×4 IMPLANT
GLOVE ECLIPSE 8.5 STRL (GLOVE) ×2 IMPLANT
GLOVE INDICATOR 7.5 STRL GRN (GLOVE) ×6 IMPLANT
GLOVE INDICATOR 8.5 STRL (GLOVE) ×2 IMPLANT
GLOVE SURG SS PI 7.0 STRL IVOR (GLOVE) ×8 IMPLANT
GOWN BRE IMP SLV AUR LG STRL (GOWN DISPOSABLE) IMPLANT
GOWN BRE IMP SLV AUR XL STRL (GOWN DISPOSABLE) IMPLANT
GOWN STRL REIN 2XL LVL4 (GOWN DISPOSABLE) IMPLANT
GOWN STRL REUS W/ TWL LRG LVL3 (GOWN DISPOSABLE) ×3 IMPLANT
GOWN STRL REUS W/ TWL XL LVL3 (GOWN DISPOSABLE) ×1 IMPLANT
GOWN STRL REUS W/TWL 2XL LVL3 (GOWN DISPOSABLE) ×2 IMPLANT
GOWN STRL REUS W/TWL LRG LVL3 (GOWN DISPOSABLE) ×3
GOWN STRL REUS W/TWL XL LVL3 (GOWN DISPOSABLE) ×1
K-WIRE NITHNOL TROCAR TIP (WIRE) ×4 IMPLANT
KIT BASIN OR (CUSTOM PROCEDURE TRAY) ×2 IMPLANT
KIT ROOM TURNOVER OR (KITS) ×2 IMPLANT
NEEDLE HYPO 22GX1.5 SAFETY (NEEDLE) ×2 IMPLANT
NEEDLE TARGETING (NEEDLE) ×4 IMPLANT
NS IRRIG 1000ML POUR BTL (IV SOLUTION) ×2 IMPLANT
PACK LAMINECTOMY NEURO (CUSTOM PROCEDURE TRAY) ×2 IMPLANT
PAD ARMBOARD 7.5X6 YLW CONV (MISCELLANEOUS) ×6 IMPLANT
PATTIES SURGICAL .75X.75 (GAUZE/BANDAGES/DRESSINGS) ×2 IMPLANT
PEDICLE ACCESS TOOL SHEATH ×2 IMPLANT
PEEK CAGE 11X11X34 (Cage) ×2 IMPLANT
ROD PATHFINDER 40MM (Rod) ×2 IMPLANT
ROD PERC 30MM (Rod) ×2 IMPLANT
SCREW MIN INVASIVE 6.5X35 (Screw) ×4 IMPLANT
SCREW POLYAXIA MIS 6.5X40MM (Screw) ×4 IMPLANT
SHEATH PAT (SHEATH) ×2 IMPLANT
SPONGE GAUZE 4X4 12PLY (GAUZE/BANDAGES/DRESSINGS) ×2 IMPLANT
SPONGE LAP 4X18 X RAY DECT (DISPOSABLE) IMPLANT
SPONGE SURGIFOAM ABS GEL 100 (HEMOSTASIS) ×2 IMPLANT
STAPLER SKIN PROX WIDE 3.9 (STAPLE) ×2 IMPLANT
STRIP CLOSURE SKIN 1/2X4 (GAUZE/BANDAGES/DRESSINGS) ×2 IMPLANT
SUT VIC AB 0 CT1 18XCR BRD8 (SUTURE) ×1 IMPLANT
SUT VIC AB 0 CT1 8-18 (SUTURE) ×1
SUT VIC AB 2-0 OS6 18 (SUTURE) ×10 IMPLANT
SUT VIC AB 3-0 CP2 18 (SUTURE) ×2 IMPLANT
SYR 20ML ECCENTRIC (SYRINGE) ×2 IMPLANT
TOOL FLUTED BALL 7MM (MISCELLANEOUS) ×2 IMPLANT
TOOL MATCHSTK 3MM (MISCELLANEOUS) ×2 IMPLANT
TOP CLSR SEQUOIA (Orthopedic Implant) ×8 IMPLANT
TOWEL OR 17X24 6PK STRL BLUE (TOWEL DISPOSABLE) ×2 IMPLANT
TOWEL OR 17X26 10 PK STRL BLUE (TOWEL DISPOSABLE) ×2 IMPLANT
TRAP SPECIMEN MUCOUS 40CC (MISCELLANEOUS) ×2 IMPLANT
TRAY FOLEY CATH 14FRSI W/METER (CATHETERS) IMPLANT
TRAY FOLEY CATH 16FRSI W/METER (SET/KITS/TRAYS/PACK) ×2 IMPLANT
WATER STERILE IRR 1000ML POUR (IV SOLUTION) ×2 IMPLANT

## 2013-04-22 NOTE — Preoperative (Signed)
Beta Blockers   Reason not to administer Beta Blockers:Not Applicable 

## 2013-04-22 NOTE — Op Note (Signed)
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 Using AP fluoroscopy which is an entry point in with the pedicles in line with the disc at L5-S1. We infiltrated with local anesthetic and then made a small skin incision. We did sequential dilation through the muscles and then placed a minimally invasive retractor which was then secured to the table in standard fashion. We dissected down through the muscle to identify the facet joint and placed a medial retractor was then opened help with exposure. We then placed a lateral retractor for retraction in all directions. We then took 10-15 minutes and cleaned muscle off of the lamina and facet joint at L5-S1. S1 far lateral to identify the transverse process of L5. We then used a high-speed drill to perform a generous laminotomy at L5-S1. We removed most of the facet joint. 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 This made access to the disc somewhat difficult but achievable. The large amounts of herniated disc material beneath both nerve roots this was thoroughly cleaned out. In addition, we entered the disc space both medial and lateral to the S1 nerve root thoroughly cleaned out as well. We then prepared the disc for interbody fusion. We divided decorticate instruments after distracting the disc space up to 11 mm size. We then placed a mixture of autologous bone morselized allograft it  within the interspace to help with the interbody fusion. We then chose an 11 x 11 x 44 mm peek interbody spacer filled with a mixture of autologous bone morselized allograft. Taking great care to protect the L5 and S1 nerve roots were impacted the spacer into the disc space then kicked it into excellent lateral position. Fossae showed to be in excellent position. We then placed pedicle screws the open fashion on the patient's right side. We drill hole entry points then passed the pedicle all. Tapped with a 5.5 mm tap and placed a 6.5 x 40 mm screw at L5-1 6.5 x 35 Miller screw at S1. These were followed in excellent position AP lateral fluoroscopy. We chose appropriate length rod and secured to the top of the screws. We then did tightening and final tightening with torque and counter torque. We then placed percutaneous pedicle screws on the patient's left side after closing the right-sided incision up to the subcuticular layer. We passed the Jamshidi needle through the pedicle and lateral to medial direction and then connected the 2 small incisions. We did sequential dilation tapped with a 5.5 mm tap and placed the same size screws on the patient's left side there were on the right guidewire. We removed the wire. We placed appropriate length rod down to the [place] and secured at the top of the screws. We did final tightening with torque and counter torque. Final fluoroscopy in AP lateral direction looked excellent. Both wounds were irrigated copiously prior to closing. Prior to closing the patient's right side and a posterior lateral fusion decorticated the far lateral region of the sacrum and the transverse process of L5. The  suture autologous bone morselized allograft on that area. We closed the wound in multiple layers of Vicryl on the fascia subcutaneous subcuticular tissues. We placed Dermabond and Steri-Strips on the skin. Shortness was then applied and the patient was extubated and taken to recovery room in  stable condition.

## 2013-04-22 NOTE — H&P (Signed)
Curtis Yang is an 53 y.o. male.   Chief Complaint: Back pain into the right leg HPI: The patient is a 52 year old gentleman who was originally seen about a year ago foraminal disc abnormality at L5-S1 on the right. He was referred to Dr. Mallie Mussel for injections which gave him no improvement. [facility] some sort of procedure on the right side of his back was performed. Skin some temporary relief in the late fall he developed severe recurrent right leg pain returned back to Dr. Brien Few. He another injection which gave him no relief and underwent imaging studies. He was then seen for a neurosurgical opinion. After evaluation the office the patient's films were reviewed. The showed that the disc abnormality on the right side at L5-S1 and increased in size and significance and now completely 1 from the intraforaminal to the extraforaminal compartment compressing both the L5 and S1 nerve roots. It was felt that he could no longer have a simple discectomy needed to have sacrificed the joint so the entire disc to be cleaned out of both nerve roots decompressed. Was therefore elected to do a right L5-S1 T. lift with pedicle screw fixation. I had a long discussion with him regarding the risks and benefits of surgical intervention. The risks discussed include but are not limited to bleeding infection weakness numbness paralysis trouble with instrumentation nonunion coma and death. We have discussed the possibility spinal fluid leakage as well. We discussed the risks and benefits of nonintervention. He's had the opportunity to ask numerous questions and appears to understand. With this information in hand he has requested we proceed with surgery and is admitted this time his procedure.  Past Medical History  Diagnosis Date  . Hypogonadism male   . Hyperlipidemia   . Depression   . Insomnia   . Benign skin lesion of nose   . Increased serum lipids   . Low back pain   .  Erectile dysfunction     Due to decreased testosterone   . Increased glucose level   . Low testosterone   . History of colon polyps   . Anxiety   . Arthritis     DDD lumbar.  . Previous back surgery   . GERD (gastroesophageal reflux disease)     Past Surgical History  Procedure Laterality Date  . Eye surgery  02/28/2000    Lasik  . Colonoscopy w/ polypectomy  05/03/2007    five polyps; repeat in 5 years; [name].  [name] Spine surgery  09/26/2012    [facility] [place].    Family History  Problem Relation Age of Onset  . Heart disease Maternal Grandmother   . Hypertension Maternal Grandmother   . Stroke Maternal Grandfather   . Anxiety disorder Mother   . Cancer Paternal Grandfather     prostate  . Anxiety disorder Brother   . Stroke Paternal Grandmother    Social History:  reports that he has never smoked. He has never used smokeless tobacco. He reports that he does not drink alcohol or use illicit drugs.  Allergies: No Known Allergies  Medications Prior to Admission  Medication Dose Route Frequency Provider Last Rate Last Dose  . testosterone cypionate (DEPOTESTOTERONE CYPIONATE) injection 400 mg  400 mg Intramuscular N05 Days Beatriz Chancellor, PA-C   400 mg at 10/01/11 3976   Medications Prior to Admission  Medication Sig Dispense Refill  . LORazepam (ATIVAN) 1 MG tablet Take  1 mg by mouth every 8 (eight) hours as needed for anxiety.       . pravastatin (PRAVACHOL) 20 MG tablet Take 2 tablets (40 mg total) by mouth daily. At bedtime  180 tablet  3  . testosterone cypionate (DEPOTESTOTERONE CYPIONATE) 200 MG/ML injection Inject 1.5 mLs (300 mg total) into the muscle every 14 (fourteen) days.  10 mL  0  . traZODone (DESYREL) 50 MG tablet Take 50-100 mg by mouth at bedtime.      . triamcinolone cream (KENALOG) 0.1 % Apply topically 2 (two) times daily.  45 g  1  . VANISHPOINT SAFETY SYRINGE 22G X 1-1/2" 3 ML MISC USE AS DIRECTED TO SELF-ADMINISTER  TESTOSTERONE  50 each  0    Results for orders placed during the hospital encounter of 04/22/13 (from the past 48 hour(s))  GLUCOSE, CAPILLARY     Status: Abnormal   Collection Time    04/22/13 10:49 AM      Result Value Ref Range   Glucose-Capillary 103 (*) 70 - 99 mg/dL   No results found.  Review of systems not obtained due to patient factors.  Blood pressure 163/101, pulse 109, temperature 97.9 F (36.6 C), temperature source Oral, resp. rate 20, SpO2 98.00%.  The patient is awake alert and oriented. He has no facial asymmetry. His gait is mildly antalgic. He has a decreased sensation on the dorsum of the right foot and some mild weakness of extensor pollicis longus and dorsi flexion Assessment/Plan Impression is that of a disc herniation (extraforaminal L5-S1. The plan is for a right L5-S1 T. lift with instrumentation.  Faythe Ghee, MD 04/22/2013, 1:03 PM

## 2013-04-22 NOTE — Anesthesia Preprocedure Evaluation (Signed)
Anesthesia Evaluation  Patient identified by MRN, date of birth, ID band Patient awake    Reviewed: Allergy & Precautions, H&P , NPO status , Patient's Chart, lab work & pertinent test results  Airway Mallampati: I TM Distance: >3 FB Neck ROM: Full    Dental  (+) Teeth Intact, Dental Advisory Given   Pulmonary          Cardiovascular     Neuro/Psych    GI/Hepatic   Endo/Other    Renal/GU      Musculoskeletal   Abdominal   Peds  Hematology   Anesthesia Other Findings   Reproductive/Obstetrics                           Anesthesia Physical Anesthesia Plan  ASA: II  Anesthesia Plan: General   Post-op Pain Management:    Induction: Intravenous  Airway Management Planned: Oral ETT  Additional Equipment:   Intra-op Plan:   Post-operative Plan: Extubation in OR  Informed Consent: I have reviewed the patients History and Physical, chart, labs and discussed the procedure including the risks, benefits and alternatives for the proposed anesthesia with the patient or authorized representative who has indicated his/her understanding and acceptance.   Dental advisory given  Plan Discussed with: CRNA, Anesthesiologist and Surgeon  Anesthesia Plan Comments:         Anesthesia Quick Evaluation

## 2013-04-22 NOTE — Transfer of Care (Signed)
Immediate Anesthesia Transfer of Care Note  Patient: Curtis Yang  Procedure(s) Performed: Procedure(s): TRANSFORAMINAL LUMBAR INTERBODY FUSION (TLIF) WITH PEDICLE SCREW FIXATION LUMBAR FIVE SACRAL ONE (Right)  Patient Location: PACU  Anesthesia Type:General  Level of Consciousness: awake, alert  and oriented  Airway & Oxygen Therapy: Patient Spontanous Breathing and Patient connected to nasal cannula oxygen  Post-op Assessment: Report given to PACU RN, Post -op Vital signs reviewed and stable and Patient moving all extremities  Post vital signs: Reviewed and stable  Complications: No apparent anesthesia complications

## 2013-04-22 NOTE — Anesthesia Procedure Notes (Signed)
Procedure Name: Intubation Date/Time: 04/22/2013 1:33 PM Performed by: Melina Copa, Gilbert Narain R Pre-anesthesia Checklist: Patient identified, Emergency Drugs available, Suction available, Patient being monitored and Timeout performed Patient Re-evaluated:Patient Re-evaluated prior to inductionOxygen Delivery Method: Circle system utilized Preoxygenation: Pre-oxygenation with 100% oxygen Intubation Type: IV induction Ventilation: Mask ventilation without difficulty Laryngoscope Size: Mac and 4 Grade View: Grade I Tube type: Oral Tube size: 8.0 mm Number of attempts: 1 Airway Equipment and Method: Stylet Placement Confirmation: ETT inserted through vocal cords under direct vision,  positive ETCO2 and breath sounds checked- equal and bilateral Secured at: 22 cm Tube secured with: Tape Dental Injury: Teeth and Oropharynx as per pre-operative assessment

## 2013-04-22 NOTE — Anesthesia Postprocedure Evaluation (Signed)
  Anesthesia Post-op Note  Patient: Curtis Yang  Procedure(s) Performed: Procedure(s): TRANSFORAMINAL LUMBAR INTERBODY FUSION (TLIF) WITH PEDICLE SCREW FIXATION LUMBAR FIVE SACRAL ONE (Right)  Patient Location: PACU  Anesthesia Type:General  Level of Consciousness: awake and alert   Airway and Oxygen Therapy: Patient Spontanous Breathing  Post-op Pain: mild  Post-op Assessment: Post-op Vital signs reviewed, Patient's Cardiovascular Status Stable, Respiratory Function Stable, Patent Airway, No signs of Nausea or vomiting and Pain level controlled  Post-op Vital Signs: Reviewed and stable  Complications: No apparent anesthesia complications

## 2013-04-23 MED ORDER — PANTOPRAZOLE SODIUM 40 MG PO TBEC
40.0000 mg | DELAYED_RELEASE_TABLET | Freq: Every day | ORAL | Status: DC
  Administered 2013-04-23 – 2013-04-24 (×2): 40 mg via ORAL
  Filled 2013-04-23 (×2): qty 1

## 2013-04-23 NOTE — Progress Notes (Signed)
UR complete.  Kayshawn Ozburn RN, MSN 

## 2013-04-23 NOTE — Progress Notes (Signed)
Nutrition Brief Note  Patient identified on the Malnutrition Screening Tool (MST) Report  Wt Readings from Last 5 Encounters:  04/22/13 200 lb 13.4 oz (91.1 kg)  04/22/13 200 lb 13.4 oz (91.1 kg)  04/14/13 202 lb 6.4 oz (91.808 kg)  02/21/13 211 lb (95.709 kg)  02/12/13 206 lb 6.4 oz (93.622 kg)    Body mass index is 27.23 kg/(m^2). Patient meets criteria for overweight based on current BMI.   Current diet order is regular, patient is consuming approximately 50-75% of meals at this time. Labs and medications reviewed. Admitted with disc herniation (extraforaminal L5-S1) which pt had surgical repair for yesterday. Met with pt and wife who report pt was eating well prior to surgery, 2 meals/day at home with good appetite. Deny any changes in weight. C/o slight bloating but other than that eating well without any nutritional concerns.   No nutrition interventions warranted at this time. If nutrition issues arise, please consult RD.   Mikey College MS, [place], [place] Pager [phone] After Hours Pager

## 2013-04-23 NOTE — Progress Notes (Signed)
Patient ID: Curtis Yang, male   DOB: 08/11/1960, 53 y.o.   MRN: [id number] Afeb, vss Marked incisional pain. Wound fine. No leg pain. Neuro intact. Needs to increase activity.

## 2013-04-24 MED ORDER — DOCUSATE SODIUM 100 MG PO CAPS
100.0000 mg | ORAL_CAPSULE | Freq: Two times a day (BID) | ORAL | Status: DC
  Administered 2013-04-24 – 2013-04-25 (×2): 100 mg via ORAL
  Filled 2013-04-24 (×2): qty 1

## 2013-04-24 MED ORDER — FLEET ENEMA 7-19 GM/118ML RE ENEM: 1.0000 | ENEMA | Freq: Every day | RECTAL | Status: DC | PRN

## 2013-04-24 MED ORDER — BISACODYL 10 MG RE SUPP: 10.0000 mg | Freq: Every day | RECTAL | Status: DC | PRN

## 2013-04-24 NOTE — Progress Notes (Signed)
Patient ID: Curtis Yang, male   DOB: 19-Oct-1960, 52 y.o.   MRN: [id number] Subjective:  The patient is alert and pleasant. He looks well. He is in no apparent distress. He complains of constipation.  Objective: Vital signs in last 24 hours: Temp:  [97.6 F (36.4 C)-99.4 F (37.4 C)] 98.5 F (36.9 C) (02/26 0808) Pulse Rate:  [108-118] 117 (02/26 0808) Resp:  [16-20] 18 (02/26 0808) BP: (111-132)/(69-81) 132/81 mmHg (02/26 0808) SpO2:  [93 %-99 %] 94 % (02/26 0808)  Intake/Output from previous day: 02/25 0701 - 02/26 0700 In: 583 [P.O.:480; I.V.:3; IV Piggyback:100] Out: -  Intake/Output this shift: Total I/O In: 120 [P.O.:120] Out: -   Physical exam patient is alert and oriented. He is moving his lower extremities well. His abdomen is soft.  Lab Results: No results found for this basename: WBC, HGB, HCT, PLT,  in the last 72 hours BMET No results found for this basename: NA, K, CL, CO2, GLUCOSE, BUN, CREATININE, CALCIUM,  in the last 72 hours  Studies/Results: Dg Lumbar Spine 2-3 Views  04/22/2013   CLINICAL DATA:  L5-S1 fixation.  EXAM: LUMBAR SPINE - 2-3 VIEW; DG C-ARM GT 120 MIN  COMPARISON:  MR L SPINE WO/W CM dated 03/14/2013  FINDINGS: AP and lateral intraoperative views. These demonstrate posterior fixation at L5-S1. No acute hardware complication. Interbody fusion material is appropriately positioned.  IMPRESSION: Intraoperative imaging of L5-S1 fixation.   Electronically Signed   By: Abigail Miyamoto M.D.   On: 04/22/2013 19:59   Dg C-arm Gt 120 Min  04/22/2013   CLINICAL DATA:  L5-S1 fixation.  EXAM: LUMBAR SPINE - 2-3 VIEW; DG C-ARM GT 120 MIN  COMPARISON:  MR L SPINE WO/W CM dated 03/14/2013  FINDINGS: AP and lateral intraoperative views. These demonstrate posterior fixation at L5-S1. No acute hardware complication. Interbody fusion material is appropriately positioned.  IMPRESSION: Intraoperative imaging of L5-S1 fixation.   Electronically Signed   By: Abigail Miyamoto M.D.    On: 04/22/2013 19:59    Assessment/Plan: Postop day #2: I will add Colace and when necessary enema and suppository. He will likely come tomorrow.  LOS: 2 days     Doriana Mazurkiewicz D 04/24/2013, 5:33 PM

## 2013-04-25 MED ORDER — CYCLOBENZAPRINE HCL 10 MG PO TABS: 10.0000 mg | ORAL_TABLET | Freq: Three times a day (TID) | ORAL | Status: DC | PRN

## 2013-04-25 MED ORDER — OXYCODONE-ACETAMINOPHEN 5-325 MG PO TABS: 1.0000 | ORAL_TABLET | ORAL | Status: DC | PRN

## 2013-04-25 NOTE — Discharge Summary (Signed)
  Physician Discharge Summary  Patient ID: HESTER FORGET MRN: [id number] DOB/AGE: 1960/05/31 53 y.o.  Admit date: 04/22/2013 Discharge date: 04/25/2013  Admission Diagnoses:  Discharge Diagnoses:  Active Problems:   Lumbar disc herniation with radiculopathy   Discharged Condition: good  Hospital Course: Surgery 3 days ago for hnp L5 S1 thru the foramen. Had minimally invasive tlif. Did well. Slowly increased activity. Wounds looking fine. By pod 3, up ambulating well. Pain well controlled. Home with specific instructions.  Consults: None  Significant Diagnostic Studies: none  Treatments: surgery: Right L5 S1 tlif  Discharge Exam: Blood pressure 126/81, pulse 110, temperature 98.2 F (36.8 C), temperature source Oral, resp. rate 20, height 6' (1.829 m), weight 91.1 kg (200 lb 13.4 oz), SpO2 97.00%. Incision/Wound:clean and dry; no new neuro issues  Disposition: Final discharge disposition not confirmed   Future Appointments Provider Department Dept Phone   08/05/2013 8:30 AM Wardell Honour, MD [facility] [phone]       Medication List    ASK your doctor about these medications       LORazepam 1 MG tablet  Commonly known as:  ATIVAN  Take 1 mg by mouth every 8 (eight) hours as needed for anxiety.     pravastatin 20 MG tablet  Commonly known as:  PRAVACHOL  Take 2 tablets (40 mg total) by mouth daily. At bedtime     testosterone cypionate 200 MG/ML injection  Commonly known as:  DEPOTESTOTERONE CYPIONATE  Inject 1.5 mLs (300 mg total) into the muscle every 14 (fourteen) days.     traZODone 50 MG tablet  Commonly known as:  DESYREL  Take 100 mg by mouth at bedtime.     triamcinolone cream 0.1 %  Commonly known as:  KENALOG  Apply topically 2 (two) times daily.     VANISHPOINT SAFETY SYRINGE 22G X 1-1/2" 3 ML Misc  Generic drug:  SYRINGE-NEEDLE (DISP) 3 ML  USE AS DIRECTED TO SELF-ADMINISTER TESTOSTERONE         At home rest most of  the time. Get up 9 or 10 times each day and take a 15 or 20 minute walk. No riding in the car and to your first postoperative appointment. If you have neck surgery you may shower from the chest down starting on the third postoperative day. If you had back surgery he may start showering on the third postoperative day with saran wrap wrapped around your incisional area 3 times. After the shower remove the saran wrap. Take pain medicine as needed and other medications as instructed. Call my office for an appointment.  SignedFaythe Ghee, MD 04/25/2013, 10:17 AM

## 2013-04-25 NOTE — Progress Notes (Signed)
Discharge orders received. VSS. Patient given written and verbal education regarding follow up appointment, medications, s/s of infx, and incision care. Prescription given to patient's wife. No other needs identified at this time. Disposition-home with wife.   Seniyah Esker, [place] 04/25/2013 12:09 PM

## 2013-04-28 ENCOUNTER — Encounter (HOSPITAL_COMMUNITY): Payer: Self-pay | Admitting: Neurosurgery

## 2013-04-28 ENCOUNTER — Other Ambulatory Visit: Payer: Self-pay | Admitting: Family Medicine

## 2013-04-28 NOTE — Telephone Encounter (Signed)
Call --- rx ready for pick up. 

## 2013-04-29 NOTE — Telephone Encounter (Signed)
Faxed

## 2013-05-20 ENCOUNTER — Other Ambulatory Visit: Payer: Self-pay | Admitting: Physician Assistant

## 2013-06-17 ENCOUNTER — Other Ambulatory Visit: Payer: Self-pay | Admitting: Family Medicine

## 2013-06-17 NOTE — Telephone Encounter (Signed)
Please call in refill of Lorazepam as approved.   

## 2013-06-18 ENCOUNTER — Other Ambulatory Visit: Payer: Self-pay | Admitting: Family Medicine

## 2013-06-18 NOTE — Telephone Encounter (Signed)
Called in.

## 2013-06-28 ENCOUNTER — Other Ambulatory Visit: Payer: Self-pay | Admitting: Family Medicine

## 2013-07-12 ENCOUNTER — Other Ambulatory Visit: Payer: Self-pay | Admitting: Family Medicine

## 2013-07-21 ENCOUNTER — Encounter: Payer: BC Managed Care – PPO | Admitting: Family Medicine

## 2013-07-22 ENCOUNTER — Other Ambulatory Visit: Payer: Self-pay | Admitting: Family Medicine

## 2013-07-25 ENCOUNTER — Other Ambulatory Visit: Payer: Self-pay | Admitting: Family Medicine

## 2013-07-26 ENCOUNTER — Other Ambulatory Visit: Payer: Self-pay | Admitting: Family Medicine

## 2013-07-28 ENCOUNTER — Encounter: Payer: BC Managed Care – PPO | Admitting: Family Medicine

## 2013-08-05 ENCOUNTER — Encounter: Payer: Self-pay | Admitting: Family Medicine

## 2013-08-06 ENCOUNTER — Encounter: Payer: Self-pay | Admitting: Family Medicine

## 2013-08-06 ENCOUNTER — Ambulatory Visit (INDEPENDENT_AMBULATORY_CARE_PROVIDER_SITE_OTHER): Payer: BC Managed Care – PPO | Admitting: Family Medicine

## 2013-08-06 ENCOUNTER — Encounter: Payer: BC Managed Care – PPO | Admitting: Family Medicine

## 2013-08-06 VITALS — BP 126/91 | HR 113 | Temp 98.1°F | Resp 18 | Ht 72.0 in | Wt 204.2 lb

## 2013-08-06 DIAGNOSIS — Z0000 Encounter for general adult medical examination without abnormal findings: Secondary | ICD-10-CM

## 2013-08-06 DIAGNOSIS — E7800 Pure hypercholesterolemia, unspecified: Secondary | ICD-10-CM

## 2013-08-06 DIAGNOSIS — F411 Generalized anxiety disorder: Secondary | ICD-10-CM

## 2013-08-06 DIAGNOSIS — F419 Anxiety disorder, unspecified: Secondary | ICD-10-CM

## 2013-08-06 DIAGNOSIS — G4700 Insomnia, unspecified: Secondary | ICD-10-CM

## 2013-08-06 DIAGNOSIS — E291 Testicular hypofunction: Secondary | ICD-10-CM

## 2013-08-06 DIAGNOSIS — Z86010 Personal history of colonic polyps: Secondary | ICD-10-CM

## 2013-08-06 DIAGNOSIS — L989 Disorder of the skin and subcutaneous tissue, unspecified: Secondary | ICD-10-CM

## 2013-08-06 LAB — COMPREHENSIVE METABOLIC PANEL
ALK PHOS: 57 U/L (ref 39–117)
ALT: 30 U/L (ref 0–53)
AST: 17 U/L (ref 0–37)
Albumin: 4.7 g/dL (ref 3.5–5.2)
BILIRUBIN TOTAL: 0.9 mg/dL (ref 0.2–1.2)
BUN: 12 mg/dL (ref 6–23)
CHLORIDE: 99 meq/L (ref 96–112)
CO2: 25 mEq/L (ref 19–32)
CREATININE: 1.09 mg/dL (ref 0.50–1.35)
Calcium: 9.7 mg/dL (ref 8.4–10.5)
Glucose, Bld: 108 mg/dL — ABNORMAL HIGH (ref 70–99)
Potassium: 4.2 mEq/L (ref 3.5–5.3)
Sodium: 137 mEq/L (ref 135–145)
Total Protein: 7.2 g/dL (ref 6.0–8.3)

## 2013-08-06 LAB — CBC WITH DIFFERENTIAL/PLATELET
BASOS ABS: 0 10*3/uL (ref 0.0–0.1)
BASOS PCT: 0 % (ref 0–1)
EOS ABS: 0.1 10*3/uL (ref 0.0–0.7)
EOS PCT: 1 % (ref 0–5)
HCT: 48.4 % (ref 39.0–52.0)
Hemoglobin: 17.6 g/dL — ABNORMAL HIGH (ref 13.0–17.0)
Lymphocytes Relative: 17 % (ref 12–46)
Lymphs Abs: 1 10*3/uL (ref 0.7–4.0)
MCH: 31.4 pg (ref 26.0–34.0)
MCHC: 36.4 g/dL — AB (ref 30.0–36.0)
MCV: 86.4 fL (ref 78.0–100.0)
Monocytes Absolute: 0.5 10*3/uL (ref 0.1–1.0)
Monocytes Relative: 8 % (ref 3–12)
Neutro Abs: 4.5 10*3/uL (ref 1.7–7.7)
Neutrophils Relative %: 74 % (ref 43–77)
PLATELETS: 235 10*3/uL (ref 150–400)
RBC: 5.6 MIL/uL (ref 4.22–5.81)
RDW: 13.9 % (ref 11.5–15.5)
WBC: 6.1 10*3/uL (ref 4.0–10.5)

## 2013-08-06 LAB — LIPID PANEL
CHOL/HDL RATIO: 3.3 ratio
Cholesterol: 221 mg/dL — ABNORMAL HIGH (ref 0–200)
HDL: 68 mg/dL (ref >39)
LDL Cholesterol: 132 mg/dL — ABNORMAL HIGH (ref 0–99)
Triglycerides: 107 mg/dL (ref <150)
VLDL: 21 mg/dL (ref 0–40)

## 2013-08-06 LAB — POCT URINALYSIS DIPSTICK
Bilirubin, UA: NEGATIVE
Blood, UA: NEGATIVE
Glucose, UA: NEGATIVE
Ketones, UA: NEGATIVE
Leukocytes, UA: NEGATIVE
Nitrite, UA: NEGATIVE
PROTEIN UA: NEGATIVE
Spec Grav, UA: 1.015
UROBILINOGEN UA: 0.2
pH, UA: 7

## 2013-08-06 LAB — TSH: TSH: 1.022 u[IU]/mL (ref 0.350–4.500)

## 2013-08-06 MED ORDER — TRAZODONE HCL 50 MG PO TABS: 50.0000 mg | ORAL_TABLET | Freq: Every evening | ORAL | Status: DC | PRN

## 2013-08-06 MED ORDER — PRAVASTATIN SODIUM 20 MG PO TABS: 40.0000 mg | ORAL_TABLET | Freq: Every day | ORAL | Status: DC

## 2013-08-06 MED ORDER — LORAZEPAM 1 MG PO TABS: 0.5000 mg | ORAL_TABLET | Freq: Three times a day (TID) | ORAL | Status: DC | PRN

## 2013-08-06 MED ORDER — TESTOSTERONE CYPIONATE 200 MG/ML IM SOLN: INTRAMUSCULAR | Status: DC

## 2013-08-06 NOTE — Progress Notes (Signed)
Subjective:  This chart was scribed for Wardell Honour, MD by Ladene Artist, ED Scribe. The patient was seen in room 25. Patient's care was started at 11:33 AM.   Patient ID: Curtis Yang, male    DOB: 1960-07-29, 53 y.o.   MRN: [id number]  08/06/2013  Annual Exam and Medication Refill  HPI HPI Comments: Curtis Yang is a 53 y.o. male who presents to the [facility] for CPE.   Last CPE 07/08/12.  Colonoscopy was 05/01/07 by [name]. +polyps; repeat in 5 years; needs referral for repeat colonoscopy. Tdap 2013.  Shingles vaccine 01/21/13.  Flu vaccine 12/2012.  Last eye exam 06/19/2013 which was normal.  Dentist 07/28/13.   Last visit was 01/20/2013 for a 6 month ago follow up on high cholesterol, hypogonadism, anxiety. No changes to management were made in November. Pt has lost 14 lbs since last CPE. Status post lumbar fusion 04/22/13.  Pt states that he started physical therapy for lower back Tuesday, he goes x 2-3 week over the next 3 weeks. He plans to return to work in August. He has been out since December 2014. Pt states that he does some work around the house and walks. He states that his leg and foot pain have resolved. Pt states that he last took pain medication 3-4 days ago, he takes it as needed. Pt reports mild pain in the surgical area. He reports normal HAs, neck stiffness, intermittent abdominal pain that he experiences a few times a week, urinary frequency, previous tinnitus. Pt states that he goes to bed around 9-10 PM and drinks water up until he goes to bed. He denies nausea, vomiting, heartburn, blood in stools, melena, dysuria, difficulty urinating, dizziness, mouth sores, chest pain, SOB, cough, neck pain, leg swelling, penile swelling, scrotal swelling, issues with erection, change in sexual desire. Pt saw Dr. Everlene Farrier for snoring that pt attributes to nasal saline; snoring has resolved.   Pt reports elevated pulse rate with coming to see a  doctor. Pt does not check his BP at home. He checks his BP at [facility] with his readings being 130. Highest pulse was over 100. Pt states that he went to Dr. Sande Rives office with BP over 160.   Pt presents with a spot on R chest that he noted a while ago. Pt states that the spot is growing. He has never seen a dermatologist. He reports having a similar spot on his nose that was precancerous and frozen by Dr. Everlene Farrier. He also reports a similar spot on his L arm and rash on forehead onset a few days ago.  Pt expresses his desire to be referred for a colonoscopy. Pt states that his last colonoscopy showed approximately 5 polyps.   Pt states that he takes 2 Trazodone tablets and melatonin with mild relief for insomnia. He also takes Lorazepam x 3, cholesterol medication nightly, ASA every morning, Prilosec as needed, 10 mg Oxycodone as needed.  Pt's brother is 30 y.o with a h/o anxiety. Pt has not spoken to him in years. Pt does not know much about his mother. He was raised by his grandmother who passed. Pt's father passed in a car accident when he was 53 y.o. Paternal grandfather passed of prostate cancer in 50s/60s.   No known allergies. Pt denies alcohol use or smoking. Pt walks 1.5 miles x 2 days a week.   Review of Systems  Constitutional: Positive for activity change.  HENT: Positive for hearing loss  and tinnitus. Negative for mouth sores.   Eyes: Negative.   Respiratory: Negative.  Negative for cough and shortness of breath.   Cardiovascular: Negative.  Negative for chest pain and leg swelling.  Gastrointestinal: Positive for abdominal pain (intermittent). Negative for nausea, vomiting and blood in stool.  Endocrine: Positive for polyuria.  Genitourinary: Positive for frequency. Negative for dysuria, penile swelling, scrotal swelling and difficulty urinating.  Musculoskeletal: Positive for back pain and neck stiffness. Negative for neck pain.  Skin: Positive for rash.  Allergic/Immunologic:  Negative.   Neurological: Positive for headaches. Negative for dizziness.  Hematological: Negative.   Psychiatric/Behavioral: Positive for sleep disturbance.   Past Medical History  Diagnosis Date  . Hypogonadism male   . Hyperlipidemia   . Depression   . Insomnia   . Benign skin lesion of nose   . Increased serum lipids   . Low back pain   . Erectile dysfunction     Due to decreased testosterone   . Increased glucose level   . Low testosterone   . History of colon polyps   . Anxiety   . Arthritis     DDD lumbar.  . Previous back surgery   . GERD (gastroesophageal reflux disease)    Past Surgical History  Procedure Laterality Date  . Eye surgery  02/28/2000    Lasik  . Colonoscopy w/ polypectomy  05/03/2007    five polyps; repeat in 5 years; [name].  [name] Spine surgery  09/26/2012    [facility] [place].  . Transforaminal lumbar interbody fusion (tlif) with pedicle screw fixation 1 level Right 04/22/2013    Procedure: TRANSFORAMINAL LUMBAR INTERBODY FUSION (TLIF) WITH PEDICLE SCREW FIXATION LUMBAR FIVE SACRAL ONE;  Surgeon: Faythe Ghee, MD;  Location: [facility];  Service: Neurosurgery;  Laterality: Right;    No Known Allergies Current Outpatient Prescriptions  Medication Sig Dispense Refill  . LORazepam (ATIVAN) 1 MG tablet Take 0.5-1 tablets (0.5-1 mg total) by mouth every 8 (eight) hours as needed for anxiety.  90 tablet  5  . omeprazole (PRILOSEC) 20 MG capsule Take 1 capsule (20 mg total) by mouth daily. PATIENT NEEDS OFFICE VISIT FOR ADDITIONAL REFILLS  30 capsule  0  . oxyCODONE-acetaminophen (PERCOCET/ROXICET) 5-325 MG per tablet Take 1-2 tablets by mouth every 4 (four) hours as needed for moderate pain.  60 tablet  0  . pravastatin (PRAVACHOL) 20 MG tablet Take 2 tablets (40 mg total) by mouth daily. PATIENT NEEDS OFFICE VISIT FOR ADDITIONAL REFILLS  60 tablet  11  . testosterone cypionate (DEPOTESTOTERONE CYPIONATE) 200 MG/ML injection  INJECT 1.5 MLS INTO THE MUSCLE EVERY 14 DAYS  10 mL  5  . traZODone (DESYREL) 50 MG tablet Take 100 mg by mouth at bedtime.      . traZODone (DESYREL) 50 MG tablet Take 1-2 tablets (50-100 mg total) by mouth at bedtime as needed for sleep.  60 tablet  11  . triamcinolone cream (KENALOG) 0.1 % Apply topically 2 (two) times daily.  45 g  1  . VANISHPOINT SAFETY SYRINGE 22G X 1-1/2" 3 ML MISC USE AS DIRECTED TO SELF-ADMINISTER TESTOSTERONE  50 each  0  . cyclobenzaprine (FLEXERIL) 10 MG tablet Take 1 tablet (10 mg total) by mouth 3 (three) times daily as needed for muscle spasms.  30 tablet  0   Current Facility-Administered Medications  Medication Dose Route Frequency Provider Last Rate Last Dose  . testosterone cypionate (DEPOTESTOTERONE CYPIONATE) injection 400 mg  400 mg Intramuscular W73 Days Beatriz Chancellor, PA-C   400 mg at 10/01/11 7106   History   Social History  . Marital Status: Married    Spouse Name: N/A    Number of Children: N/A  . Years of Education: N/A   Occupational History  . Not on file.   Social History Main Topics  . Smoking status: Never Smoker   . Smokeless tobacco: Never Used  . Alcohol Use: No  . Drug Use: No  . Sexual Activity: Yes     Comment: number of sex partners in the last 12 months  1   Other Topics Concern  . Not on file   Social History Narrative   Marital status:  Married x 28 years; happily married; second marriage; first wife died AMI age 36.        Children:  1 child; 3 stepchildren.  Several grandchildren.      Lives: with wife.      Employment:  [employer]; Print production planner x 16 years; happy.      Tobacco: never       Alcohol:  None      Drugs: none      Exercise:  Walking twice daily 3 miles total each day.        Seatbelt: 100%      Guns: yes; unloaded; in safe.      Sunscreen: SPF on face.   Family History  Problem Relation Age of Onset  . Heart disease Maternal Grandmother   . Hypertension Maternal Grandmother   .  Stroke Maternal Grandfather   . Anxiety disorder Mother   . Cancer Paternal Grandfather     prostate  . Anxiety disorder Brother   . Stroke Paternal Grandmother        Objective:    Triage Vitals: BP 126/91  Pulse 113  Temp(Src) 98.1 F (36.7 C) (Oral)  Resp 18  Ht 6' (1.829 m)  Wt 204 lb 3.2 oz (92.625 kg)  BMI 27.69 kg/m2  SpO2 96% Physical Exam  Nursing note and vitals reviewed. Constitutional: He is oriented to person, place, and time. He appears well-developed and well-nourished. No distress.  HENT:  Head: Normocephalic and atraumatic.  Right Ear: External ear normal.  Left Ear: External ear normal.  Nose: Nose normal.  Mouth/Throat: Oropharynx is clear and moist.  Eyes: Conjunctivae and EOM are normal. Pupils are equal, round, and reactive to light.  Neck: Normal range of motion. Neck supple. Carotid bruit is not present. No thyromegaly present.  Cardiovascular: Regular rhythm, normal heart sounds and intact distal pulses.  Tachycardia present.  Exam reveals no gallop and no friction rub.   No murmur heard. Tachycardic at 120  Pulmonary/Chest: Effort normal and breath sounds normal. He has no wheezes. He has no rales.  Abdominal: Soft. Bowel sounds are normal. He exhibits no distension and no mass. There is no tenderness. There is no rebound and no guarding. Hernia confirmed negative in the right inguinal area and confirmed negative in the left inguinal area.  Genitourinary: Rectum normal, prostate normal and penis normal. Prostate is not enlarged and not tender. Right testis shows no mass, no swelling and no tenderness. Left testis shows no mass, no swelling and no tenderness. Circumcised.  Bilateral testicular atrophy  Musculoskeletal: Normal range of motion.       Right shoulder: Normal.       Left shoulder: Normal.       Cervical back: Normal.  Lymphadenopathy:  He has no cervical adenopathy.       Right: No inguinal adenopathy present.       Left: No inguinal  adenopathy present.  Neurological: He is alert and oriented to person, place, and time. He has normal reflexes. No cranial nerve deficit. He exhibits normal muscle tone. Coordination normal.  Skin: Skin is warm and dry. No rash noted. He is not diaphoretic.  Anterior chest with sun related changes diffusely but mild; scattered seborrhea keratoses present with some color variation.  Psychiatric: He has a normal mood and affect. His behavior is normal. Judgment and thought content normal.   EKG: NSR; rate 101; no ST changes.    Assessment & Plan:   1. Routine general medical examination at a health care facility   2. Pure hypercholesterolemia   3. Hypogonadism male   4. Personal history of colonic polyps   5. Anxiety   6. Insomnia   7. Skin lesions    1.  Complete Physical Examination:  Anticipatory guidance ---continued exercise, weight maintenance.  Immunizations UTD and reviewed.  Refer for repeat colonoscopy. 2.  Hypercholesterolemia: controlled; obtain labs; refill provided.  Follow up in six months. 3.  Hypogonadism: controlled on current dose of testosterone; refill provided; obtain labs including PSA, LFTs.  Follow up in six months. 4.  History of colon polyps: stable; overdue for repeat colonoscopy; referral placed. 5.  Anxiety; stable despite two surgeries in one year; refill provided. Follow up in six months. 6.  Insomnia: moderately controlled; refill of Trazodone provided; continue Melatonin as well. 7. Skin lesions scattered multiple: New. Refer to dermatology to evaluate various skin lesions.  Meds ordered this encounter  Medications  . LORazepam (ATIVAN) 1 MG tablet    Sig: Take 0.5-1 tablets (0.5-1 mg total) by mouth every 8 (eight) hours as needed for anxiety.    Dispense:  90 tablet    Refill:  5  . pravastatin (PRAVACHOL) 20 MG tablet    Sig: Take 2 tablets (40 mg total) by mouth daily. PATIENT NEEDS OFFICE VISIT FOR ADDITIONAL REFILLS    Dispense:  60 tablet     Refill:  11  . testosterone cypionate (DEPOTESTOTERONE CYPIONATE) 200 MG/ML injection    Sig: INJECT 1.5 MLS INTO THE MUSCLE EVERY 14 DAYS    Dispense:  10 mL    Refill:  5  . traZODone (DESYREL) 50 MG tablet    Sig: Take 1-2 tablets (50-100 mg total) by mouth at bedtime as needed for sleep.    Dispense:  60 tablet    Refill:  11    Return in about 6 months (around 02/05/2014) for recheck cholesterol, anxiety, low testosterone.   I personally performed the services described in this documentation, which was scribed in my presence. The recorded information has been reviewed and is accurate.  Reginia Forts, M.D.  [facility] [address] [place], [place]  [address] [phone] phone [phone] fax

## 2013-08-07 LAB — TESTOSTERONE, FREE, TOTAL, SHBG
Sex Hormone Binding: 36 nmol/L (ref 13–71)
Testosterone, Free: 267.1 pg/mL — ABNORMAL HIGH (ref 47.0–244.0)
Testosterone-% Free: 2.4 % (ref 1.6–2.9)
Testosterone: 1126 ng/dL — ABNORMAL HIGH (ref 300–890)

## 2013-08-07 LAB — HEMOGLOBIN A1C
Hgb A1c MFr Bld: 5.7 % — ABNORMAL HIGH (ref <5.7)
Mean Plasma Glucose: 117 mg/dL — ABNORMAL HIGH (ref <117)

## 2013-08-07 LAB — PSA: PSA: 1.55 ng/mL (ref <=4.00)

## 2013-08-12 ENCOUNTER — Encounter: Payer: Self-pay | Admitting: Family Medicine

## 2013-08-12 ENCOUNTER — Encounter: Payer: Self-pay | Admitting: Gastroenterology

## 2013-08-19 ENCOUNTER — Other Ambulatory Visit: Payer: Self-pay | Admitting: Family Medicine

## 2013-09-01 ENCOUNTER — Other Ambulatory Visit: Payer: Self-pay | Admitting: Family Medicine

## 2013-09-02 NOTE — Telephone Encounter (Signed)
Faxed

## 2013-09-02 NOTE — Telephone Encounter (Signed)
Please fax rx to pharmacy or call into pharmacy.

## 2013-09-19 ENCOUNTER — Ambulatory Visit (AMBULATORY_SURGERY_CENTER): Payer: Self-pay | Admitting: *Deleted

## 2013-09-19 ENCOUNTER — Encounter: Payer: Self-pay | Admitting: Gastroenterology

## 2013-09-19 VITALS — Ht 72.0 in | Wt 208.0 lb

## 2013-09-19 DIAGNOSIS — Z86010 Personal history of colonic polyps: Secondary | ICD-10-CM

## 2013-09-19 MED ORDER — MOVIPREP 100 G PO SOLR: 1.0000 | Freq: Once | ORAL | Status: DC

## 2013-09-19 NOTE — Progress Notes (Signed)
No egg or soy allergy. No anesthesia problems.  No home O2.  No diet meds.  

## 2013-09-27 HISTORY — PX: COLONOSCOPY W/ POLYPECTOMY: SHX1380

## 2013-10-03 ENCOUNTER — Encounter: Payer: Self-pay | Admitting: Gastroenterology

## 2013-10-03 ENCOUNTER — Ambulatory Visit (AMBULATORY_SURGERY_CENTER): Payer: BC Managed Care – PPO | Admitting: Gastroenterology

## 2013-10-03 VITALS — BP 120/97 | HR 82 | Temp 97.6°F | Resp 25 | Ht 72.0 in | Wt 208.0 lb

## 2013-10-03 DIAGNOSIS — D126 Benign neoplasm of colon, unspecified: Secondary | ICD-10-CM

## 2013-10-03 DIAGNOSIS — Z86010 Personal history of colonic polyps: Secondary | ICD-10-CM

## 2013-10-03 MED ORDER — SODIUM CHLORIDE 0.9 % IV SOLN: 500.0000 mL | INTRAVENOUS | Status: DC

## 2013-10-03 NOTE — Progress Notes (Signed)
Called to room to assist during endoscopic procedure.  Patient ID and intended procedure confirmed with present staff. Received instructions for my participation in the procedure from the performing physician.  

## 2013-10-03 NOTE — Patient Instructions (Signed)
YOU HAD AN ENDOSCOPIC PROCEDURE TODAY AT THE [facility]: Refer to the procedure report that was given to you for any specific questions about what was found during the examination.  If the procedure report does not answer your questions, please call your gastroenterologist to clarify.  If you requested that your care partner not be given the details of your procedure findings, then the procedure report has been included in a sealed envelope for you to review at your convenience later.  YOU SHOULD EXPECT: Some feelings of bloating in the abdomen. Passage of more gas than usual.  Walking can help get rid of the air that was put into your GI tract during the procedure and reduce the bloating. If you had a lower endoscopy (such as a colonoscopy or flexible sigmoidoscopy) you may notice spotting of blood in your stool or on the toilet paper. If you underwent a bowel prep for your procedure, then you may not have a normal bowel movement for a few days.  DIET: Your first meal following the procedure should be a light meal and then it is ok to progress to your normal diet.  A half-sandwich or bowl of soup is an example of a good first meal.  Heavy or fried foods are harder to digest and may make you feel nauseous or bloated.  Likewise meals heavy in dairy and vegetables can cause extra gas to form and this can also increase the bloating.  Drink plenty of fluids but you should avoid alcoholic beverages for 24 hours.  ACTIVITY: Your care partner should take you home directly after the procedure.  You should plan to take it easy, moving slowly for the rest of the day.  You can resume normal activity the day after the procedure however you should NOT DRIVE or use heavy machinery for 24 hours (because of the sedation medicines used during the test).    SYMPTOMS TO REPORT IMMEDIATELY: A gastroenterologist can be reached at any hour.  During normal business hours, 8:30 AM to 5:00 PM Monday through Friday,  call [phone].  After hours and on weekends, please call the GI answering service at [phone] who will take a message and have the physician on call contact you.   Following lower endoscopy (colonoscopy or flexible sigmoidoscopy):  Excessive amounts of blood in the stool  Significant tenderness or worsening of abdominal pains  Swelling of the abdomen that is new, acute  Fever of 100F or higher    FOLLOW UP: If any biopsies were taken you will be contacted by phone or by letter within the next 1-3 weeks.  Call your gastroenterologist if you have not heard about the biopsies in 3 weeks.  Our staff will call the home number listed on your records the next business day following your procedure to check on you and address any questions or concerns that you may have at that time regarding the information given to you following your procedure. This is a courtesy call and so if there is no answer at the home number and we have not heard from you through the emergency physician on call, we will assume that you have returned to your regular daily activities without incident.  SIGNATURES/CONFIDENTIALITY: You and/or your care partner have signed paperwork which will be entered into your electronic medical record.  These signatures attest to the fact that that the information above on your After Visit Summary has been reviewed and is understood.  Full responsibility of the confidentiality   of this discharge information lies with you and/or your care-partner.   INFORMATION ON POLYPS AND HEMORRHOIDS GIVEN TO YOU TODAY

## 2013-10-03 NOTE — Progress Notes (Signed)
Stable to RR 

## 2013-10-03 NOTE — Op Note (Signed)
[place]  [address] [place], [address]   COLONOSCOPY PROCEDURE REPORT  PATIENT: Curtis, Yang  MR#: [id number] BIRTHDATE: [birth year] , 55  yrs. old GENDER: Male ENDOSCOPIST: Ladene Artist, MD, [facility] PROCEDURE DATE:  10/03/2013 PROCEDURE:   Colonoscopy with snare polypectomy First Screening Colonoscopy - Avg.  risk and is 50 yrs.  old or older - No.  Prior Negative Screening - Now for repeat screening. N/A  History of Adenoma - Now for follow-up colonoscopy & has been > or = to 3 yrs.  Yes hx of adenoma.  Has been 3 or more years since last colonoscopy.  Polyps Removed Today? Yes. ASA CLASS:   Class II INDICATIONS:Patient's personal history of adenomatous colon polyps.  MEDICATIONS: MAC sedation, administered by CRNA and propofol (Diprivan) 300mg  IV DESCRIPTION OF PROCEDURE:   After the risks benefits and alternatives of the procedure were thoroughly explained, informed consent was obtained.  A digital rectal exam revealed no abnormalities of the rectum.   The LB [id number]  endoscope was introduced through the anus and advanced to the cecum, which was identified by both the appendix and ileocecal valve. No adverse events experienced.   The quality of the prep was excellent, using MoviPrep  The instrument was then slowly withdrawn as the colon was fully examined.  COLON FINDINGS: A sessile polyp measuring 6 mm in size was found in the descending colon.  A polypectomy was performed with a cold snare.  The resection was complete and the polyp tissue was completely retrieved.   The colon was otherwise normal.  There was no diverticulosis, inflammation, polyps or cancers unless previously stated.  Retroflexed views revealed moderate internal hemorrhoids. The time to cecum=2 minutes 15 seconds.  Withdrawal time=9 minutes 19 seconds.  The scope was withdrawn and the procedure completed. COMPLICATIONS: There were no complications.  ENDOSCOPIC  IMPRESSION: 1.   Sessile polyp measuring 6 mm in the descending colon; polypectomy performed with a cold snare 2.   Moderate internal hemorrhoids  RECOMMENDATIONS: 1.  Await pathology results 2.  Repeat Colonoscopy in 5 years.  eSigned:  Ladene Artist, MD, [name] 10/03/2013 10:12 AM   cc: Reginia Forts, MD

## 2013-10-06 ENCOUNTER — Telehealth: Payer: Self-pay | Admitting: *Deleted

## 2013-10-06 NOTE — Telephone Encounter (Signed)
  Follow up Call-  Call back number 10/03/2013  Post procedure Call Back phone  # [phone]  Permission to leave phone message Yes     No answer at # given.  Left message on VM.

## 2013-10-11 ENCOUNTER — Encounter: Payer: Self-pay | Admitting: Gastroenterology

## 2013-12-05 ENCOUNTER — Ambulatory Visit (INDEPENDENT_AMBULATORY_CARE_PROVIDER_SITE_OTHER): Payer: BC Managed Care – PPO | Admitting: Physician Assistant

## 2013-12-05 VITALS — BP 122/70 | HR 92 | Temp 98.5°F | Resp 18 | Ht 72.0 in | Wt 194.4 lb

## 2013-12-05 DIAGNOSIS — M545 Low back pain, unspecified: Secondary | ICD-10-CM

## 2013-12-05 DIAGNOSIS — Z23 Encounter for immunization: Secondary | ICD-10-CM

## 2013-12-05 DIAGNOSIS — S39012A Strain of muscle, fascia and tendon of lower back, initial encounter: Secondary | ICD-10-CM

## 2013-12-05 DIAGNOSIS — Z8739 Personal history of other diseases of the musculoskeletal system and connective tissue: Secondary | ICD-10-CM

## 2013-12-05 MED ORDER — NAPROXEN 500 MG PO TABS: 500.0000 mg | ORAL_TABLET | Freq: Two times a day (BID) | ORAL | Status: DC

## 2013-12-05 MED ORDER — CYCLOBENZAPRINE HCL 5 MG PO TABS: 5.0000 mg | ORAL_TABLET | Freq: Three times a day (TID) | ORAL | Status: DC | PRN

## 2013-12-05 MED ORDER — HYDROCODONE-ACETAMINOPHEN 5-325 MG PO TABS: 1.0000 | ORAL_TABLET | Freq: Four times a day (QID) | ORAL | Status: DC | PRN

## 2013-12-05 NOTE — Patient Instructions (Addendum)
Below are some exercises and stretches we recommend for a low back strain. These are exercises that will help to stretch your low back and strengthen your core. Please do them twice daily.  Please take your flexeril and naproxen as prescribed for your muscle strain. Please take the norco as needed for pain. Remember to lift with your knees while at work. Feel free to apply both heat and ice for the next week.      Low Back Strain with Rehab A strain is an injury in which a tendon or muscle is torn. The muscles and tendons of the lower back are vulnerable to strains. However, these muscles and tendons are very strong and require a great force to be injured. Strains are classified into three categories. Grade 1 strains cause pain, but the tendon is not lengthened. Grade 2 strains include a lengthened ligament, due to the ligament being stretched or partially ruptured. With grade 2 strains there is still function, although the function may be decreased. Grade 3 strains involve a complete tear of the tendon or muscle, and function is usually impaired. SYMPTOMS   Pain in the lower back.  Pain that affects one side more than the other.  Pain that gets worse with movement and may be felt in the hip, buttocks, or back of the thigh.  Muscle spasms of the muscles in the back.  Swelling along the muscles of the back.  Loss of strength of the back muscles.  Crackling sound (crepitation) when the muscles are touched. CAUSES  Lower back strains occur when a force is placed on the muscles or tendons that is greater than they can handle. Common causes of injury include:  Prolonged overuse of the muscle-tendon units in the lower back, usually from incorrect posture.  A single violent injury or force applied to the back. RISK INCREASES WITH:  Sports that involve twisting forces on the spine or a lot of bending at the waist (football, rugby, weightlifting, bowling, golf, tennis, speed skating,  racquetball, swimming, running, gymnastics, diving).  Poor strength and flexibility.  Failure to warm up properly before activity.  Family history of lower back pain or disk disorders.  Previous back injury or surgery (especially fusion).  Poor posture with lifting, especially heavy objects.  Prolonged sitting, especially with poor posture. PREVENTION   Learn and use proper posture when sitting or lifting (maintain proper posture when sitting, lift using the knees and legs, not at the waist).  Warm up and stretch properly before activity.  Allow for adequate recovery between workouts.  Maintain physical fitness:  Strength, flexibility, and endurance.  Cardiovascular fitness. PROGNOSIS  If treated properly, lower back strains usually heal within 6 weeks. RELATED COMPLICATIONS   Recurring symptoms, resulting in a chronic problem.  Chronic inflammation, scarring, and partial muscle-tendon tear.  Delayed healing or resolution of symptoms.  Prolonged disability. TREATMENT  Treatment first involves the use of ice and medicine, to reduce pain and inflammation. The use of strengthening and stretching exercises may help reduce pain with activity. These exercises may be performed at home or with a therapist. Severe injuries may require referral to a therapist for further evaluation and treatment, such as ultrasound. Your caregiver may advise that you wear a back brace or corset, to help reduce pain and discomfort. Often, prolonged bed rest results in greater harm then benefit. Corticosteroid injections may be recommended. However, these should be reserved for the most serious cases. It is important to avoid using your back when  lifting objects. At night, sleep on your back on a firm mattress with a pillow placed under your knees. If non-surgical treatment is unsuccessful, surgery may be needed.  MEDICATION   If pain medicine is needed, nonsteroidal anti-inflammatory medicines (aspirin  and ibuprofen), or other minor pain relievers (acetaminophen), are often advised.  Do not take pain medicine for 7 days before surgery.  Prescription pain relievers may be given, if your caregiver thinks they are needed. Use only as directed and only as much as you need.  Ointments applied to the skin may be helpful.  Corticosteroid injections may be given by your caregiver. These injections should be reserved for the most serious cases, because they may only be given a certain number of times. HEAT AND COLD  Cold treatment (icing) should be applied for 10 to 15 minutes every 2 to 3 hours for inflammation and pain, and immediately after activity that aggravates your symptoms. Use ice packs or an ice massage.  Heat treatment may be used before performing stretching and strengthening activities prescribed by your caregiver, physical therapist, or athletic trainer. Use a heat pack or a warm water soak. SEEK MEDICAL CARE IF:   Symptoms get worse or do not improve in 2 to 4 weeks, despite treatment.  You develop numbness, weakness, or loss of bowel or bladder function.  New, unexplained symptoms develop. (Drugs used in treatment may produce side effects.) EXERCISES  RANGE OF MOTION (ROM) AND STRETCHING EXERCISES - Low Back Strain Most people with lower back pain will find that their symptoms get worse with excessive bending forward (flexion) or arching at the lower back (extension). The exercises which will help resolve your symptoms will focus on the opposite motion.  Your physician, physical therapist or athletic trainer will help you determine which exercises will be most helpful to resolve your lower back pain. Do not complete any exercises without first consulting with your caregiver. Discontinue any exercises which make your symptoms worse until you speak to your caregiver.  If you have pain, numbness or tingling which travels down into your buttocks, leg or foot, the goal of the therapy is  for these symptoms to move closer to your back and eventually resolve. Sometimes, these leg symptoms will get better, but your lower back pain may worsen. This is typically an indication of progress in your rehabilitation. Be very alert to any changes in your symptoms and the activities in which you participated in the 24 hours prior to the change. Sharing this information with your caregiver will allow him/her to most efficiently treat your condition.  These exercises may help you when beginning to rehabilitate your injury. Your symptoms may resolve with or without further involvement from your physician, physical therapist or athletic trainer. While completing these exercises, remember:  Restoring tissue flexibility helps normal motion to return to the joints. This allows healthier, less painful movement and activity.  An effective stretch should be held for at least 30 seconds.  A stretch should never be painful. You should only feel a gentle lengthening or release in the stretched tissue. FLEXION RANGE OF MOTION AND STRETCHING EXERCISES: STRETCH - Flexion, Single Knee to Chest   Lie on a firm bed or floor with both legs extended in front of you.  Keeping one leg in contact with the floor, bring your opposite knee to your chest. Hold your leg in place by either grabbing behind your thigh or at your knee.  Pull until you feel a gentle stretch in your  lower back. Hold __________ seconds.  Slowly release your grasp and repeat the exercise with the opposite side. Repeat __________ times. Complete this exercise __________ times per day.  STRETCH - Flexion, Double Knee to Chest   Lie on a firm bed or floor with both legs extended in front of you.  Keeping one leg in contact with the floor, bring your opposite knee to your chest.  Tense your stomach muscles to support your back and then lift your other knee to your chest. Hold your legs in place by either grabbing behind your thighs or at your  knees.  Pull both knees toward your chest until you feel a gentle stretch in your lower back. Hold __________ seconds.  Tense your stomach muscles and slowly return one leg at a time to the floor. Repeat __________ times. Complete this exercise __________ times per day.  STRETCH - Low Trunk Rotation  Lie on a firm bed or floor. Keeping your legs in front of you, bend your knees so they are both pointed toward the ceiling and your feet are flat on the floor.  Extend your arms out to the side. This will stabilize your upper body by keeping your shoulders in contact with the floor.  Gently and slowly drop both knees together to one side until you feel a gentle stretch in your lower back. Hold for __________ seconds.  Tense your stomach muscles to support your lower back as you bring your knees back to the starting position. Repeat the exercise to the other side. Repeat __________ times. Complete this exercise __________ times per day  EXTENSION RANGE OF MOTION AND FLEXIBILITY EXERCISES: STRETCH - Extension, Prone on Elbows   Lie on your stomach on the floor, a bed will be too soft. Place your palms about shoulder width apart and at the height of your head.  Place your elbows under your shoulders. If this is too painful, stack pillows under your chest.  Allow your body to relax so that your hips drop lower and make contact more completely with the floor.  Hold this position for __________ seconds.  Slowly return to lying flat on the floor. Repeat __________ times. Complete this exercise __________ times per day.  RANGE OF MOTION - Extension, Prone Press Ups  Lie on your stomach on the floor, a bed will be too soft. Place your palms about shoulder width apart and at the height of your head.  Keeping your back as relaxed as possible, slowly straighten your elbows while keeping your hips on the floor. You may adjust the placement of your hands to maximize your comfort. As you gain motion,  your hands will come more underneath your shoulders.  Hold this position __________ seconds.  Slowly return to lying flat on the floor. Repeat __________ times. Complete this exercise __________ times per day.  RANGE OF MOTION- Quadruped, Neutral Spine   Assume a hands and knees position on a firm surface. Keep your hands under your shoulders and your knees under your hips. You may place padding under your knees for comfort.  Drop your head and point your tail bone toward the ground below you. This will round out your lower back like an angry cat. Hold this position for __________ seconds.  Slowly lift your head and release your tail bone so that your back sags into a large arch, like an old horse.  Hold this position for __________ seconds.  Repeat this until you feel limber in your lower back.  Now,  find your "sweet spot." This will be the most comfortable position somewhere between the two previous positions. This is your neutral spine. Once you have found this position, tense your stomach muscles to support your lower back.  Hold this position for __________ seconds. Repeat __________ times. Complete this exercise __________ times per day.  STRENGTHENING EXERCISES - Low Back Strain These exercises may help you when beginning to rehabilitate your injury. These exercises should be done near your "sweet spot." This is the neutral, low-back arch, somewhere between fully rounded and fully arched, that is your least painful position. When performed in this safe range of motion, these exercises can be used for people who have either a flexion or extension based injury. These exercises may resolve your symptoms with or without further involvement from your physician, physical therapist or athletic trainer. While completing these exercises, remember:   Muscles can gain both the endurance and the strength needed for everyday activities through controlled exercises.  Complete these exercises as  instructed by your physician, physical therapist or athletic trainer. Increase the resistance and repetitions only as guided.  You may experience muscle soreness or fatigue, but the pain or discomfort you are trying to eliminate should never worsen during these exercises. If this pain does worsen, stop and make certain you are following the directions exactly. If the pain is still present after adjustments, discontinue the exercise until you can discuss the trouble with your caregiver. STRENGTHENING - Deep Abdominals, Pelvic Tilt  Lie on a firm bed or floor. Keeping your legs in front of you, bend your knees so they are both pointed toward the ceiling and your feet are flat on the floor.  Tense your lower abdominal muscles to press your lower back into the floor. This motion will rotate your pelvis so that your tail bone is scooping upwards rather than pointing at your feet or into the floor.  With a gentle tension and even breathing, hold this position for __________ seconds. Repeat __________ times. Complete this exercise __________ times per day.  STRENGTHENING - Abdominals, Crunches   Lie on a firm bed or floor. Keeping your legs in front of you, bend your knees so they are both pointed toward the ceiling and your feet are flat on the floor. Cross your arms over your chest.  Slightly tip your chin down without bending your neck.  Tense your abdominals and slowly lift your trunk high enough to just clear your shoulder blades. Lifting higher can put excessive stress on the lower back and does not further strengthen your abdominal muscles.  Control your return to the starting position. Repeat __________ times. Complete this exercise __________ times per day.  STRENGTHENING - Quadruped, Opposite UE/LE Lift   Assume a hands and knees position on a firm surface. Keep your hands under your shoulders and your knees under your hips. You may place padding under your knees for comfort.  Find your  neutral spine and gently tense your abdominal muscles so that you can maintain this position. Your shoulders and hips should form a rectangle that is parallel with the floor and is not twisted.  Keeping your trunk steady, lift your right hand no higher than your shoulder and then your left leg no higher than your hip. Make sure you are not holding your breath. Hold this position __________ seconds.  Continuing to keep your abdominal muscles tense and your back steady, slowly return to your starting position. Repeat with the opposite arm and leg. Repeat __________  times. Complete this exercise __________ times per day.  STRENGTHENING - Lower Abdominals, Double Knee Lift  Lie on a firm bed or floor. Keeping your legs in front of you, bend your knees so they are both pointed toward the ceiling and your feet are flat on the floor.  Tense your abdominal muscles to brace your lower back and slowly lift both of your knees until they come over your hips. Be certain not to hold your breath.  Hold __________ seconds. Using your abdominal muscles, return to the starting position in a slow and controlled manner. Repeat __________ times. Complete this exercise __________ times per day.  POSTURE AND BODY MECHANICS CONSIDERATIONS - Low Back Strain Keeping correct posture when sitting, standing or completing your activities will reduce the stress put on different body tissues, allowing injured tissues a chance to heal and limiting painful experiences. The following are general guidelines for improved posture. Your physician or physical therapist will provide you with any instructions specific to your needs. While reading these guidelines, remember:  The exercises prescribed by your provider will help you have the flexibility and strength to maintain correct postures.  The correct posture provides the best environment for your joints to work. All of your joints have less wear and tear when properly supported by a  spine with good posture. This means you will experience a healthier, less painful body.  Correct posture must be practiced with all of your activities, especially prolonged sitting and standing. Correct posture is as important when doing repetitive low-stress activities (typing) as it is when doing a single heavy-load activity (lifting). RESTING POSITIONS Consider which positions are most painful for you when choosing a resting position. If you have pain with flexion-based activities (sitting, bending, stooping, squatting), choose a position that allows you to rest in a less flexed posture. You would want to avoid curling into a fetal position on your side. If your pain worsens with extension-based activities (prolonged standing, working overhead), avoid resting in an extended position such as sleeping on your stomach. Most people will find more comfort when they rest with their spine in a more neutral position, neither too rounded nor too arched. Lying on a non-sagging bed on your side with a pillow between your knees, or on your back with a pillow under your knees will often provide some relief. Keep in mind, being in any one position for a prolonged period of time, no matter how correct your posture, can still lead to stiffness. PROPER SITTING POSTURE In order to minimize stress and discomfort on your spine, you must sit with correct posture. Sitting with good posture should be effortless for a healthy body. Returning to good posture is a gradual process. Many people can work toward this most comfortably by using various supports until they have the flexibility and strength to maintain this posture on their own. When sitting with proper posture, your ears will fall over your shoulders and your shoulders will fall over your hips. You should use the back of the chair to support your upper back. Your lower back will be in a neutral position, just slightly arched. You may place a small pillow or folded towel  at the base of your lower back for support.  When working at a desk, create an environment that supports good, upright posture. Without extra support, muscles tire, which leads to excessive strain on joints and other tissues. Keep these recommendations in mind: CHAIR:  A chair should be able to slide under your  desk when your back makes contact with the back of the chair. This allows you to work closely.  The chair's height should allow your eyes to be level with the upper part of your monitor and your hands to be slightly lower than your elbows. BODY POSITION  Your feet should make contact with the floor. If this is not possible, use a foot rest.  Keep your ears over your shoulders. This will reduce stress on your neck and lower back. INCORRECT SITTING POSTURES  If you are feeling tired and unable to assume a healthy sitting posture, do not slouch or slump. This puts excessive strain on your back tissues, causing more damage and pain. Healthier options include:  Using more support, like a lumbar pillow.  Switching tasks to something that requires you to be upright or walking.  Talking a brief walk.  Lying down to rest in a neutral-spine position. PROLONGED STANDING WHILE SLIGHTLY LEANING FORWARD  When completing a task that requires you to lean forward while standing in one place for a long time, place either foot up on a stationary 2-4 inch high object to help maintain the best posture. When both feet are on the ground, the lower back tends to lose its slight inward curve. If this curve flattens (or becomes too large), then the back and your other joints will experience too much stress, tire more quickly, and can cause pain. CORRECT STANDING POSTURES Proper standing posture should be assumed with all daily activities, even if they only take a few moments, like when brushing your teeth. As in sitting, your ears should fall over your shoulders and your shoulders should fall over your hips.  You should keep a slight tension in your abdominal muscles to brace your spine. Your tailbone should point down to the ground, not behind your body, resulting in an over-extended swayback posture.  INCORRECT STANDING POSTURES  Common incorrect standing postures include a forward head, locked knees and/or an excessive swayback. WALKING Walk with an upright posture. Your ears, shoulders and hips should all line-up. PROLONGED ACTIVITY IN A FLEXED POSITION When completing a task that requires you to bend forward at your waist or lean over a low surface, try to find a way to stabilize 3 out of 4 of your limbs. You can place a hand or elbow on your thigh or rest a knee on the surface you are reaching across. This will provide you more stability so that your muscles do not fatigue as quickly. By keeping your knees relaxed, or slightly bent, you will also reduce stress across your lower back. CORRECT LIFTING TECHNIQUES DO :   Assume a wide stance. This will provide you more stability and the opportunity to get as close as possible to the object which you are lifting.  Tense your abdominals to brace your spine. Bend at the knees and hips. Keeping your back locked in a neutral-spine position, lift using your leg muscles. Lift with your legs, keeping your back straight.  Test the weight of unknown objects before attempting to lift them.  Try to keep your elbows locked down at your sides in order get the best strength from your shoulders when carrying an object.  Always ask for help when lifting heavy or awkward objects. INCORRECT LIFTING TECHNIQUES DO NOT:   Lock your knees when lifting, even if it is a small object.  Bend and twist. Pivot at your feet or move your feet when needing to change directions.  Assume that you  can safely pick up even a paper clip without proper posture. Document Released: 02/13/2005 Document Revised: 05/08/2011 Document Reviewed: 05/28/2008 [facility] Patient Information  2015 [place], [place]. This information is not intended to replace advice given to you by your health care provider. Make sure you discuss any questions you have with your health care provider.

## 2013-12-05 NOTE — Progress Notes (Signed)
Subjective:    Patient ID: Curtis Yang, male    DOB: 1961/01/12, 53 y.o.   MRN: [id number]  Reginia Forts, MD  Chief Complaint  Patient presents with  . Back Pain    had surgery back in August but is having lots of pain now; unable to sleep due to pain  . Flu Vaccine   Patient Active Problem List   Diagnosis Date Noted  . 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 Chest pain 06/21/2011  . Depression 03/15/2011  . Insomnia 03/15/2011  . Anxiety 03/15/2011  . Increased serum lipids 03/15/2011  . Low back pain 03/15/2011  . Erectile dysfunction 03/15/2011  . Increased glucose level 03/15/2011  . Low testosterone 03/15/2011  . History of colon polyps 03/15/2011  . Benign skin lesion of nose 08/13/2003   Prior to Admission medications   Medication Sig Start Date End Date Taking? Authorizing Provider  aspirin 81 MG tablet Take 81 mg by mouth as needed for pain.   Yes Historical Provider, MD  LORazepam (ATIVAN) 1 MG tablet TAKE 0.5- 1 TABLET BY MOUTH EVERY 8 HOURS AS NEEDED FOR ANXIETY   Yes Wardell Honour, MD  omeprazole (PRILOSEC) 20 MG capsule Take 1 capsule (20 mg total) by mouth daily. PATIENT NEEDS OFFICE VISIT FOR ADDITIONAL REFILLS   Yes Wardell Honour, MD  pravastatin (PRAVACHOL) 20 MG tablet Take 2 tablets (40 mg total) by mouth daily. PATIENT NEEDS OFFICE VISIT FOR ADDITIONAL REFILLS 08/06/13  Yes Wardell Honour, MD  testosterone cypionate (DEPOTESTOTERONE CYPIONATE) 200 MG/ML injection INJECT 1.5 MLS INTO THE MUSCLE EVERY 14 DAYS 08/06/13  Yes Wardell Honour, MD  traZODone (DESYREL) 50 MG tablet Take 1-2 tablets (50-100 mg total) by mouth at bedtime as needed for sleep. 08/06/13  Yes Wardell Honour, MD  triamcinolone cream (KENALOG) 0.1 % Apply topically 2 (two) times daily. 10/14/12  Yes Wardell Honour, MD  VANISHPOINT SAFETY SYRINGE 22G X 1-1/2" 3 ML MISC USE AS DIRECTED TO SELF-ADMINISTER TESTOSTERONE 01/05/13  Yes Wardell Honour, MD  cyclobenzaprine  (FLEXERIL) 5 MG tablet Take 1 tablet (5 mg total) by mouth 3 (three) times daily as needed for muscle spasms. 12/05/13   Araceli Bouche, PA  HYDROcodone-acetaminophen (NORCO) 5-325 MG per tablet Take 1 tablet by mouth every 6 (six) hours as needed. 12/05/13   Araceli Bouche, PA  naproxen (NAPROSYN) 500 MG tablet Take 1 tablet (500 mg total) by mouth 2 (two) times daily with a meal. 12/05/13   Araceli Bouche, PA     Back Pain    63 yom with PMH of herniated lumbar disc with radiculopathy presents today with low back pain. He originally injured himself in 2013 while doing some lifting for work. He eventually underwent laser surgery in 7/14 for herniated disc at L4/L5. He was better for awhile until he felt a sudden pain again in 01/2013 while lifting a box. He eventually underwent a transforaminal fusion surgery with two titanium rods placed in 03/2103 by Dr. Cranford Mon at [facility]. After surgery his right sided buttock, leg, and foot numbness resolved.   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 He has not been doing much lifting and has generally had a helper with him at work. Unfortunately this past week he has not had his helper and has been doing more and more at work. He is not doing any major lifting but has repeatedly lifted his 30# workbag throughout the work day.  He has been very sore in his left lower back for the past week with his increased activity at work. He has not been able to sleep at night due to the discomfort. He has tried taking ibuprofen and icing his back which has not helped resolve the pain. He has been doing his stretches which have not helped. He has also has a bit of numbness over the dorsum of his right foot over the past month. This is centered over the 1st and 2nd toes and is similar to numbness he had prior to the February surgery.   He denies any tingling or numbness down the right leg other than the numbness over dorsum of right foot.  No bowel/bladder  incontinence. No perianal numbness. His pain he is having currently is different from his previous symptoms with his herniated discs.    Review of Systems  Musculoskeletal: Positive for back pain.      No chest pain, SOB, HA, dizziness, vision change, N/V, diarrhea, constipation, dysuria, urinary urgency or frequency, myalgias, arthralgias or rash.  Objective:   Physical Exam  Constitutional: He is oriented to person, place, and time. Vital signs are normal. He appears well-developed and well-nourished.  Cardiovascular: Normal rate, regular rhythm and normal heart sounds.  Exam reveals no gallop.   No murmur heard. Pulmonary/Chest: Effort normal. He has no wheezes. He has no rhonchi. He has no rales.  Musculoskeletal:       Cervical back: He exhibits no tenderness and no bony tenderness.       Thoracic back: He exhibits no tenderness and no bony tenderness.       Lumbar back: He exhibits tenderness, bony tenderness and spasm.  TTP over lumbar/sacral spine. TTP across a wide 2-3" area over left lower back. Lumbar paraspinal muscles tense and in spasm on leftt side. Very mild point tenderness over right lower back. No edema, No erythema.   Neurological: He is alert and oriented to person, place, and time. He has normal reflexes.  Reflex Scores:      Patellar reflexes are 2+ on the right side and 2+ on the left side. 5/5 strength left hip, knee, ankle. 5/5 strength right hip, knee, ankle. Negative SLR bilaterally. Numbness over top of 1st and 2nd toes right foot.   Psychiatric: He has a normal mood and affect. His speech is normal and behavior is normal.      Assessment & Plan:   30 yom with PMH herniated disc with radiculopathy and prior procedures for this presents with left sided low back pain.   Low back strain, initial encounter - Plan: cyclobenzaprine (FLEXERIL) 5 MG tablet, naproxen (NAPROSYN) 500 MG tablet  Left-sided low back pain without sciatica - Plan:  HYDROcodone-acetaminophen ([place]) 5-325 MG per tablet  History of herniated intervertebral disc  Influenza vaccine needed - Plan: Flu Vaccine QUAD 36+ mos PF IM (Fluarix Quad PF)  Symptoms most consistent with left sided lumbar muscle strain. He had been fairly inactive since his procedure in 03/2013 and has recently started doing more and lifting more at work which is most likely the cause of his strain. His pain today presents differently than his symptoms with his previous herniated discs. No concern for cauda equina syndrome with PE. No radiculopathy with PE testing.   -- Norco for pain as needed.  -- Flexeril for muscle spasm. -- Naproxen for pain/inflammation as needed. -- Handout with exercises/stretches given to help strengthen and stretch lower back.  -- Encouraged to use knees  to lift to help preserve his low back.  -- Recommended alternating between heat and ice depending on what feels most comfortable for patient.  -- Patient encouraged to follow up with his [facility] surgeon if symptoms persist despite these treatments.   Julieta Gutting, PA-C Physician Assistant-Certified [facility]  12/05/2013 4:38 PM

## 2013-12-05 NOTE — Progress Notes (Signed)
I was directly involved with the patient's care and agree with the physical, diagnosis and treatment plan.  

## 2014-01-17 ENCOUNTER — Other Ambulatory Visit: Payer: Self-pay | Admitting: Family Medicine

## 2014-01-18 NOTE — Telephone Encounter (Signed)
Call pharmacy ---- I approved 6 total refills on 09/02/13.  Pt should have one more refill for 02/02/14.

## 2014-01-19 ENCOUNTER — Other Ambulatory Visit: Payer: Self-pay | Admitting: Family Medicine

## 2014-01-28 ENCOUNTER — Telehealth: Payer: Self-pay

## 2014-01-28 NOTE — Telephone Encounter (Signed)
Patient made an appointment on 02/25/2014 with Dr. Tamala Julian.  Will run out of Lorazepam before the appt date.   Can you please re-fill medication up to that date for patient?  [facility] on [address]  Pt # [phone]

## 2014-01-31 MED ORDER — LORAZEPAM 1 MG PO TABS: ORAL_TABLET | ORAL | Status: DC

## 2014-01-31 NOTE — Telephone Encounter (Signed)
Please call in or fax refill of Lorazepam as approved.

## 2014-01-31 NOTE — Telephone Encounter (Signed)
Pt notified that rx has been faxed to pharamcy

## 2014-02-09 ENCOUNTER — Ambulatory Visit: Payer: BC Managed Care – PPO | Admitting: Family Medicine

## 2014-02-25 ENCOUNTER — Ambulatory Visit (INDEPENDENT_AMBULATORY_CARE_PROVIDER_SITE_OTHER): Payer: 59 | Admitting: Family Medicine

## 2014-02-25 ENCOUNTER — Encounter: Payer: Self-pay | Admitting: Family Medicine

## 2014-02-25 ENCOUNTER — Other Ambulatory Visit: Payer: Self-pay | Admitting: Family Medicine

## 2014-02-25 VITALS — BP 140/86 | HR 83 | Temp 98.4°F | Resp 16 | Ht 72.5 in | Wt 196.2 lb

## 2014-02-25 DIAGNOSIS — K219 Gastro-esophageal reflux disease without esophagitis: Secondary | ICD-10-CM

## 2014-02-25 DIAGNOSIS — G4700 Insomnia, unspecified: Secondary | ICD-10-CM

## 2014-02-25 DIAGNOSIS — R7309 Other abnormal glucose: Secondary | ICD-10-CM

## 2014-02-25 DIAGNOSIS — F419 Anxiety disorder, unspecified: Secondary | ICD-10-CM

## 2014-02-25 DIAGNOSIS — S39012A Strain of muscle, fascia and tendon of lower back, initial encounter: Secondary | ICD-10-CM

## 2014-02-25 DIAGNOSIS — E7800 Pure hypercholesterolemia, unspecified: Secondary | ICD-10-CM

## 2014-02-25 DIAGNOSIS — Z860100 Personal history of colon polyps, unspecified: Secondary | ICD-10-CM

## 2014-02-25 DIAGNOSIS — E291 Testicular hypofunction: Secondary | ICD-10-CM

## 2014-02-25 DIAGNOSIS — R739 Hyperglycemia, unspecified: Secondary | ICD-10-CM

## 2014-02-25 DIAGNOSIS — IMO0002 Reserved for concepts with insufficient information to code with codable children: Secondary | ICD-10-CM

## 2014-02-25 DIAGNOSIS — L988 Other specified disorders of the skin and subcutaneous tissue: Secondary | ICD-10-CM

## 2014-02-25 LAB — COMPLETE METABOLIC PANEL WITH GFR
ALBUMIN: 4.6 g/dL (ref 3.5–5.2)
ALK PHOS: 57 U/L (ref 39–117)
ALT: 28 U/L (ref 0–53)
AST: 21 U/L (ref 0–37)
BILIRUBIN TOTAL: 0.9 mg/dL (ref 0.2–1.2)
BUN: 13 mg/dL (ref 6–23)
CO2: 27 mEq/L (ref 19–32)
Calcium: 9.8 mg/dL (ref 8.4–10.5)
Chloride: 103 mEq/L (ref 96–112)
Creat: 1.04 mg/dL (ref 0.50–1.35)
GFR, Est Non African American: 80 mL/min
Glucose, Bld: 97 mg/dL (ref 70–99)
Potassium: 4.3 mEq/L (ref 3.5–5.3)
Sodium: 139 mEq/L (ref 135–145)
TOTAL PROTEIN: 6.7 g/dL (ref 6.0–8.3)

## 2014-02-25 LAB — CBC WITH DIFFERENTIAL/PLATELET
BASOS ABS: 0 10*3/uL (ref 0.0–0.1)
Basophils Relative: 0 % (ref 0–1)
EOS ABS: 0.1 10*3/uL (ref 0.0–0.7)
Eosinophils Relative: 2 % (ref 0–5)
HEMATOCRIT: 42.5 % (ref 39.0–52.0)
Hemoglobin: 14.8 g/dL (ref 13.0–17.0)
LYMPHS ABS: 1.6 10*3/uL (ref 0.7–4.0)
LYMPHS PCT: 33 % (ref 12–46)
MCH: 30.8 pg (ref 26.0–34.0)
MCHC: 34.8 g/dL (ref 30.0–36.0)
MCV: 88.4 fL (ref 78.0–100.0)
MONO ABS: 0.4 10*3/uL (ref 0.1–1.0)
MPV: 10.1 fL (ref 8.6–12.4)
Monocytes Relative: 9 % (ref 3–12)
NEUTROS PCT: 56 % (ref 43–77)
Neutro Abs: 2.7 10*3/uL (ref 1.7–7.7)
PLATELETS: 202 10*3/uL (ref 150–400)
RBC: 4.81 MIL/uL (ref 4.22–5.81)
RDW: 13.1 % (ref 11.5–15.5)
WBC: 4.9 10*3/uL (ref 4.0–10.5)

## 2014-02-25 LAB — LIPID PANEL
Cholesterol: 150 mg/dL (ref 0–200)
HDL: 62 mg/dL (ref >39)
LDL Cholesterol: 75 mg/dL (ref 0–99)
TRIGLYCERIDES: 64 mg/dL (ref <150)
Total CHOL/HDL Ratio: 2.4 Ratio
VLDL: 13 mg/dL (ref 0–40)

## 2014-02-25 LAB — HEMOGLOBIN A1C
HEMOGLOBIN A1C: 5.7 % — AB (ref <5.7)
MEAN PLASMA GLUCOSE: 117 mg/dL — AB (ref <117)

## 2014-02-25 MED ORDER — OMEPRAZOLE 20 MG PO CPDR: 20.0000 mg | DELAYED_RELEASE_CAPSULE | Freq: Every day | ORAL | Status: DC

## 2014-02-25 MED ORDER — TESTOSTERONE CYPIONATE 200 MG/ML IM SOLN: 200.0000 mg | INTRAMUSCULAR | Status: DC

## 2014-02-25 MED ORDER — CYCLOBENZAPRINE HCL 5 MG PO TABS: 5.0000 mg | ORAL_TABLET | Freq: Three times a day (TID) | ORAL | Status: DC | PRN

## 2014-02-25 MED ORDER — TRAZODONE HCL 50 MG PO TABS: 50.0000 mg | ORAL_TABLET | Freq: Every evening | ORAL | Status: DC | PRN

## 2014-02-25 MED ORDER — LORAZEPAM 1 MG PO TABS: ORAL_TABLET | ORAL | Status: DC

## 2014-02-25 MED ORDER — NAPROXEN 500 MG PO TABS
500.0000 mg | ORAL_TABLET | Freq: Two times a day (BID) | ORAL | Status: DC
Start: 2014-02-25 — End: 2015-05-21

## 2014-02-25 MED ORDER — PRAVASTATIN SODIUM 20 MG PO TABS: 40.0000 mg | ORAL_TABLET | Freq: Every day | ORAL | Status: DC

## 2014-02-25 NOTE — Progress Notes (Signed)
Subjective:    Patient ID: Curtis Yang, male    DOB: [birth year], 53 y.o.   MRN: [id number]  02/25/2014  Follow-up; Hyperlipidemia; and Anxiety   HPI This 53 y.o. male presents for six month follow-up:  1. Hypercholesterolemia:  Patient reports good compliance with medication, good tolerance to medication, and good symptom control.    Two cups of coffee this morning.  Weight down; has been working on it.    2. Hyperglycemia: at CPE with glucose of 107.  Has a sweet tooth.  No sodas; drinks water.  Small eater during the day.  3.  Anxiety:  Emotionally stable; taking Ativan tid; has taken four a day on rare occasion.  Overall doing well; needs refill on medication.  4.  Hypogonadism:  Decreased testosterone to 60ml every two weeks at last visit.   Feeling well.  No ED; has decreased from 84ml to 46ml over past few years.  No problems with sex drive.  Energy level is good overall.    5. Lower back pain: suffers with intermittent tightness on L side of lower back; working 16 hour days; lots of driving.  Travels throughout the state; goes to [place] next week.  No radicular pain.  +numbness along R foot intermittently.  No weakness.  No b/b dysfunction.  No saddle paresthesias.  6. L second finger lesion: onset one month ago; moves around. L second finger.  At IP joint. No decreased ROM or n/t.  7. Colon polyp: s/p colonoscopy 10/03/13 precancerous; repeat in 5 years.   Review of Systems  Constitutional: Negative for fever, chills, diaphoresis, activity change, appetite change, fatigue and unexpected weight change.  Respiratory: Negative for cough and shortness of breath.   Cardiovascular: Negative for chest pain, palpitations and leg swelling.  Gastrointestinal: Negative for nausea, vomiting, abdominal pain and diarrhea.  Endocrine: Negative for cold intolerance, heat intolerance, polydipsia, polyphagia and polyuria.  Musculoskeletal: Positive for back pain and joint swelling.    Skin: Negative for color change, rash and wound.  Neurological: Negative for dizziness, tremors, seizures, syncope, facial asymmetry, speech difficulty, weakness, light-headedness, numbness and headaches.  Psychiatric/Behavioral: Negative for sleep disturbance and dysphoric mood. The patient is not nervous/anxious.     Past Medical History  Diagnosis Date  . Hypogonadism male   . Hyperlipidemia   . Depression   . Insomnia   . Benign skin lesion of nose   . Increased serum lipids   . Low back pain   . Erectile dysfunction     Due to decreased testosterone   . Increased glucose level   . Low testosterone   . History of colon polyps   . Anxiety   . Arthritis     DDD lumbar.  . Previous back surgery   . GERD (gastroesophageal reflux disease)    Past Surgical History  Procedure Laterality Date  . Eye surgery  02/28/2000    Lasik  . Colonoscopy w/ polypectomy  09/2013    One polyp, came back pre-cancerous. Repeat 5 years unless symptoms present.   [name] Spine surgery  09/26/2012    [facility]-L5 [facility] [place].  . Transforaminal lumbar interbody fusion (tlif) with pedicle screw fixation 1 level Right 04/22/2013    Procedure: TRANSFORAMINAL LUMBAR INTERBODY FUSION (TLIF) WITH PEDICLE SCREW FIXATION LUMBAR FIVE SACRAL ONE;  Surgeon: Faythe Ghee, MD;  Location: [facility];  Service: Neurosurgery;  Laterality: Right;   No Known Allergies Current Outpatient Prescriptions  Medication Sig Dispense Refill  .  aspirin 81 MG tablet Take 81 mg by mouth as needed for pain.    . cyclobenzaprine (FLEXERIL) 5 MG tablet Take 1 tablet (5 mg total) by mouth 3 (three) times daily as needed for muscle spasms. 60 tablet 5  . LORazepam (ATIVAN) 1 MG tablet TAKE 0.5- 1 TABLET BY MOUTH EVERY 8 HOURS AS NEEDED FOR ANXIETY 90 tablet 3  . naproxen (NAPROSYN) 500 MG tablet Take 1 tablet (500 mg total) by mouth 2 (two) times daily with a meal. 60 tablet 3  . omeprazole (PRILOSEC) 20 MG capsule Take 1  capsule (20 mg total) by mouth daily. 30 capsule 11  . pravastatin (PRAVACHOL) 20 MG tablet Take 2 tablets (40 mg total) by mouth daily. 60 tablet 11  . testosterone cypionate (DEPOTESTOTERONE CYPIONATE) 200 MG/ML injection Inject 1 mL (200 mg total) into the muscle every 14 (fourteen) days. 2 mL 5  . traZODone (DESYREL) 50 MG tablet Take 1-2 tablets (50-100 mg total) by mouth at bedtime as needed for sleep. 60 tablet 11  . triamcinolone cream (KENALOG) 0.1 % Apply topically 2 (two) times daily. 45 g 1  . VANISHPOINT SAFETY SYRINGE 22G X 1-1/2" 3 ML MISC USE AS DIRECTED TO SELF-ADMINISTER TESTOSTERONE 50 each 0  . HYDROcodone-acetaminophen (NORCO) 5-325 MG per tablet Take 1 tablet by mouth every 6 (six) hours as needed. (Patient not taking: Reported on 02/25/2014) 20 tablet 0   Current Facility-Administered Medications  Medication Dose Route Frequency Provider Last Rate Last Dose  . testosterone cypionate (DEPOTESTOTERONE CYPIONATE) injection 400 mg  400 mg Intramuscular Y70 Days Beatriz Chancellor, PA-C   400 mg at 10/01/11 6237       Objective:    BP 140/86 mmHg  Pulse 83  Temp(Src) 98.4 F (36.9 C) (Oral)  Resp 16  Ht 6' 0.5" (1.842 m)  Wt 196 lb 3.2 oz (88.996 kg)  BMI 26.23 kg/m2  SpO2 98% Physical Exam  Constitutional: He is oriented to person, place, and time. He appears well-developed and well-nourished. No distress.  HENT:  Head: Normocephalic and atraumatic.  Right Ear: External ear normal.  Left Ear: External ear normal.  Nose: Nose normal.  Mouth/Throat: Oropharynx is clear and moist.  Eyes: Conjunctivae and EOM are normal. Pupils are equal, round, and reactive to light.  Neck: Normal range of motion. Neck supple. Carotid bruit is not present. No thyromegaly present.  Cardiovascular: Normal rate, regular rhythm, normal heart sounds and intact distal pulses.  Exam reveals no gallop and no friction rub.   No murmur heard. Pulmonary/Chest: Effort normal and breath sounds  normal. He has no wheezes. He has no rales.  Abdominal: Soft. Bowel sounds are normal. He exhibits no distension and no mass. There is no tenderness. There is no rebound and no guarding.  Musculoskeletal:  L second finger with mobile 68mm diameter cystic lesion at IP; full extension and flexion of finger.  No erythema; non-tender.  Lymphadenopathy:    He has no cervical adenopathy.  Neurological: He is alert and oriented to person, place, and time. No cranial nerve deficit.  Skin: Skin is warm and dry. No rash noted. He is not diaphoretic.  Psychiatric: He has a normal mood and affect. His behavior is normal.  Nursing note and vitals reviewed.       Assessment & Plan:   1. Elevated cholesterol   2. Hyperglycemia   3. Hypogonadism male   4. Low back strain, initial encounter   5. Anxiety   6. Increased  glucose level   7. History of colon polyps   8. Insomnia   9. Gastroesophageal reflux disease without esophagitis   10. Cyst of finger      1. Hypercholesterolemia: controlled; obtain labs; refill provided.  2.  Hyperglycemia: persistent; recent weight loss intentional; obtain glucose and HgbA1c. Continue with weight loss, exercise, dietary modification. 3.  Hypogonadism: controlled with 63ml of Testosterone supplementation every two weeks; obtain labs; refill provided. 4. Low back strain: improved from 11/2013; refill of Flexeril provided; refill of Naproxen also provided for PRN use.  Continue regular walking and daily stretches. 5.  Anxiety: controlled; no change in management; refill of Ativan provided for tid use.   6.  History of colon polyps: s/p repeat colonoscopy since last visit; single polyp; repeat colonoscopy in 5 years recommended. 7. GERD: stable; refill of Prilosec provided. 8.  Insomnia: stable; refill of Trazodone provided. 9. Cyst finger L second digit:  New.  Minimally symptomatic at this time; RTC for decreased range of motion or numbness/tingling of digit or pain  or swelling.    Meds ordered this encounter  Medications  . cyclobenzaprine (FLEXERIL) 5 MG tablet    Sig: Take 1 tablet (5 mg total) by mouth 3 (three) times daily as needed for muscle spasms.    Dispense:  60 tablet    Refill:  5  . naproxen (NAPROSYN) 500 MG tablet    Sig: Take 1 tablet (500 mg total) by mouth 2 (two) times daily with a meal.    Dispense:  60 tablet    Refill:  3  . omeprazole (PRILOSEC) 20 MG capsule    Sig: Take 1 capsule (20 mg total) by mouth daily.    Dispense:  30 capsule    Refill:  11  . pravastatin (PRAVACHOL) 20 MG tablet    Sig: Take 2 tablets (40 mg total) by mouth daily.    Dispense:  60 tablet    Refill:  11  . traZODone (DESYREL) 50 MG tablet    Sig: Take 1-2 tablets (50-100 mg total) by mouth at bedtime as needed for sleep.    Dispense:  60 tablet    Refill:  11  . LORazepam (ATIVAN) 1 MG tablet    Sig: TAKE 0.5- 1 TABLET BY MOUTH EVERY 8 HOURS AS NEEDED FOR ANXIETY    Dispense:  90 tablet    Refill:  3  . DISCONTD: testosterone cypionate (DEPOTESTOTERONE CYPIONATE) 200 MG/ML injection    Sig: Inject 1 mL (200 mg total) into the muscle every 14 (fourteen) days.    Dispense:  10 mL    Refill:  5  . testosterone cypionate (DEPOTESTOTERONE CYPIONATE) 200 MG/ML injection    Sig: Inject 1 mL (200 mg total) into the muscle every 14 (fourteen) days.    Dispense:  2 mL    Refill:  5    Dispense: QS one month (2   74ml vials)    Return in about 6 months (around 08/27/2014) for complete physical examiniation.    Avyn Coate Elayne Guerin, M.D. [facility] [address] [place], [place]  [address] [phone] phone [phone] fax

## 2014-02-25 NOTE — Addendum Note (Signed)
Addended by: Wardell Honour on: 02/25/2014 11:00 AM   Modules accepted: Orders, Medications

## 2014-02-26 LAB — TESTOSTERONE, FREE, TOTAL, SHBG
Sex Hormone Binding: 35 nmol/L (ref 13–71)
Testosterone, Free: 43 pg/mL — ABNORMAL LOW (ref 47.0–244.0)
Testosterone-% Free: 1.9 % (ref 1.6–2.9)
Testosterone: 232 ng/dL — ABNORMAL LOW (ref 300–890)

## 2014-04-17 ENCOUNTER — Ambulatory Visit (INDEPENDENT_AMBULATORY_CARE_PROVIDER_SITE_OTHER): Payer: 59 | Admitting: Family Medicine

## 2014-04-17 VITALS — BP 126/82 | HR 110 | Temp 97.5°F | Resp 18 | Ht 66.0 in | Wt 206.6 lb

## 2014-04-17 DIAGNOSIS — Z8739 Personal history of other diseases of the musculoskeletal system and connective tissue: Secondary | ICD-10-CM

## 2014-04-17 DIAGNOSIS — M79605 Pain in left leg: Secondary | ICD-10-CM

## 2014-04-17 DIAGNOSIS — M545 Low back pain, unspecified: Secondary | ICD-10-CM

## 2014-04-17 MED ORDER — HYDROCODONE-ACETAMINOPHEN 5-325 MG PO TABS: 1.0000 | ORAL_TABLET | Freq: Four times a day (QID) | ORAL | Status: DC | PRN

## 2014-04-17 MED ORDER — PREDNISONE 20 MG PO TABS: ORAL_TABLET | ORAL | Status: DC

## 2014-04-17 NOTE — Patient Instructions (Signed)
Try the prednisone as discussed, hydrocodone if needed (be careful combining this with flexeril or other sedating medicines). If not improving into next week - return for recheck and possible xray or other imaging. Return to the clinic or go to the nearest emergency room if any of your symptoms worsen or new symptoms occur.  Back Pain, Adult Low back pain is very common. About 1 in 5 people have back pain.The cause of low back pain is rarely dangerous. The pain often gets better over time.About half of people with a sudden onset of back pain feel better in just 2 weeks. About 8 in 10 people feel better by 6 weeks.  CAUSES Some common causes of back pain include:  Strain of the muscles or ligaments supporting the spine.  Wear and tear (degeneration) of the spinal discs.  Arthritis.  Direct injury to the back. DIAGNOSIS Most of the time, the direct cause of low back pain is not known.However, back pain can be treated effectively even when the exact cause of the pain is unknown.Answering your caregiver's questions about your overall health and symptoms is one of the most accurate ways to make sure the cause of your pain is not dangerous. If your caregiver needs more information, he or she may order lab work or imaging tests (X-rays or MRIs).However, even if imaging tests show changes in your back, this usually does not require surgery. HOME CARE INSTRUCTIONS For many people, back pain returns.Since low back pain is rarely dangerous, it is often a condition that people can learn to [facility] their own.   Remain active. It is stressful on the back to sit or stand in one place. Do not sit, drive, or stand in one place for more than 30 minutes at a time. Take short walks on level surfaces as soon as pain allows.Try to increase the length of time you walk each day.  Do not stay in bed.Resting more than 1 or 2 days can delay your recovery.  Do not avoid exercise or work.Your body is made to  move.It is not dangerous to be active, even though your back may hurt.Your back will likely heal faster if you return to being active before your pain is gone.  Pay attention to your body when you bend and lift. Many people have less discomfortwhen lifting if they bend their knees, keep the load close to their bodies,and avoid twisting. Often, the most comfortable positions are those that put less stress on your recovering back.  Find a comfortable position to sleep. Use a firm mattress and lie on your side with your knees slightly bent. If you lie on your back, put a pillow under your knees.  Only take over-the-counter or prescription medicines as directed by your caregiver. Over-the-counter medicines to reduce pain and inflammation are often the most helpful.Your caregiver may prescribe muscle relaxant drugs.These medicines help dull your pain so you can more quickly return to your normal activities and healthy exercise.  Put ice on the injured area.  Put ice in a plastic bag.  Place a towel between your skin and the bag.  Leave the ice on for 15-20 minutes, 03-04 times a day for the first 2 to 3 days. After that, ice and heat may be alternated to reduce pain and spasms.  Ask your caregiver about trying back exercises and gentle massage. This may be of some benefit.  Avoid feeling anxious or stressed.Stress increases muscle tension and can worsen back pain.It is important to recognize  when you are anxious or stressed and learn ways to manage it.Exercise is a great option. SEEK MEDICAL CARE IF:  You have pain that is not relieved with rest or medicine.  You have pain that does not improve in 1 week.  You have new symptoms.  You are generally not feeling well. SEEK IMMEDIATE MEDICAL CARE IF:   You have pain that radiates from your back into your legs.  You develop new bowel or bladder control problems.  You have unusual weakness or numbness in your arms or legs.  You  develop nausea or vomiting.  You develop abdominal pain.  You feel faint. Document Released: 02/13/2005 Document Revised: 08/15/2011 Document Reviewed: 06/17/2013 [facility] Patient Information 2015 [place], [place]. This information is not intended to replace advice given to you by your health care provider. Make sure you discuss any questions you have with your health care provider.

## 2014-04-17 NOTE — Progress Notes (Signed)
Subjective:    Patient ID: Curtis Yang, male    DOB: January 23, 1961, 54 y.o.   MRN: [id number]  This chart was scribed for Merri Ray, MD by Stephania Fragmin, ED Scribe. This patient was seen in room 10 and the patient's care was started at 10:52 AM.   Chief Complaint  Patient presents with  . Back Pain    lower back pain mostly on left side. x weeks    HPI  HPI Comments: Curtis Yang is a 54 y.o. male who presents to the [facility] complaining of lower back pain, left greater than right, with onset several weeks ago.   Chart Review: here for left low back pain. Per problem list, history of lumbar disc herniation with radiculopathy. In chart review, had MRI of lumbar spine in April 2014. L5-S1 disc herniation, compressing the L5 and L4 nerve roots, left greater than right; as well as moderate canal stenosis. Had a L5-S1 fixation, but after Dr. Hal Neer in February 2015. Seen here in October for back pain; treated with Fexeril, Norco, and Naproxen, along with home exercise program. His back pain was also discussed at 02/25/14 visit with Dr. Tamala Julian. Overall had improved. Continued on PRN naproxen and flexeril, HEP. Last glucose normal at 97 in December 2015.  Patient reports his back flared up the past 2 weeks, with worsening this week. He states he has been doing more driving the past couple of weeks and hunching and crouching in tight spaces at work, working on a new [employer], which has aggravated his back. He states his pain was especially noticeable when he had been climbing up a vertical ladder, which exacerbated his pain. He denies any specific known instance of trauma or injury. Patient reports he is still doing the HEP. He has been taking 1 tablet of Naproxen once every 12 hours and Flexeril 500 mg twice a day occasionally and Motrin with minimal relief. He denies any stomach upset or melena from Flexeril, noting that he tries to avoid taking it too much because "it  doesn't seem too good for you." He denies bladder or bowel incontinence or perianal numbness.   Patient also reports some associated throbbing left leg pain while driving in the past couple weeks. He states it doesn't hurt him too much, but he has been noticing it.   He states he has used hydrocodone before. He has also taken Prednisone, and reports hunger as side effect; no irritability or any other psychiatric side effects.    Review of Systems  Genitourinary:       No bowel or bladder incontinence  Musculoskeletal: Positive for back pain.  Skin: Negative for rash.  Neurological: Negative for numbness.       Objective:   Physical Exam  Constitutional: He is oriented to person, place, and time. He appears well-developed and well-nourished. No distress.  HENT:  Head: Normocephalic and atraumatic.  Eyes: Conjunctivae and EOM are normal.  Neck: Neck supple. No tracheal deviation present.  Cardiovascular: Normal rate.   Pulmonary/Chest: Effort normal. No respiratory distress.  Musculoskeletal: Normal range of motion. He exhibits tenderness.  2 well healed scars on lower lumbar region. Tenderness along the lumbar spine approximately L4-L5; no midline bony tenderness. Sciatic and SI joint non-tender. Back ROM, flexion at 90 degrees. Intact extension. Slightly decreased right vs left lateral flexion. Pain with right rotation but intact. Able to heel-to-toe walk without difficulty.   Reflexes 2+ on left patella, 1+ on right patella.  1+ Achilles bilaterally. Negative Babinski. Negative straight leg raise.  Neurological: He is alert and oriented to person, place, and time.  Skin: Skin is warm and dry.  Psychiatric: He has a normal mood and affect. His behavior is normal.  Nursing note and vitals reviewed.   Filed Vitals:   04/17/14 0913  BP: 126/82  Pulse: 110  Temp: 97.5 F (36.4 C)  TempSrc: Oral  Resp: 18  Height: 5\' 6"  (1.676 m)  Weight: 206 lb 9.6 oz (93.713 kg)  SpO2: 97%         Assessment & Plan:   Curtis Yang is a 54 y.o. 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 No concerning findings on exam, and NKI - deferred imaging today. Trial of prednisone (stop Naprosyn), flexeril if needed, rom/HEP as tolerated and if not improving into next week - rtc for recheck and imaging likely then. rtc precautions.   Meds ordered this encounter  Medications  . predniSONE (DELTASONE) 20 MG tablet    Sig: 3 by mouth for 3 days, then 2 by mouth for 2 days, then 1 by mouth for 2 days, then 1/2 by mouth for 2 days.    Dispense:  16 tablet    Refill:  0  . HYDROcodone-acetaminophen (NORCO/VICODIN) 5-325 MG per tablet    Sig: Take 1 tablet by mouth every 6 (six) hours as needed for moderate pain.    Dispense:  15 tablet    Refill:  0   Patient Instructions  Try the prednisone as discussed, hydrocodone if needed (be careful combining this with flexeril or other sedating medicines). If not improving into next week - return for recheck and possible xray or other imaging. Return to the clinic or go to the nearest emergency room if any of your symptoms worsen or new symptoms occur.  Back Pain, Adult Low back pain is very common. About 1 in 5 people have back pain.The cause of low back pain is rarely dangerous. The pain often gets better over time.About half of people with a sudden onset of back pain feel better in just 2 weeks. About 8 in 10 people feel better by 6 weeks.  CAUSES Some common causes of back pain include:  Strain of the muscles or ligaments supporting the spine.  Wear and tear (degeneration) of the spinal discs.  Arthritis.  Direct injury to the  back. DIAGNOSIS Most of the time, the direct cause of low back pain is not known.However, back pain can be treated effectively even when the exact cause of the pain is unknown.Answering your caregiver's questions about your overall health and symptoms is one of the most accurate ways to make sure the cause of your pain is not dangerous. If your caregiver needs more information, he or she may order lab work or imaging tests (X-rays or MRIs).However, even if imaging tests show changes in your back, this usually does not require surgery. HOME CARE INSTRUCTIONS For many people, back pain returns.Since low back pain is rarely dangerous, it is often a condition that people can learn to [facility] their own.   Remain active. It is stressful on the back to sit or stand in one place. Do not sit, drive, or stand in one place  for more than 30 minutes at a time. Take short walks on level surfaces as soon as pain allows.Try to increase the length of time you walk each day.  Do not stay in bed.Resting more than 1 or 2 days can delay your recovery.  Do not avoid exercise or work.Your body is made to move.It is not dangerous to be active, even though your back may hurt.Your back will likely heal faster if you return to being active before your pain is gone.  Pay attention to your body when you bend and lift. Many people have less discomfortwhen lifting if they bend their knees, keep the load close to their bodies,and avoid twisting. Often, the most comfortable positions are those that put less stress on your recovering back.  Find a comfortable position to sleep. Use a firm mattress and lie on your side with your knees slightly bent. If you lie on your back, put a pillow under your knees.  Only take over-the-counter or prescription medicines as directed by your caregiver. Over-the-counter medicines to reduce pain and inflammation are often the most helpful.Your caregiver may prescribe muscle relaxant  drugs.These medicines help dull your pain so you can more quickly return to your normal activities and healthy exercise.  Put ice on the injured area.  Put ice in a plastic bag.  Place a towel between your skin and the bag.  Leave the ice on for 15-20 minutes, 03-04 times a day for the first 2 to 3 days. After that, ice and heat may be alternated to reduce pain and spasms.  Ask your caregiver about trying back exercises and gentle massage. This may be of some benefit.  Avoid feeling anxious or stressed.Stress increases muscle tension and can worsen back pain.It is important to recognize when you are anxious or stressed and learn ways to manage it.Exercise is a great option. SEEK MEDICAL CARE IF:  You have pain that is not relieved with rest or medicine.  You have pain that does not improve in 1 week.  You have new symptoms.  You are generally not feeling well. SEEK IMMEDIATE MEDICAL CARE IF:   You have pain that radiates from your back into your legs.  You develop new bowel or bladder control problems.  You have unusual weakness or numbness in your arms or legs.  You develop nausea or vomiting.  You develop abdominal pain.  You feel faint. Document Released: 02/13/2005 Document Revised: 08/15/2011 Document Reviewed: 06/17/2013 [facility] Patient Information 2015 [place], [place]. This information is not intended to replace advice given to you by your health care provider. Make sure you discuss any questions you have with your health care provider.     I personally performed the services described in this documentation, which was scribed in my presence. The recorded information has been reviewed and considered, and addended by me as needed.

## 2014-06-08 ENCOUNTER — Other Ambulatory Visit: Payer: Self-pay | Admitting: Family Medicine

## 2014-06-08 NOTE — Telephone Encounter (Signed)
Please fax or call in refill of Ativan as approved.

## 2014-06-09 ENCOUNTER — Other Ambulatory Visit: Payer: Self-pay | Admitting: Family Medicine

## 2014-06-09 NOTE — Telephone Encounter (Signed)
Faxed

## 2014-07-02 ENCOUNTER — Telehealth: Payer: Self-pay

## 2014-07-02 MED ORDER — TESTOSTERONE CYPIONATE 200 MG/ML IM SOLN: 300.0000 mg | INTRAMUSCULAR | Status: DC

## 2014-07-02 NOTE — Telephone Encounter (Signed)
Since testosterone dosage was decreased pt does not have any energy and feels really bad

## 2014-07-02 NOTE — Telephone Encounter (Signed)
Pt.notified

## 2014-07-02 NOTE — Telephone Encounter (Signed)
Can we increase dose?

## 2014-07-02 NOTE — Telephone Encounter (Signed)
OK for patient to increase Testosterone to 1.68ml every two weeks.

## 2014-08-19 ENCOUNTER — Ambulatory Visit (INDEPENDENT_AMBULATORY_CARE_PROVIDER_SITE_OTHER): Payer: 59 | Admitting: Family Medicine

## 2014-08-19 ENCOUNTER — Encounter: Payer: Self-pay | Admitting: Family Medicine

## 2014-08-19 VITALS — BP 125/86 | HR 101 | Temp 98.6°F | Resp 16 | Ht 72.5 in | Wt 215.8 lb

## 2014-08-19 DIAGNOSIS — E291 Testicular hypofunction: Secondary | ICD-10-CM | POA: Diagnosis not present

## 2014-08-19 DIAGNOSIS — H9190 Unspecified hearing loss, unspecified ear: Secondary | ICD-10-CM | POA: Diagnosis not present

## 2014-08-19 DIAGNOSIS — Z125 Encounter for screening for malignant neoplasm of prostate: Secondary | ICD-10-CM | POA: Diagnosis not present

## 2014-08-19 DIAGNOSIS — R7302 Impaired glucose tolerance (oral): Secondary | ICD-10-CM | POA: Diagnosis not present

## 2014-08-19 DIAGNOSIS — F32A Depression, unspecified: Secondary | ICD-10-CM

## 2014-08-19 DIAGNOSIS — F419 Anxiety disorder, unspecified: Secondary | ICD-10-CM

## 2014-08-19 DIAGNOSIS — E7800 Pure hypercholesterolemia, unspecified: Secondary | ICD-10-CM

## 2014-08-19 DIAGNOSIS — Z0000 Encounter for general adult medical examination without abnormal findings: Secondary | ICD-10-CM

## 2014-08-19 DIAGNOSIS — F418 Other specified anxiety disorders: Secondary | ICD-10-CM | POA: Diagnosis not present

## 2014-08-19 DIAGNOSIS — E349 Endocrine disorder, unspecified: Secondary | ICD-10-CM

## 2014-08-19 DIAGNOSIS — F329 Major depressive disorder, single episode, unspecified: Secondary | ICD-10-CM

## 2014-08-19 LAB — COMPREHENSIVE METABOLIC PANEL
ALT: 29 U/L (ref 0–53)
AST: 23 U/L (ref 0–37)
Albumin: 4.8 g/dL (ref 3.5–5.2)
Alkaline Phosphatase: 52 U/L (ref 39–117)
BUN: 19 mg/dL (ref 6–23)
CO2: 28 mEq/L (ref 19–32)
CREATININE: 1.17 mg/dL (ref 0.50–1.35)
Calcium: 10.2 mg/dL (ref 8.4–10.5)
Chloride: 101 mEq/L (ref 96–112)
Glucose, Bld: 98 mg/dL (ref 70–99)
Potassium: 4 mEq/L (ref 3.5–5.3)
Sodium: 139 mEq/L (ref 135–145)
Total Bilirubin: 0.9 mg/dL (ref 0.2–1.2)
Total Protein: 7.2 g/dL (ref 6.0–8.3)

## 2014-08-19 LAB — CBC WITH DIFFERENTIAL/PLATELET
BASOS ABS: 0.1 10*3/uL (ref 0.0–0.1)
Basophils Relative: 1 % (ref 0–1)
Eosinophils Absolute: 0.1 10*3/uL (ref 0.0–0.7)
Eosinophils Relative: 2 % (ref 0–5)
HCT: 46.1 % (ref 39.0–52.0)
Hemoglobin: 16.2 g/dL (ref 13.0–17.0)
LYMPHS PCT: 25 % (ref 12–46)
Lymphs Abs: 1.6 10*3/uL (ref 0.7–4.0)
MCH: 31.3 pg (ref 26.0–34.0)
MCHC: 35.1 g/dL (ref 30.0–36.0)
MCV: 89 fL (ref 78.0–100.0)
MPV: 10.2 fL (ref 8.6–12.4)
Monocytes Absolute: 0.6 10*3/uL (ref 0.1–1.0)
Monocytes Relative: 10 % (ref 3–12)
NEUTROS ABS: 4 10*3/uL (ref 1.7–7.7)
NEUTROS PCT: 62 % (ref 43–77)
PLATELETS: 222 10*3/uL (ref 150–400)
RBC: 5.18 MIL/uL (ref 4.22–5.81)
RDW: 13.4 % (ref 11.5–15.5)
WBC: 6.4 10*3/uL (ref 4.0–10.5)

## 2014-08-19 LAB — POCT UA - MICROSCOPIC ONLY
CASTS, UR, LPF, POC: NEGATIVE
CRYSTALS, UR, HPF, POC: NEGATIVE
EPITHELIAL CELLS, URINE PER MICROSCOPY: NEGATIVE
Mucus, UA: POSITIVE
WBC, Ur, HPF, POC: NEGATIVE
YEAST UA: NEGATIVE

## 2014-08-19 LAB — TSH: TSH: 2.623 u[IU]/mL (ref 0.350–4.500)

## 2014-08-19 LAB — POCT URINALYSIS DIPSTICK
Bilirubin, UA: NEGATIVE
Glucose, UA: NEGATIVE
Ketones, UA: NEGATIVE
Leukocytes, UA: NEGATIVE
NITRITE UA: NEGATIVE
PROTEIN UA: NEGATIVE
RBC UA: NEGATIVE
Spec Grav, UA: 1.015
UROBILINOGEN UA: 0.2
pH, UA: 6.5

## 2014-08-19 LAB — LIPID PANEL
Cholesterol: 172 mg/dL (ref 0–200)
HDL: 67 mg/dL (ref >=40)
LDL CALC: 90 mg/dL (ref 0–99)
TRIGLYCERIDES: 76 mg/dL (ref <150)
Total CHOL/HDL Ratio: 2.6 Ratio
VLDL: 15 mg/dL (ref 0–40)

## 2014-08-19 LAB — VITAMIN B12: VITAMIN B 12: 684 pg/mL (ref 211–911)

## 2014-08-19 MED ORDER — TESTOSTERONE CYPIONATE 200 MG/ML IM SOLN: 300.0000 mg | INTRAMUSCULAR | Status: DC

## 2014-08-19 MED ORDER — LORAZEPAM 1 MG PO TABS: 1.0000 mg | ORAL_TABLET | Freq: Three times a day (TID) | ORAL | Status: DC | PRN

## 2014-08-19 NOTE — Patient Instructions (Signed)

## 2014-08-19 NOTE — Progress Notes (Signed)
Subjective:    Patient ID: Curtis Yang, male    DOB: [birth year], 54 y.o.   MRN: [id number]  HPI This 54 y.o. male presents for Complete Physical Examination.  Last physical:  07-2014 Colonoscopy:  04-07-2014; single polyp; repeat 5 years. TDAP:  2013 Pneumovax:  never Zostavax: never Influenza:  12-05-2013 Eye exam:  2015; scheduling; no glaucoma or cataracts. Dental exam:  Every six months.    DDD lumbar: flared in past six months; Flexeril is PRN; hydrocodone PRN.  Taking Naproxen and 2 Flexeril with flare.    Anxiety: 1tablets tid.  Sometimes needs 1/2 extra.  Taking Trazodone 1-2 at bedtime; may need Melatonin.    Review of Systems  Constitutional: Negative.  Negative for fever, chills, diaphoresis, activity change, appetite change, fatigue and unexpected weight change.  HENT: Positive for hearing loss and sinus pressure. Negative for congestion, dental problem, drooling, ear discharge, ear pain, facial swelling, mouth sores, nosebleeds, postnasal drip, rhinorrhea, sneezing, sore throat, tinnitus, trouble swallowing and voice change.   Eyes: Negative.  Negative for photophobia, pain, discharge, redness, itching and visual disturbance.  Respiratory: Negative.  Negative for apnea, cough, choking, chest tightness, shortness of breath, wheezing and stridor.   Cardiovascular: Negative.  Negative for chest pain, palpitations and leg swelling.  Gastrointestinal: Negative.  Negative for nausea, vomiting, abdominal pain, diarrhea, constipation and blood in stool.  Endocrine: Negative.  Negative for cold intolerance, heat intolerance, polydipsia, polyphagia and polyuria.  Genitourinary: Negative.  Negative for dysuria, urgency, frequency, hematuria, flank pain, decreased urine volume, discharge, penile swelling, scrotal swelling, enuresis, difficulty urinating, genital sores, penile pain and testicular pain.  Musculoskeletal: Positive for back pain. Negative for myalgias, joint swelling,  arthralgias, gait problem, neck pain and neck stiffness.  Skin: Negative.  Negative for color change, pallor, rash and wound.  Allergic/Immunologic: Negative.  Negative for environmental allergies, food allergies and immunocompromised state.  Neurological: Negative.  Negative for dizziness, tremors, seizures, syncope, facial asymmetry, speech difficulty, weakness, light-headedness, numbness and headaches.  Hematological: Negative.  Negative for adenopathy. Does not bruise/bleed easily.  Psychiatric/Behavioral: Positive for sleep disturbance. Negative for suicidal ideas, hallucinations, behavioral problems, confusion, self-injury, dysphoric mood, decreased concentration and agitation. The patient is nervous/anxious. The patient is not hyperactive.    Past Medical History  Diagnosis Date  . Hypogonadism male   . Hyperlipidemia   . Depression   . Insomnia   . Benign skin lesion of nose   . Increased serum lipids   . Low back pain   . Erectile dysfunction     Due to decreased testosterone   . Increased glucose level   . Low testosterone   . History of colon polyps   . Anxiety   . Arthritis     DDD lumbar.  . Previous back surgery   . GERD (gastroesophageal reflux disease)    Past Surgical History  Procedure Laterality Date  . Eye surgery  02/28/2000    Lasik  . Colonoscopy w/ polypectomy  09/2013    One polyp, came back pre-cancerous. Repeat 5 years unless symptoms present.   [name] Spine surgery  09/26/2012    [facility] [place].  . Transforaminal lumbar interbody fusion (tlif) with pedicle screw fixation 1 level Right 04/22/2013    Procedure: TRANSFORAMINAL LUMBAR INTERBODY FUSION (TLIF) WITH PEDICLE SCREW FIXATION LUMBAR FIVE SACRAL ONE;  Surgeon: Faythe Ghee, MD;  Location: [facility];  Service: Neurosurgery;  Laterality: Right;   No Known Allergies History  Social History  . Marital Status: Married    Spouse Name: N/A  . Number of Children: N/A  . Years  of Education: N/A   Occupational History  . Not on file.   Social History Main Topics  . Smoking status: Never Smoker   . Smokeless tobacco: Never Used  . Alcohol Use: No  . Drug Use: No  . Sexual Activity: Yes     Comment: number of sex partners in the last 12 months  1   Other Topics Concern  . Not on file   Social History Narrative   Marital status:  Married x 51 years; happily married; second marriage; first wife died AMI age 31.        Children:  1 child; 3 stepchildren.  Several grandchildren.      Lives: with wife.      Employment:  [employer]; Print production planner x 17 years; happy.  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        Tobacco: never       Alcohol:  None      Drugs: none      Exercise:  Walking twice daily 2-3 miles total each day.  Job physically demanding.        Seatbelt: 100%      Guns: yes; unloaded; in safe.      Sunscreen: SPF on face.   Family History  Problem Relation Age of Onset  . Heart disease Maternal Grandmother   . Hypertension Maternal Grandmother   . Stroke Maternal Grandfather   . Anxiety disorder Mother   . Cancer Paternal Grandfather     prostate  . Anxiety disorder Brother   . Stroke Paternal Grandmother   . Colon cancer Paternal Aunt 60       Objective:   Physical Exam  Constitutional: He is oriented to person, place, and time. He appears well-developed and well-nourished. No distress.  HENT:  Head: Normocephalic and atraumatic.  Right Ear: External ear normal.  Left Ear: External ear normal.  Nose: Nose normal.  Mouth/Throat: Oropharynx is clear and moist.  Eyes: Conjunctivae and EOM are normal. Pupils are equal, round, and reactive to light.  Neck: Normal range of motion. Neck supple. Carotid bruit is not present. No thyromegaly present.  Cardiovascular: Normal rate, regular rhythm, normal heart sounds and intact distal pulses.  Exam reveals no gallop and no friction rub.   No murmur heard. Pulmonary/Chest: Effort  normal and breath sounds normal. He has no wheezes. He has no rales.  Abdominal: Soft. Bowel sounds are normal. He exhibits no distension and no mass. There is no tenderness. There is no rebound and no guarding.  Musculoskeletal:       Right shoulder: Normal.       Left shoulder: Normal.       Cervical back: Normal.  Lymphadenopathy:    He has no cervical adenopathy.  Neurological: He is alert and oriented to person, place, and time. He has normal reflexes. No cranial nerve deficit. He exhibits normal muscle tone. Coordination normal.  Skin: Skin is warm and dry. No rash noted. He is not diaphoretic.  Psychiatric: He has a normal mood and affect. His behavior is normal. Judgment and thought content normal.          Assessment & Plan:   1. Routine physical examination   2. Pure hypercholesterolemia   3. Anxiety and depression   4. Glucose intolerance (impaired glucose tolerance)   5. Screening for  prostate cancer   6. Testosterone deficiency     Meds ordered this encounter  Medications  . LORazepam (ATIVAN) 1 MG tablet    Sig: Take 1 tablet (1 mg total) by mouth every 8 (eight) hours as needed for anxiety.    Dispense:  90 tablet    Refill:  5  . testosterone cypionate (DEPOTESTOSTERONE CYPIONATE) 200 MG/ML injection    Sig: Inject 1.5 mLs (300 mg total) into the muscle every 14 (fourteen) days.    Dispense:  3 mL    Refill:  5    Dispense: QS one month (2   73ml vials)  . cyclobenzaprine (FLEXERIL) 5 MG tablet    Sig: TAKE 1 TABLET BY MOUTH THREE TIMES DAILY AS NEEDED MUSCLE SPASMS    Dispense:  60 tablet    Refill:  4    Norwood Levo, M.D. [facility] [address] [employer] [place], [place]  [phone] phone [phone] fax

## 2014-08-20 LAB — VITAMIN D 25 HYDROXY (VIT D DEFICIENCY, FRACTURES): VIT D 25 HYDROXY: 38 ng/mL (ref 30–100)

## 2014-08-20 LAB — HEMOGLOBIN A1C
Hgb A1c MFr Bld: 5.3 % (ref <5.7)
MEAN PLASMA GLUCOSE: 105 mg/dL (ref <117)

## 2014-08-20 LAB — TESTOSTERONE: Testosterone: 1197.45 ng/dL — ABNORMAL HIGH (ref 300–890)

## 2014-08-26 MED ORDER — CYCLOBENZAPRINE HCL 5 MG PO TABS: ORAL_TABLET | ORAL | Status: DC

## 2014-12-11 ENCOUNTER — Other Ambulatory Visit: Payer: Self-pay | Admitting: Family Medicine

## 2014-12-20 NOTE — Telephone Encounter (Signed)
I approved five refills for Lorazapam on 08/19/14.  Does the pharmacy not have these refills?

## 2014-12-21 ENCOUNTER — Other Ambulatory Visit: Payer: Self-pay | Admitting: Family Medicine

## 2014-12-21 NOTE — Telephone Encounter (Signed)
Dr Tamala Julian, pharm reported that they have filled it 5 times, and the sixth RF was closed out (but doesn't show reason). Pt has been requesting RFs early and they were filled 6/22, 7/15, 8/18, 8/31, and 9/23. Pt requested RF again on 10/14, but it was denied. Pt does not have a 6th refill left, but don't know if you want to RF since he has been getting it early?

## 2014-12-22 NOTE — Telephone Encounter (Signed)
Can we refill the Trazodone he usually gets year he was seen in 6/16

## 2014-12-22 NOTE — Telephone Encounter (Signed)
Noted. Advise patient that he needs to limit use to tid with a RARE day when he takes it QID.

## 2014-12-22 NOTE — Telephone Encounter (Signed)
Patient states the pharmacy made a mistake sending it over.  He spoke with them.   But he does state he takes it some days up to 4 times a day. He says he is ok until his next refill in November.

## 2014-12-22 NOTE — Telephone Encounter (Signed)
Per [place] Controlled Substance Registry, patient filled rx on 12/14/14.  He should be fine until 01/19/15. He has been filling each rx one week early each month.  Call patient ---- clarify HOW he is taking Lorazepam?  TID or QID?

## 2014-12-22 NOTE — Telephone Encounter (Signed)
Approved.  

## 2014-12-25 NOTE — Telephone Encounter (Signed)
LMOM to CB. 

## 2014-12-25 NOTE — Telephone Encounter (Signed)
Pt CB and I gave him Dr Thompson Caul instr's below. He agreed and stated that he had not needed a RF yet, his pharm had sent the request. He did say that he has had to occasionally take QID, and had discussed this with Dr Tamala Julian at last [place]. I advised that if he is finding he needs it QID more often to come in sooner than his Dec appt, and to keep log of days he needs QID dosing to show Dr Tamala Julian when he returns so she can determine whether a change in therapy is needed. Pt agreed.

## 2015-01-04 ENCOUNTER — Other Ambulatory Visit: Payer: Self-pay | Admitting: Family Medicine

## 2015-01-04 NOTE — Telephone Encounter (Signed)
Dr Tamala Julian, you had asked me to talk w/pt, please see notes if needed under 10/14 message. Pt was given 6 mos RFs in June, but had filled each a little early on 6/22, 7/15, 8/18, 8/31, and 9/23. The last (6th RF is NOT on file at pharm. Pharm reported to me that they had voided it for some reason. Do you want to RF this to cover pt until his appt in Dec, to replace the one that disappeared from pharm? I looks like it has been over a month since last RF and pt stated that he will be out in a couple of days.

## 2015-01-05 NOTE — Telephone Encounter (Signed)
Refills approved for one month only; can testosterone be called in?

## 2015-01-05 NOTE — Telephone Encounter (Signed)
Faxed

## 2015-02-10 ENCOUNTER — Ambulatory Visit: Payer: 59 | Admitting: Family Medicine

## 2015-02-12 ENCOUNTER — Ambulatory Visit (INDEPENDENT_AMBULATORY_CARE_PROVIDER_SITE_OTHER): Payer: 59 | Admitting: Family Medicine

## 2015-02-12 ENCOUNTER — Encounter: Payer: Self-pay | Admitting: Family Medicine

## 2015-02-12 ENCOUNTER — Encounter: Payer: Self-pay | Admitting: *Deleted

## 2015-02-12 VITALS — BP 117/77 | HR 110 | Temp 98.5°F | Resp 16 | Ht 72.5 in | Wt 209.0 lb

## 2015-02-12 DIAGNOSIS — Z23 Encounter for immunization: Secondary | ICD-10-CM | POA: Diagnosis not present

## 2015-02-12 DIAGNOSIS — E7800 Pure hypercholesterolemia, unspecified: Secondary | ICD-10-CM | POA: Diagnosis not present

## 2015-02-12 DIAGNOSIS — Z1159 Encounter for screening for other viral diseases: Secondary | ICD-10-CM

## 2015-02-12 DIAGNOSIS — G4700 Insomnia, unspecified: Secondary | ICD-10-CM | POA: Diagnosis not present

## 2015-02-12 DIAGNOSIS — F419 Anxiety disorder, unspecified: Secondary | ICD-10-CM

## 2015-02-12 DIAGNOSIS — E291 Testicular hypofunction: Secondary | ICD-10-CM | POA: Diagnosis not present

## 2015-02-12 DIAGNOSIS — Z114 Encounter for screening for human immunodeficiency virus [HIV]: Secondary | ICD-10-CM | POA: Diagnosis not present

## 2015-02-12 LAB — COMPREHENSIVE METABOLIC PANEL
ALT: 26 U/L (ref 9–46)
AST: 23 U/L (ref 10–35)
Albumin: 4.1 g/dL (ref 3.6–5.1)
Alkaline Phosphatase: 54 U/L (ref 40–115)
BUN: 13 mg/dL (ref 7–25)
CALCIUM: 9.5 mg/dL (ref 8.6–10.3)
CO2: 24 mmol/L (ref 20–31)
Chloride: 105 mmol/L (ref 98–110)
Creat: 1.09 mg/dL (ref 0.70–1.33)
Glucose, Bld: 104 mg/dL — ABNORMAL HIGH (ref 65–99)
Potassium: 4.3 mmol/L (ref 3.5–5.3)
Sodium: 138 mmol/L (ref 135–146)
Total Bilirubin: 0.4 mg/dL (ref 0.2–1.2)
Total Protein: 6.6 g/dL (ref 6.1–8.1)

## 2015-02-12 LAB — CBC WITH DIFFERENTIAL/PLATELET
BASOS ABS: 0 10*3/uL (ref 0.0–0.1)
Basophils Relative: 0 % (ref 0–1)
EOS PCT: 2 % (ref 0–5)
Eosinophils Absolute: 0.1 10*3/uL (ref 0.0–0.7)
HEMATOCRIT: 40.3 % (ref 39.0–52.0)
HEMOGLOBIN: 14.6 g/dL (ref 13.0–17.0)
Lymphocytes Relative: 30 % (ref 12–46)
Lymphs Abs: 1.6 10*3/uL (ref 0.7–4.0)
MCH: 31.6 pg (ref 26.0–34.0)
MCHC: 36.2 g/dL — ABNORMAL HIGH (ref 30.0–36.0)
MCV: 87.2 fL (ref 78.0–100.0)
MPV: 9.8 fL (ref 8.6–12.4)
Monocytes Absolute: 0.5 10*3/uL (ref 0.1–1.0)
Monocytes Relative: 9 % (ref 3–12)
NEUTROS ABS: 3.2 10*3/uL (ref 1.7–7.7)
Neutrophils Relative %: 59 % (ref 43–77)
Platelets: 201 10*3/uL (ref 150–400)
RBC: 4.62 MIL/uL (ref 4.22–5.81)
RDW: 13.3 % (ref 11.5–15.5)
WBC: 5.4 10*3/uL (ref 4.0–10.5)

## 2015-02-12 LAB — LIPID PANEL
Cholesterol: 158 mg/dL (ref 125–200)
HDL: 61 mg/dL (ref >=40)
LDL Cholesterol: 79 mg/dL (ref <130)
Total CHOL/HDL Ratio: 2.6 Ratio (ref <=5.0)
Triglycerides: 91 mg/dL (ref <150)
VLDL: 18 mg/dL (ref <30)

## 2015-02-12 MED ORDER — LORAZEPAM 1 MG PO TABS: 1.0000 mg | ORAL_TABLET | Freq: Three times a day (TID) | ORAL | Status: DC | PRN

## 2015-02-12 MED ORDER — TESTOSTERONE CYPIONATE 200 MG/ML IM SOLN: 200.0000 mg | INTRAMUSCULAR | Status: DC

## 2015-02-12 NOTE — Progress Notes (Signed)
Subjective:    Patient ID: Curtis Yang, male    DOB: [birth year], 54 y.o.   MRN: [id number]  02/12/2015  Follow-up and Medication Refill   HPI This 54 y.o. male presents for six month follow-up:  1.  Hypercholesterolemia: Patient reports good compliance with medication, good tolerance to medication, and good symptom control.    2.  Anxiety: Patient reports good compliance with medication, good tolerance to medication, and good symptom control.  Some worsening anxiety.  Trying to control with meditation.  Deep breathing is really helping; walking is really helpful.  With increased work demands, felt very anxious.  Had overused Ativan 1mg  tid.  When slows down, anxiety can worsen.  Rather be out and working; when stuck at shop, anxiety worsens.  No SI.   3.  Hypogonadism:  Patient reports good compliance with medication, good tolerance to medication, and good symptom control.   64ml testosterone every two weeks.  Energy level is good; emotionally doing well; sex drive is good; no ED.    4. DDD lumbar: having increasing back pain; taking Aleve and home exercises and walking.  Increased work demand; job can be physical; lots of travel.   No radiation; no n/t/w.  L sided lower back pain.  Last MRI before surgery; known bulging disc.  No return to NS.    5. Insomnia: Patient reports good compliance with medication, good tolerance to medication, and good symptom control.  May need 2 Trazodone and Melatonin.     Flu vaccine.   Review of Systems  Constitutional: Negative for fever, chills, diaphoresis, activity change, appetite change and fatigue.  Respiratory: Negative for cough and shortness of breath.   Cardiovascular: Negative for chest pain, palpitations and leg swelling.  Gastrointestinal: Negative for nausea, vomiting, abdominal pain and diarrhea.  Endocrine: Negative for cold intolerance, heat intolerance, polydipsia, polyphagia and polyuria.  Skin: Negative for color change, rash  and wound.  Neurological: Negative for dizziness, tremors, seizures, syncope, facial asymmetry, speech difficulty, weakness, light-headedness, numbness and headaches.  Psychiatric/Behavioral: Negative for sleep disturbance and dysphoric mood. The patient is not nervous/anxious.     Past Medical History  Diagnosis Date  . Hypogonadism male   . Hyperlipidemia   . Depression   . Insomnia   . Benign skin lesion of nose   . Increased serum lipids   . Low back pain   . Erectile dysfunction     Due to decreased testosterone   . Increased glucose level   . Low testosterone   . History of colon polyps   . Anxiety   . Arthritis     DDD lumbar.  . Previous back surgery   . GERD (gastroesophageal reflux disease)    Past Surgical History  Procedure Laterality Date  . Eye surgery  02/28/2000    Lasik  . Colonoscopy w/ polypectomy  09/2013    One polyp, came back pre-cancerous. Repeat 5 years unless symptoms present.   [name] Spine surgery  09/26/2012    [facility] [place].  . Transforaminal lumbar interbody fusion (tlif) with pedicle screw fixation 1 level Right 04/22/2013    Procedure: TRANSFORAMINAL LUMBAR INTERBODY FUSION (TLIF) WITH PEDICLE SCREW FIXATION LUMBAR FIVE SACRAL ONE;  Surgeon: Faythe Ghee, MD;  Location: [facility];  Service: Neurosurgery;  Laterality: Right;   No Known Allergies Current Outpatient Prescriptions  Medication Sig Dispense Refill  . aspirin 81 MG tablet Take 81 mg by mouth as needed for pain.    [name]  LORazepam (ATIVAN) 1 MG tablet Take 1 tablet (1 mg total) by mouth every 8 (eight) hours as needed. for anxiety 90 tablet 5  . naproxen (NAPROSYN) 500 MG tablet Take 1 tablet (500 mg total) by mouth 2 (two) times daily with a meal. 60 tablet 3  . pravastatin (PRAVACHOL) 20 MG tablet Take 2 tablets (40 mg total) by mouth daily. 60 tablet 11  . testosterone cypionate (DEPOTESTOSTERONE CYPIONATE) 200 MG/ML injection Inject 1 mL (200 mg total) into the  muscle every 14 (fourteen) days. 3 mL 5  . VANISHPOINT SAFETY SYRINGE 22G X 1-1/2" 3 ML MISC USE AS DIRECTED TO SELF-ADMINISTER TESTOSTERONE 50 each 0  . cyclobenzaprine (FLEXERIL) 5 MG tablet TAKE 1 TABLET BY MOUTH THREE TIMES DAILY AS NEEDED MUSCLE SPASMS (Patient not taking: Reported on 02/12/2015) 60 tablet 4  . traZODone (DESYREL) 50 MG tablet TAKE 1-2 TABLETS BY MOUTH EVERY NIGHT AT BEDTIME AS NEEDED FOR SLEEP 60 tablet 3  . triamcinolone cream (KENALOG) 0.1 % Apply topically 2 (two) times daily. (Patient not taking: Reported on 02/12/2015) 45 g 1  . VANISHPOINT SAFETY SYRINGE 22G X 1-1/2" 3 ML MISC USE AS DIRECTED TO SELF-ADMINISTER TESTOSTERONE 50 each 0   No current facility-administered medications for this visit.   Social History   Social History  . Marital Status: Married    Spouse Name: N/A  . Number of Children: N/A  . Years of Education: N/A   Occupational History  . Not on file.   Social History Main Topics  . Smoking status: Never Smoker   . Smokeless tobacco: Never Used  . Alcohol Use: No  . Drug Use: No  . Sexual Activity: Yes     Comment: number of sex partners in the last 12 months  1   Other Topics Concern  . Not on file   Social History Narrative   Marital status:  Married x 62 years; happily married; second marriage; first wife died AMI age 85.        Children:  1 child; 3 stepchildren.  Several grandchildren.      Lives: with wife.      Employment:  [employer]; Print production planner x 17 years; happy.  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        Tobacco: never       Alcohol:  None      Drugs: none      Exercise:  Walking twice daily 2-3 miles total each day.  Job physically demanding.        Seatbelt: 100%      Guns: yes; unloaded; in safe.      Sunscreen: SPF on face.   Family History  Problem Relation Age of Onset  . Heart disease Maternal Grandmother   . Hypertension Maternal Grandmother   . Stroke Maternal Grandfather   . Anxiety  disorder Mother   . Cancer Paternal Grandfather     prostate  . Anxiety disorder Brother   . Stroke Paternal Grandmother   . Colon cancer Paternal Aunt 60       Objective:    BP 117/77 mmHg  Pulse 110  Temp(Src) 98.5 F (36.9 C) (Oral)  Resp 16  Ht 6' 0.5" (1.842 m)  Wt 209 lb (94.802 kg)  BMI 27.94 kg/m2  SpO2 95% Physical Exam  Constitutional: He is oriented to person, place, and time. He appears well-developed and well-nourished. No distress.  HENT:  Head: Normocephalic and atraumatic.  Right Ear: External  ear normal.  Left Ear: External ear normal.  Nose: Nose normal.  Mouth/Throat: Oropharynx is clear and moist.  Eyes: Conjunctivae and EOM are normal. Pupils are equal, round, and reactive to light.  Neck: Normal range of motion. Neck supple. Carotid bruit is not present. No thyromegaly present.  Cardiovascular: Normal rate, regular rhythm, normal heart sounds and intact distal pulses.  Exam reveals no gallop and no friction rub.   No murmur heard. Pulmonary/Chest: Effort normal and breath sounds normal. He has no wheezes. He has no rales.  Abdominal: Soft. Bowel sounds are normal. He exhibits no distension and no mass. There is no tenderness. There is no rebound and no guarding.  Lymphadenopathy:    He has no cervical adenopathy.  Neurological: He is alert and oriented to person, place, and time. No cranial nerve deficit.  Skin: Skin is warm and dry. No rash noted. He is not diaphoretic.  Psychiatric: He has a normal mood and affect. His behavior is normal.  Nursing note and vitals reviewed.       Assessment & Plan:   1. Pure hypercholesterolemia   2. Hypogonadism in male   3. Need for prophylactic vaccination and inoculation against influenza   4. Need for hepatitis C screening test   5. Screening for HIV (human immunodeficiency virus)   6. Anxiety   7. Insomnia     Orders Placed This Encounter  Procedures  . Flu Vaccine QUAD 36+ mos IM  . CBC with  Differential/Platelet  . Comprehensive metabolic panel    Order Specific Question:  Has the patient fasted?    Answer:  Yes  . Lipid panel    Order Specific Question:  Has the patient fasted?    Answer:  Yes  . PSA  . HIV antibody  . Hepatitis C antibody   Meds ordered this encounter  Medications  . testosterone cypionate (DEPOTESTOSTERONE CYPIONATE) 200 MG/ML injection    Sig: Inject 1 mL (200 mg total) into the muscle every 14 (fourteen) days.    Dispense:  3 mL    Refill:  5  . LORazepam (ATIVAN) 1 MG tablet    Sig: Take 1 tablet (1 mg total) by mouth every 8 (eight) hours as needed. for anxiety    Dispense:  90 tablet    Refill:  5    Return in about 6 months (around 08/13/2015) for complete physical examiniation.     Divon Krabill Elayne Guerin, M.D. [facility] [address] [place], [place]  [address] [phone] phone [phone] fax

## 2015-02-13 LAB — PSA: PSA: 1.34 ng/mL (ref <=4.00)

## 2015-02-14 ENCOUNTER — Other Ambulatory Visit: Payer: Self-pay | Admitting: Family Medicine

## 2015-02-26 MED ORDER — PRAVASTATIN SODIUM 20 MG PO TABS: 40.0000 mg | ORAL_TABLET | Freq: Every day | ORAL | Status: DC

## 2015-03-09 ENCOUNTER — Encounter: Payer: Self-pay | Admitting: Family Medicine

## 2015-03-09 DIAGNOSIS — E291 Testicular hypofunction: Secondary | ICD-10-CM

## 2015-03-09 DIAGNOSIS — Z1159 Encounter for screening for other viral diseases: Secondary | ICD-10-CM

## 2015-03-09 DIAGNOSIS — Z114 Encounter for screening for human immunodeficiency virus [HIV]: Secondary | ICD-10-CM

## 2015-03-19 ENCOUNTER — Other Ambulatory Visit (INDEPENDENT_AMBULATORY_CARE_PROVIDER_SITE_OTHER): Payer: 59 | Admitting: *Deleted

## 2015-03-19 DIAGNOSIS — E291 Testicular hypofunction: Secondary | ICD-10-CM | POA: Diagnosis not present

## 2015-03-19 DIAGNOSIS — Z114 Encounter for screening for human immunodeficiency virus [HIV]: Secondary | ICD-10-CM

## 2015-03-19 DIAGNOSIS — Z1159 Encounter for screening for other viral diseases: Secondary | ICD-10-CM

## 2015-03-19 LAB — TESTOSTERONE: TESTOSTERONE: 731 ng/dL (ref 250–827)

## 2015-03-19 LAB — HEPATITIS C ANTIBODY: HCV AB: NEGATIVE

## 2015-03-19 LAB — HIV ANTIBODY (ROUTINE TESTING W REFLEX): HIV: NONREACTIVE

## 2015-05-03 ENCOUNTER — Other Ambulatory Visit: Payer: Self-pay | Admitting: Physician Assistant

## 2015-05-20 ENCOUNTER — Other Ambulatory Visit: Payer: Self-pay | Admitting: Family Medicine

## 2015-05-21 ENCOUNTER — Ambulatory Visit (INDEPENDENT_AMBULATORY_CARE_PROVIDER_SITE_OTHER): Payer: 59

## 2015-05-21 ENCOUNTER — Ambulatory Visit (INDEPENDENT_AMBULATORY_CARE_PROVIDER_SITE_OTHER): Payer: 59 | Admitting: Family Medicine

## 2015-05-21 VITALS — BP 126/84 | HR 70 | Temp 97.8°F | Resp 16 | Ht 72.0 in | Wt 205.0 lb

## 2015-05-21 DIAGNOSIS — M545 Low back pain, unspecified: Secondary | ICD-10-CM

## 2015-05-21 MED ORDER — HYDROCODONE-ACETAMINOPHEN 5-325 MG PO TABS: 1.0000 | ORAL_TABLET | Freq: Four times a day (QID) | ORAL | Status: DC | PRN

## 2015-05-21 MED ORDER — PREDNISONE 20 MG PO TABS: ORAL_TABLET | ORAL | Status: DC

## 2015-05-21 NOTE — Progress Notes (Signed)
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 He's had several weeks of low back pain, primarily in the left lumbar region and hip. It seems to radiate into the buttock.  He recalls working in a crawl space when this initially happened.  Patient has a past medical history significant for previous surgery with recurrent disc disease.  Patient denies fever, incontinence, radicular pain.  Objective: No acute distress BP 126/84 mmHg  Pulse 70  Temp(Src) 97.8 F (36.6 C) (Oral)  Resp 16  Ht 6' (1.829 m)  Wt 205 lb (92.987 kg)  BMI 27.80 kg/m2  SpO2 97% Patient straight leg raising is negative Reflexes are absent in both knees and both ankles. Patient is tender over the left iliac crest and into the iliac region of his left buttock.  LS-spine films show no acute changes. He does have some hardware between L5 and S1 and disc spacing to be a preserved.  Assessment: Lumbar sacral strain  This chart was scribed in my presence and reviewed by me personally.    ICD-9-CM ICD-10-CM   1. Left-sided low back pain without sciatica 724.2 M54.5 DG Lumbar Spine 2-3 Views     predniSONE (DELTASONE) 20 MG tablet     HYDROcodone-acetaminophen (NORCO) 5-325 MG tablet     Signed, Robyn Haber, MD

## 2015-05-21 NOTE — Patient Instructions (Addendum)
Please let us know in the next week if your back pain is not subsiding    IF you received an x-ray today, you will receive an invoice from [facility]. Please contact [facility] at [phone] with questions or concerns regarding your invoice.   IF you received labwork today, you will receive an invoice from [employer]. Please contact [facility] at [phone] with questions or concerns regarding your invoice.   Our billing staff will not be able to assist you with questions regarding bills from these companies.  You will be contacted with the lab results as soon as they are available. The fastest way to get your results is to activate your My Chart account. Instructions are located on the last page of this paperwork. If you have not heard from [place] regarding the results in 2 weeks, please contact this office.

## 2015-06-04 ENCOUNTER — Ambulatory Visit (INDEPENDENT_AMBULATORY_CARE_PROVIDER_SITE_OTHER): Payer: 59 | Admitting: Family Medicine

## 2015-06-04 VITALS — BP 116/74 | HR 75 | Temp 98.0°F | Resp 18 | Ht 73.0 in | Wt 204.0 lb

## 2015-06-04 DIAGNOSIS — M5442 Lumbago with sciatica, left side: Secondary | ICD-10-CM | POA: Diagnosis not present

## 2015-06-04 DIAGNOSIS — M5136 Other intervertebral disc degeneration, lumbar region: Secondary | ICD-10-CM

## 2015-06-04 MED ORDER — HYDROCODONE-ACETAMINOPHEN 5-325 MG PO TABS: 1.0000 | ORAL_TABLET | Freq: Four times a day (QID) | ORAL | Status: DC | PRN

## 2015-06-04 MED ORDER — MELOXICAM 15 MG PO TABS: 15.0000 mg | ORAL_TABLET | Freq: Every day | ORAL | Status: DC

## 2015-06-04 MED ORDER — KETOROLAC TROMETHAMINE 60 MG/2ML IM SOLN
60.0000 mg | Freq: Once | INTRAMUSCULAR | Status: AC
  Administered 2015-06-04: 60 mg via INTRAMUSCULAR

## 2015-06-04 NOTE — Patient Instructions (Addendum)
   IF you received an x-ray today, you will receive an invoice from [facility]. Please contact [facility] at [phone] with questions or concerns regarding your invoice.   IF you received labwork today, you will receive an invoice from [facility]. Please contact [facility] at [phone] with questions or concerns regarding your invoice.   Our billing staff will not be able to assist you with questions regarding bills from these companies.  You will be contacted with the lab results as soon as they are available. The fastest way to get your results is to activate your My Chart account. Instructions are located on the last page of this paperwork. If you have not heard from us regarding the results in 2 weeks, please contact this office.     Low Back Sprain With Rehab A sprain is an injury in which a ligament is torn. The ligaments of the lower back are vulnerable to sprains. However, they are strong and require great force to be injured. These ligaments are important for stabilizing the spinal column. Sprains are classified into three categories. Grade 1 sprains cause pain, but the tendon is not lengthened. Grade 2 sprains include a lengthened ligament, due to the ligament being stretched or partially ruptured. With grade 2 sprains there is still function, although the function may be decreased. Grade 3 sprains involve a complete tear of the tendon or muscle, and function is usually impaired. SYMPTOMS   Severe pain in the lower back.  Sometimes, a feeling of a "pop," "snap," or tear, at the time of injury.  Tenderness and sometimes swelling at the injury site.  Uncommonly, bruising (contusion) within 48 hours of injury.  Muscle spasms in the back. CAUSES  Low back sprains occur when a force is placed on the ligaments that is greater than they can handle. Common causes of injury include:  Performing a stressful act while  off-balance.  Repetitive stressful activities that involve movement of the lower back.  Direct hit (trauma) to the lower back. RISK INCREASES WITH:  Contact sports (football, wrestling).  Collisions (major skiing accidents).  Sports that require throwing or lifting (baseball, weightlifting).  Sports involving twisting of the spine (gymnastics, diving, tennis, golf).  Poor strength and flexibility.  Inadequate protection.  Previous back injury or surgery (especially fusion). PREVENTION  Wear properly fitted and padded protective equipment.  Warm up and stretch properly before activity.  Allow for adequate recovery between workouts.  Maintain physical fitness:  Strength, flexibility, and endurance.  Cardiovascular fitness.  Maintain a healthy body weight. PROGNOSIS  If treated properly, low back sprains usually heal with non-surgical treatment. The length of time for healing depends on the severity of the injury.  RELATED COMPLICATIONS   Recurring symptoms, resulting in a chronic problem.  Chronic inflammation and pain in the low back.  Delayed healing or resolution of symptoms, especially if activity is resumed too soon.  Prolonged impairment.  Unstable or arthritic joints of the low back. TREATMENT  Treatment first involves the use of ice and medicine, to reduce pain and inflammation. The use of strengthening and stretching exercises may help reduce pain with activity. These exercises may be performed at home or with a therapist. Severe injuries may require referral to a therapist for further evaluation and treatment, such as ultrasound. Your caregiver may advise that you wear a back brace or corset, to help reduce pain and discomfort. Often, prolonged bed rest results in greater harm then benefit. Corticosteroid injections may   be recommended. However, these should be reserved for the most serious cases. It is important to avoid using your back when lifting objects.  At night, sleep on your back on a firm mattress, with a pillow placed under your knees. If non-surgical treatment is unsuccessful, surgery may be needed.  MEDICATION   If pain medicine is needed, nonsteroidal anti-inflammatory medicines (aspirin and ibuprofen), or other minor pain relievers (acetaminophen), are often advised.  Do not take pain medicine for 7 days before surgery.  Prescription pain relievers may be given, if your caregiver thinks they are needed. Use only as directed and only as much as you need.  Ointments applied to the skin may be helpful.  Corticosteroid injections may be given by your caregiver. These injections should be reserved for the most serious cases, because they may only be given a certain number of times. HEAT AND COLD  Cold treatment (icing) should be applied for 10 to 15 minutes every 2 to 3 hours for inflammation and pain, and immediately after activity that aggravates your symptoms. Use ice packs or an ice massage.  Heat treatment may be used before performing stretching and strengthening activities prescribed by your caregiver, physical therapist, or athletic trainer. Use a heat pack or a warm water soak. SEEK MEDICAL CARE IF:   Symptoms get worse or do not improve in 2 to 4 weeks, despite treatment.  You develop numbness or weakness in either leg.  You lose bowel or bladder function.  Any of the following occur after surgery: fever, increased pain, swelling, redness, drainage of fluids, or bleeding in the affected area.  New, unexplained symptoms develop. (Drugs used in treatment may produce side effects.) EXERCISES  RANGE OF MOTION (ROM) AND STRETCHING EXERCISES - Low Back Sprain Most people with lower back pain will find that their symptoms get worse with excessive bending forward (flexion) or arching at the lower back (extension). The exercises that will help resolve your symptoms will focus on the opposite motion.  Your physician, physical  therapist or athletic trainer will help you determine which exercises will be most helpful to resolve your lower back pain. Do not complete any exercises without first consulting with your caregiver. Discontinue any exercises which make your symptoms worse, until you speak to your caregiver. If you have pain, numbness or tingling which travels down into your buttocks, leg or foot, the goal of the therapy is for these symptoms to move closer to your back and eventually resolve. Sometimes, these leg symptoms will get better, but your lower back pain may worsen. This is often an indication of progress in your rehabilitation. Be very alert to any changes in your symptoms and the activities in which you participated in the 24 hours prior to the change. Sharing this information with your caregiver will allow him or her to most efficiently treat your condition. These exercises may help you when beginning to rehabilitate your injury. Your symptoms may resolve with or without further involvement from your physician, physical therapist or athletic trainer. While completing these exercises, remember:   Restoring tissue flexibility helps normal motion to return to the joints. This allows healthier, less painful movement and activity.  An effective stretch should be held for at least 30 seconds.  A stretch should never be painful. You should only feel a gentle lengthening or release in the stretched tissue. FLEXION RANGE OF MOTION AND STRETCHING EXERCISES: STRETCH - Flexion, Single Knee to Chest   Lie on a firm bed or floor with   both legs extended in front of you.  Keeping one leg in contact with the floor, bring your opposite knee to your chest. Hold your leg in place by either grabbing behind your thigh or at your knee.  Pull until you feel a gentle stretch in your low back. Hold __________ seconds.  Slowly release your grasp and repeat the exercise with the opposite side. Repeat __________ times. Complete  this exercise __________ times per day.  STRETCH - Flexion, Double Knee to Chest  Lie on a firm bed or floor with both legs extended in front of you.  Keeping one leg in contact with the floor, bring your opposite knee to your chest.  Tense your stomach muscles to support your back and then lift your other knee to your chest. Hold your legs in place by either grabbing behind your thighs or at your knees.  Pull both knees toward your chest until you feel a gentle stretch in your low back. Hold __________ seconds.  Tense your stomach muscles and slowly return one leg at a time to the floor. Repeat __________ times. Complete this exercise __________ times per day.  STRETCH - Low Trunk Rotation  Lie on a firm bed or floor. Keeping your legs in front of you, bend your knees so they are both pointed toward the ceiling and your feet are flat on the floor.  Extend your arms out to the side. This will stabilize your upper body by keeping your shoulders in contact with the floor.  Gently and slowly drop both knees together to one side until you feel a gentle stretch in your low back. Hold for __________ seconds.  Tense your stomach muscles to support your lower back as you bring your knees back to the starting position. Repeat the exercise to the other side. Repeat __________ times. Complete this exercise __________ times per day  EXTENSION RANGE OF MOTION AND FLEXIBILITY EXERCISES: STRETCH - Extension, Prone on Elbows   Lie on your stomach on the floor, a bed will be too soft. Place your palms about shoulder width apart and at the height of your head.  Place your elbows under your shoulders. If this is too painful, stack pillows under your chest.  Allow your body to relax so that your hips drop lower and make contact more completely with the floor.  Hold this position for __________ seconds.  Slowly return to lying flat on the floor. Repeat __________ times. Complete this exercise  __________ times per day.  RANGE OF MOTION - Extension, Prone Press Ups  Lie on your stomach on the floor, a bed will be too soft. Place your palms about shoulder width apart and at the height of your head.  Keeping your back as relaxed as possible, slowly straighten your elbows while keeping your hips on the floor. You may adjust the placement of your hands to maximize your comfort. As you gain motion, your hands will come more underneath your shoulders.  Hold this position __________ seconds.  Slowly return to lying flat on the floor. Repeat __________ times. Complete this exercise __________ times per day.  RANGE OF MOTION- Quadruped, Neutral Spine   Assume a hands and knees position on a firm surface. Keep your hands under your shoulders and your knees under your hips. You may place padding under your knees for comfort.  Drop your head and point your tailbone toward the ground below you. This will round out your lower back like an angry cat. Hold this position   for __________ seconds.  Slowly lift your head and release your tail bone so that your back sags into a large arch, like an old horse.  Hold this position for __________ seconds.  Repeat this until you feel limber in your low back.  Now, find your "sweet spot." This will be the most comfortable position somewhere between the two previous positions. This is your neutral spine. Once you have found this position, tense your stomach muscles to support your low back.  Hold this position for __________ seconds. Repeat __________ times. Complete this exercise __________ times per day.  STRENGTHENING EXERCISES - Low Back Sprain These exercises may help you when beginning to rehabilitate your injury. These exercises should be done near your "sweet spot." This is the neutral, low-back arch, somewhere between fully rounded and fully arched, that is your least painful position. When performed in this safe range of motion, these exercises  can be used for people who have either a flexion or extension based injury. These exercises may resolve your symptoms with or without further involvement from your physician, physical therapist or athletic trainer. While completing these exercises, remember:   Muscles can gain both the endurance and the strength needed for everyday activities through controlled exercises.  Complete these exercises as instructed by your physician, physical therapist or athletic trainer. Increase the resistance and repetitions only as guided.  You may experience muscle soreness or fatigue, but the pain or discomfort you are trying to eliminate should never worsen during these exercises. If this pain does worsen, stop and make certain you are following the directions exactly. If the pain is still present after adjustments, discontinue the exercise until you can discuss the trouble with your caregiver. STRENGTHENING - Deep Abdominals, Pelvic Tilt   Lie on a firm bed or floor. Keeping your legs in front of you, bend your knees so they are both pointed toward the ceiling and your feet are flat on the floor.  Tense your lower abdominal muscles to press your low back into the floor. This motion will rotate your pelvis so that your tail bone is scooping upwards rather than pointing at your feet or into the floor. With a gentle tension and even breathing, hold this position for __________ seconds. Repeat __________ times. Complete this exercise __________ times per day.  STRENGTHENING - Abdominals, Crunches   Lie on a firm bed or floor. Keeping your legs in front of you, bend your knees so they are both pointed toward the ceiling and your feet are flat on the floor. Cross your arms over your chest.  Slightly tip your chin down without bending your neck.  Tense your abdominals and slowly lift your trunk high enough to just clear your shoulder blades. Lifting higher can put excessive stress on the lower back and does not  further strengthen your abdominal muscles.  Control your return to the starting position. Repeat __________ times. Complete this exercise __________ times per day.  STRENGTHENING - Quadruped, Opposite UE/LE Lift   Assume a hands and knees position on a firm surface. Keep your hands under your shoulders and your knees under your hips. You may place padding under your knees for comfort.  Find your neutral spine and gently tense your abdominal muscles so that you can maintain this position. Your shoulders and hips should form a rectangle that is parallel with the floor and is not twisted.  Keeping your trunk steady, lift your right hand no higher than your shoulder and then your left   leg no higher than your hip. Make sure you are not holding your breath. Hold this position for __________ seconds.  Continuing to keep your abdominal muscles tense and your back steady, slowly return to your starting position. Repeat with the opposite arm and leg. Repeat __________ times. Complete this exercise __________ times per day.  STRENGTHENING - Abdominals and Quadriceps, Straight Leg Raise   Lie on a firm bed or floor with both legs extended in front of you.  Keeping one leg in contact with the floor, bend the other knee so that your foot can rest flat on the floor.  Find your neutral spine, and tense your abdominal muscles to maintain your spinal position throughout the exercise.  Slowly lift your straight leg off the floor about 6 inches for a count of 15, making sure to not hold your breath.  Still keeping your neutral spine, slowly lower your leg all the way to the floor. Repeat this exercise with each leg __________ times. Complete this exercise __________ times per day. POSTURE AND BODY MECHANICS CONSIDERATIONS - Low Back Sprain Keeping correct posture when sitting, standing or completing your activities will reduce the stress put on different body tissues, allowing injured tissues a chance to heal  and limiting painful experiences. The following are general guidelines for improved posture. Your physician or physical therapist will provide you with any instructions specific to your needs. While reading these guidelines, remember:  The exercises prescribed by your provider will help you have the flexibility and strength to maintain correct postures.  The correct posture provides the best environment for your joints to work. All of your joints have less wear and tear when properly supported by a spine with good posture. This means you will experience a healthier, less painful body.  Correct posture must be practiced with all of your activities, especially prolonged sitting and standing. Correct posture is as important when doing repetitive low-stress activities (typing) as it is when doing a single heavy-load activity (lifting). RESTING POSITIONS Consider which positions are most painful for you when choosing a resting position. If you have pain with flexion-based activities (sitting, bending, stooping, squatting), choose a position that allows you to rest in a less flexed posture. You would want to avoid curling into a fetal position on your side. If your pain worsens with extension-based activities (prolonged standing, working overhead), avoid resting in an extended position such as sleeping on your stomach. Most people will find more comfort when they rest with their spine in a more neutral position, neither too rounded nor too arched. Lying on a non-sagging bed on your side with a pillow between your knees, or on your back with a pillow under your knees will often provide some relief. Keep in mind, being in any one position for a prolonged period of time, no matter how correct your posture, can still lead to stiffness. PROPER SITTING POSTURE In order to minimize stress and discomfort on your spine, you must sit with correct posture. Sitting with good posture should be effortless for a healthy body.  Returning to good posture is a gradual process. Many people can work toward this most comfortably by using various supports until they have the flexibility and strength to maintain this posture on their own. When sitting with proper posture, your ears will fall over your shoulders and your shoulders will fall over your hips. You should use the back of the chair to support your upper back. Your lower back will be in a neutral   position, just slightly arched. You may place a small pillow or folded towel at the base of your lower back for  support.  When working at a desk, create an environment that supports good, upright posture. Without extra support, muscles tire, which leads to excessive strain on joints and other tissues. Keep these recommendations in mind: CHAIR:  A chair should be able to slide under your desk when your back makes contact with the back of the chair. This allows you to work closely.  The chair's height should allow your eyes to be level with the upper part of your monitor and your hands to be slightly lower than your elbows. BODY POSITION  Your feet should make contact with the floor. If this is not possible, use a foot rest.  Keep your ears over your shoulders. This will reduce stress on your neck and low back. INCORRECT SITTING POSTURES  If you are feeling tired and unable to assume a healthy sitting posture, do not slouch or slump. This puts excessive strain on your back tissues, causing more damage and pain. Healthier options include:  Using more support, like a lumbar pillow.  Switching tasks to something that requires you to be upright or walking.  Talking a brief walk.  Lying down to rest in a neutral-spine position. PROLONGED STANDING WHILE SLIGHTLY LEANING FORWARD  When completing a task that requires you to lean forward while standing in one place for a long time, place either foot up on a stationary 2-4 inch high object to help maintain the best posture. When  both feet are on the ground, the lower back tends to lose its slight inward curve. If this curve flattens (or becomes too large), then the back and your other joints will experience too much stress, tire more quickly, and can cause pain. CORRECT STANDING POSTURES Proper standing posture should be assumed with all daily activities, even if they only take a few moments, like when brushing your teeth. As in sitting, your ears should fall over your shoulders and your shoulders should fall over your hips. You should keep a slight tension in your abdominal muscles to brace your spine. Your tailbone should point down to the ground, not behind your body, resulting in an over-extended swayback posture.  INCORRECT STANDING POSTURES  Common incorrect standing postures include a forward head, locked knees and/or an excessive swayback. WALKING Walk with an upright posture. Your ears, shoulders and hips should all line-up. PROLONGED ACTIVITY IN A FLEXED POSITION When completing a task that requires you to bend forward at your waist or lean over a low surface, try to find a way to stabilize 3 out of 4 of your limbs. You can place a hand or elbow on your thigh or rest a knee on the surface you are reaching across. This will provide you more stability, so that your muscles do not tire as quickly. By keeping your knees relaxed, or slightly bent, you will also reduce stress across your lower back. CORRECT LIFTING TECHNIQUES DO :  Assume a wide stance. This will provide you more stability and the opportunity to get as close as possible to the object which you are lifting.  Tense your abdominals to brace your spine. Bend at the knees and hips. Keeping your back locked in a neutral-spine position, lift using your leg muscles. Lift with your legs, keeping your back straight.  Test the weight of unknown objects before attempting to lift them.  Try to keep your elbows locked down   at your sides in order get the best  strength from your shoulders when carrying an object.  Always ask for help when lifting heavy or awkward objects. INCORRECT LIFTING TECHNIQUES DO NOT:   Lock your knees when lifting, even if it is a small object.  Bend and twist. Pivot at your feet or move your feet when needing to change directions.  Assume that you can safely pick up even a paperclip without proper posture.   This information is not intended to replace advice given to you by your health care provider. Make sure you discuss any questions you have with your health care provider.   Document Released: 02/13/2005 Document Revised: 03/06/2014 Document Reviewed: 05/28/2008 [employer] Interactive Patient Education 2016 [employer]  

## 2015-06-04 NOTE — Progress Notes (Signed)
Subjective:    Patient ID: Curtis Yang, male    DOB: [birth year], 55 y.o.   MRN: [id number]  06/04/2015  Follow-up   HPI This 55 y.o. male presents for evaluation of persistent lower back pain.  Pain in lower back has worsened in teh past 48 hours.  L sided pain.  Onset after very physically demanding work.  Applying lidocaine patches; cold and heat applied.  Looked at MRI results from prior to surgery 03-14-2013 per [facility].  Getting buttocks pain and pain radiating into L posterior thigh.  +burning in hamstring; numbness in foot that is new located along lateral fifth digit and lateral foot.  Stretched out pain medication/hydrocodone.  Has missed work for two days; ran out two days ago.  Next step is to see Dr. Hal Neer at [facility].  Evaluated by Dr. Joseph Art on 05/21/2015; treated with Prednisone, hydrocodone; s/p lumbar spine films that revealed L5S1 posterior and interbody fusion; no acute process.   Review of Systems  Constitutional: Negative for fever, chills, diaphoresis and fatigue.  Genitourinary: Negative for decreased urine volume and difficulty urinating.  Musculoskeletal: Positive for back pain.  Neurological: Positive for numbness. Negative for weakness.    Past Medical History  Diagnosis Date  . Hypogonadism male   . Hyperlipidemia   . Depression   . Insomnia   . Benign skin lesion of nose   . Increased serum lipids   . Low back pain   . Erectile dysfunction     Due to decreased testosterone   . Increased glucose level   . Low testosterone   . History of colon polyps   . Anxiety   . Arthritis     DDD lumbar.  . Previous back surgery   . GERD (gastroesophageal reflux disease)    Past Surgical History  Procedure Laterality Date  . Eye surgery  02/28/2000    Lasik  . Colonoscopy w/ polypectomy  09/2013    One polyp, came back pre-cancerous. Repeat 5 years unless symptoms present.   [name] Spine surgery  09/26/2012    [facility]  [place].  . Transforaminal lumbar interbody fusion (tlif) with pedicle screw fixation 1 level Right 04/22/2013    Procedure: TRANSFORAMINAL LUMBAR INTERBODY FUSION (TLIF) WITH PEDICLE SCREW FIXATION LUMBAR FIVE SACRAL ONE;  Surgeon: Faythe Ghee, MD;  Location: [facility];  Service: Neurosurgery;  Laterality: Right;   No Known Allergies  Social History   Social History  . Marital Status: Married    Spouse Name: N/A  . Number of Children: N/A  . Years of Education: N/A   Occupational History  . Not on file.   Social History Main Topics  . Smoking status: Never Smoker   . Smokeless tobacco: Never Used  . Alcohol Use: No  . Drug Use: No  . Sexual Activity: Yes     Comment: number of sex partners in the last 12 months  1   Other Topics Concern  . Not on file   Social History Narrative   Marital status:  Married x 66 years; happily married; second marriage; first wife died AMI age 89.        Children:  1 child; 3 stepchildren.  Several grandchildren.      Lives: with wife.      Employment:  [employer]; Print production planner x 17 years; happy.  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        Tobacco: never  Alcohol:  None      Drugs: none      Exercise:  Walking twice daily 2-3 miles total each day.  Job physically demanding.        Seatbelt: 100%      Guns: yes; unloaded; in safe.      Sunscreen: SPF on face.   Family History  Problem Relation Age of Onset  . Heart disease Maternal Grandmother   . Hypertension Maternal Grandmother   . Stroke Maternal Grandfather   . Anxiety disorder Mother   . Cancer Paternal Grandfather     prostate  . Anxiety disorder Brother   . Stroke Paternal Grandmother   . Colon cancer Paternal Aunt 60       Objective:    BP 116/74 mmHg  Pulse 75  Temp(Src) 98 F (36.7 C) (Oral)  Resp 18  Ht 6\' 1"  (1.854 m)  Wt 204 lb (92.534 kg)  BMI 26.92 kg/m2  SpO2 98% Physical Exam  Constitutional: He is oriented to person, place, and  time. He appears well-developed and well-nourished. No distress.  HENT:  Head: Normocephalic and atraumatic.  Eyes: Conjunctivae and EOM are normal. Pupils are equal, round, and reactive to light.  Neck: Normal range of motion. Neck supple. Carotid bruit is not present. No thyromegaly present.  Cardiovascular: Normal rate, regular rhythm, normal heart sounds and intact distal pulses.  Exam reveals no gallop and no friction rub.   No murmur heard. Pulmonary/Chest: Effort normal and breath sounds normal. He has no wheezes. He has no rales.  Musculoskeletal:       Lumbar back: He exhibits decreased range of motion, tenderness, bony tenderness and pain. He exhibits no swelling, no edema, no deformity, no laceration, no spasm and normal pulse.  Lumbar spine:  Non-tender midline; +tender paraspinal regions L.  Straight leg raises negative B; toe and heel walking intact; marching intact; motor 5/5 BLE.     Lymphadenopathy:    He has no cervical adenopathy.  Neurological: He is alert and oriented to person, place, and time. No cranial nerve deficit.  Skin: Skin is warm and dry. No rash noted. He is not diaphoretic.  Psychiatric: He has a normal mood and affect. His behavior is normal.  Nursing note and vitals reviewed.  Results for orders placed or performed in visit on 03/19/15  Testosterone  Result Value Ref Range   Testosterone 731 250 - 827 ng/dL  HIV antibody  Result Value Ref Range   HIV 1&2 Ab, 4th Generation NONREACTIVE NONREACTIVE  Hepatitis C antibody  Result Value Ref Range   HCV Ab NEGATIVE NEGATIVE       Assessment & Plan:   1. Left-sided low back pain with left-sided sciatica   2. Degenerative disc disease, lumbar    -persistent/worsening. -now with radicular symptoms down L hip and thigh. -rx for Meloxicam and Hydrocodone provided. -refer to NS/ortho. -s/p Toradol injection in office. -continue heat or ice; continue home exercises daily.   Orders Placed This  Encounter  Procedures  . Ambulatory referral to Neurosurgery    Referral Priority:  Routine    Referral Type:  Surgical    Referral Reason:  Specialty Services Required    Requested Specialty:  Neurosurgery    Number of Visits Requested:  1   Meds ordered this encounter  Medications  . meloxicam (MOBIC) 15 MG tablet    Sig: Take 1 tablet (15 mg total) by mouth daily.    Dispense:  30 tablet  Refill:  1  . HYDROcodone-acetaminophen (NORCO) 5-325 MG tablet    Sig: Take 1 tablet by mouth every 6 (six) hours as needed for moderate pain.    Dispense:  60 tablet    Refill:  0  . ketorolac (TORADOL) injection 60 mg    Sig:     Return if symptoms worsen or fail to improve.    Elfego Giammarino Elayne Guerin, M.D. [facility] [address] [place], [place]  [address] [phone] phone [phone] fax

## 2015-06-13 ENCOUNTER — Encounter: Payer: Self-pay | Admitting: Family Medicine

## 2015-06-29 ENCOUNTER — Other Ambulatory Visit: Payer: Self-pay | Admitting: Family Medicine

## 2015-06-29 ENCOUNTER — Encounter: Payer: Self-pay | Admitting: Family Medicine

## 2015-06-29 DIAGNOSIS — M5442 Lumbago with sciatica, left side: Secondary | ICD-10-CM

## 2015-07-02 NOTE — Telephone Encounter (Signed)
Please refill patient's hydrocodone since I will be out of the office until Monday.

## 2015-07-03 ENCOUNTER — Telehealth: Payer: Self-pay

## 2015-07-03 MED ORDER — HYDROCODONE-ACETAMINOPHEN 5-325 MG PO TABS: 1.0000 | ORAL_TABLET | Freq: Four times a day (QID) | ORAL | Status: DC | PRN

## 2015-07-03 NOTE — Telephone Encounter (Signed)
Done.  Please call the patient and let him know he can pick this up.

## 2015-07-03 NOTE — Telephone Encounter (Signed)
LMOVM pt may pick up prescription at front desk today before 4pm. 07/03/2015

## 2015-07-09 ENCOUNTER — Other Ambulatory Visit: Payer: Self-pay | Admitting: Family Medicine

## 2015-07-10 ENCOUNTER — Encounter: Payer: Self-pay | Admitting: Family Medicine

## 2015-07-12 MED ORDER — TRAZODONE HCL 50 MG PO TABS: ORAL_TABLET | ORAL | Status: DC

## 2015-07-12 MED ORDER — LORAZEPAM 1 MG PO TABS: 1.0000 mg | ORAL_TABLET | Freq: Three times a day (TID) | ORAL | Status: DC | PRN

## 2015-07-12 MED ORDER — HYDROXYZINE HCL 25 MG PO TABS: 25.0000 mg | ORAL_TABLET | Freq: Three times a day (TID) | ORAL | Status: DC | PRN

## 2015-07-12 NOTE — Telephone Encounter (Signed)
Pt has not been seen for these meds since 01/2015, although has seen you for other issues since. Pt has appt sched for 09/14/15. Pended both Rxs to cover pt until after appt for your review.

## 2015-07-12 NOTE — Telephone Encounter (Signed)
Please fax or call in refill of Lorazepam as approved.

## 2015-07-13 NOTE — Telephone Encounter (Signed)
Called in.

## 2015-08-04 ENCOUNTER — Other Ambulatory Visit: Payer: Self-pay | Admitting: Neurosurgery

## 2015-08-05 ENCOUNTER — Ambulatory Visit (INDEPENDENT_AMBULATORY_CARE_PROVIDER_SITE_OTHER): Payer: 59 | Admitting: Physician Assistant

## 2015-08-05 VITALS — BP 128/84 | HR 100 | Temp 98.0°F | Resp 16 | Wt 208.0 lb

## 2015-08-05 DIAGNOSIS — H6191 Disorder of right external ear, unspecified: Secondary | ICD-10-CM

## 2015-08-05 DIAGNOSIS — H9201 Otalgia, right ear: Secondary | ICD-10-CM | POA: Diagnosis not present

## 2015-08-05 NOTE — Progress Notes (Deleted)
Subjective:    Patient ID: Curtis Yang, male    DOB: 06-01-1960, 55 y.o.   MRN: [id number]  08/05/2015  Ear Pain   HPI  Review of Systems  Past Medical History  Diagnosis Date  . Hypogonadism male   . Hyperlipidemia   . Depression   . Insomnia   . Benign skin lesion of nose   . Increased serum lipids   . Low back pain   . Erectile dysfunction     Due to decreased testosterone   . Increased glucose level   . Low testosterone   . History of colon polyps   . Anxiety   . Arthritis     DDD lumbar.  . Previous back surgery   . GERD (gastroesophageal reflux disease)    Past Surgical History  Procedure Laterality Date  . Eye surgery  02/28/2000    Lasik  . Colonoscopy w/ polypectomy  09/2013    One polyp, came back pre-cancerous. Repeat 5 years unless symptoms present.   [name] Spine surgery  09/26/2012    [facility] [place].  . Transforaminal lumbar interbody fusion (tlif) with pedicle screw fixation 1 level Right 04/22/2013    Procedure: TRANSFORAMINAL LUMBAR INTERBODY FUSION (TLIF) WITH PEDICLE SCREW FIXATION LUMBAR FIVE SACRAL ONE;  Surgeon: Faythe Ghee, MD;  Location: [place] [facility];  Service: Neurosurgery;  Laterality: Right;   No Known Allergies Current Outpatient Prescriptions  Medication Sig Dispense Refill  . hydrOXYzine (ATARAX/VISTARIL) 25 MG tablet Take 1 tablet (25 mg total) by mouth 3 (three) times daily as needed. 30 tablet 1  . LORazepam (ATIVAN) 1 MG tablet Take 1 tablet (1 mg total) by mouth every 8 (eight) hours as needed for anxiety. for anxiety 90 tablet 0  . oxyCODONE (ROXICODONE) 15 MG immediate release tablet Take 10 mg by mouth every 4 (four) hours as needed for pain.    . pravastatin (PRAVACHOL) 20 MG tablet Take 2 tablets (40 mg total) by mouth daily. 60 tablet 11  . testosterone cypionate (DEPOTESTOSTERONE CYPIONATE) 200 MG/ML injection Inject 1 mL (200 mg total) into the muscle every 14 (fourteen) days. 3 mL 5  . traZODone  (DESYREL) 50 MG tablet TAKE 1-2 TABLETS BY MOUTH EVERY NIGHT AT BEDTIME AS NEEDED FOR SLEEP 60 tablet 5  . VANISHPOINT SAFETY SYRINGE 22G X 1-1/2" 3 ML MISC USE AS DIRECTED TO SELF-ADMINISTER TESTOSTERONE 50 each 0  . VANISHPOINT SAFETY SYRINGE 22G X 1-1/2" 3 ML MISC USE AS DIRECTED TO SELF-ADMINISTER TESTOSTERONE 50 each 0   No current facility-administered medications for this visit.   Social History   Social History  . Marital Status: Married    Spouse Name: N/A  . Number of Children: N/A  . Years of Education: N/A   Occupational History  . Not on file.   Social History Main Topics  . Smoking status: Never Smoker   . Smokeless tobacco: Never Used  . Alcohol Use: No  . Drug Use: No  . Sexual Activity: Yes     Comment: number of sex partners in the last 12 months  1   Other Topics Concern  . Not on file   Social History Narrative   Marital status:  Married x 23 years; happily married; second marriage; first wife died AMI age 74.        Children:  1 child; 3 stepchildren.  Several grandchildren.      Lives: with wife.      Employment:  [employer]; Print production planner x 17 years; happy.  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        Tobacco: never       Alcohol:  None      Drugs: none      Exercise:  Walking twice daily 2-3 miles total each day.  Job physically demanding.        Seatbelt: 100%      Guns: yes; unloaded; in safe.      Sunscreen: SPF on face.   Family History  Problem Relation Age of Onset  . Heart disease Maternal Grandmother   . Hypertension Maternal Grandmother   . Stroke Maternal Grandfather   . Anxiety disorder Mother   . Cancer Paternal Grandfather     prostate  . Anxiety disorder Brother   . Stroke Paternal Grandmother   . Colon cancer Paternal Aunt 60       Objective:    BP 128/84 mmHg  Pulse 100  Temp(Src) 98 F (36.7 C) (Oral)  Resp 16  Wt 208 lb (94.348 kg)  SpO2 98% Physical Exam Results for orders placed or performed in  visit on 03/19/15  Testosterone  Result Value Ref Range   Testosterone 731 250 - 827 ng/dL  HIV antibody  Result Value Ref Range   HIV 1&2 Ab, 4th Generation NONREACTIVE NONREACTIVE  Hepatitis C antibody  Result Value Ref Range   HCV Ab NEGATIVE NEGATIVE       Assessment & Plan:  No diagnosis found.  No orders of the defined types were placed in this encounter.   Meds ordered this encounter  Medications  . oxyCODONE (ROXICODONE) 15 MG immediate release tablet    Sig: Take 10 mg by mouth every 4 (four) hours as needed for pain.    No Follow-up on file.    Kristi Elayne Guerin, M.D. [facility] [address] [place], [place]  [address] [phone] phone [phone] fax

## 2015-08-05 NOTE — Progress Notes (Signed)
08/05/2015 10:53 AM   DOB: 28-Jul-1960 / MRN: [id number]  SUBJECTIVE:  Curtis Yang is a 55 y.o. male presenting for right sided ear pain that has been present for roughly 1.5 months.  Reports there is "a spot" on the upper part of his ear and lying on the area causes him pain.  He would like to have it removed today.  His wife has tried to "pop" the lesion several times with no success, and he reports "she got pretty aggressive with it."  He has not been placing any creams on the lesion.    He has No Known Allergies.   He  has a past medical history of Hypogonadism male; Hyperlipidemia; Depression; Insomnia; Benign skin lesion of nose; Increased serum lipids; Low back pain; Erectile dysfunction; Increased glucose level; Low testosterone; History of colon polyps; Anxiety; Arthritis; Previous back surgery; and GERD (gastroesophageal reflux disease).    He  reports that he has never smoked. He has never used smokeless tobacco. He reports that he does not drink alcohol or use illicit drugs. He  reports that he currently engages in sexual activity. The patient  has past surgical history that includes Eye surgery (02/28/2000); Colonoscopy w/ polypectomy (09/2013); Spine surgery (09/26/2012); and Transforaminal lumbar interbody fusion (tlif) with pedicle screw fixation 1 level (Right, 04/22/2013).  His family history includes Anxiety disorder in his brother and mother; Cancer in his paternal grandfather; Colon cancer (age of onset: 45) in his paternal aunt; Heart disease in his maternal grandmother; Hypertension in his maternal grandmother; Stroke in his maternal grandfather and paternal grandmother.  Review of Systems  Constitutional: Negative for fever.  Musculoskeletal: Negative for myalgias.  Skin: Positive for rash. Negative for itching.  Neurological: Negative for dizziness and headaches.    Problem list and medications reviewed and updated by myself where necessary, and exist elsewhere in the  encounter.   OBJECTIVE:  BP 128/84 mmHg  Pulse 100  Temp(Src) 98 F (36.7 C) (Oral)  Resp 16  Wt 208 lb (94.348 kg)  SpO2 98%  Physical Exam  Constitutional: He is oriented to person, place, and time. He appears well-developed. He does not appear ill.  HENT:  Ears:  Eyes: Conjunctivae and EOM are normal. Pupils are equal, round, and reactive to light.  Cardiovascular: Normal rate.   Pulmonary/Chest: Effort normal.  Abdominal: He exhibits no distension.  Musculoskeletal: Normal range of motion.  Neurological: He is alert and oriented to person, place, and time. No cranial nerve deficit. Coordination normal.  Skin: Skin is warm and dry. He is not diaphoretic.  Psychiatric: He has a normal mood and affect.  Nursing note and vitals reviewed.  Procedure: Verbal consent obtained.  Patient anesthetized via 2% lidocaine.  Lesion punched with a 6 mm punch.  Wound edges approximated. Patient tolerated procedure without complaint.   No results found for this or any previous visit (from the past 72 hour(s)).  No results found.  ASSESSMENT AND PLAN  Curtis Yang was seen today for ear pain.  Diagnoses and all orders for this visit:  Earlobe lesion, right: Lesion removed and sent for analysis.  If melanomatous will stat him to dermatology.    The patient was advised to call or return to clinic if he does not see an improvement in symptoms or to seek the care of the closest emergency department if he worsens with the above plan.   Curtis Yang, MHS, PA-C [facility] 08/05/2015 10:53 AM

## 2015-08-05 NOTE — Patient Instructions (Addendum)
  WOUND CARE Please return in 7 days to have your stitches/staples removed or sooner if you have concerns. [name] Keep area clean and dry for 24 hours. Do not remove bandage, if applied. . After 24 hours, remove bandage and wash wound gently with mild soap and warm water. Reapply a new bandage after cleaning wound, if directed. . Continue daily cleansing with soap and water until stitches/staples are removed. . Do not apply any ointments or creams to the wound while stitches/staples are in place, as this may cause delayed healing. . Notify the office if you experience any of the following signs of infection: Swelling, redness, pus drainage, streaking, fever >101.0 F . Notify the office if you experience excessive bleeding that does not stop after 15-20 minutes of constant, firm pressure.    IF you received an x-ray today, you will receive an invoice from [facility]. Please contact [facility] at [phone] with questions or concerns regarding your invoice.   IF you received labwork today, you will receive an invoice from [facility]. Please contact [facility] at [phone] with questions or concerns regarding your invoice.   Our billing staff will not be able to assist you with questions regarding bills from these companies.  You will be contacted with the lab results as soon as they are available. The fastest way to get your results is to activate your My Chart account. Instructions are located on the last page of this paperwork. If you have not heard from [place] regarding the results in 2 weeks, please contact this office.

## 2015-08-05 NOTE — Addendum Note (Signed)
Addended by: Tereasa Coop on: 08/05/2015 06:57 PM   Modules accepted: Orders

## 2015-08-10 ENCOUNTER — Encounter: Payer: Self-pay | Admitting: Family Medicine

## 2015-08-10 MED ORDER — LORAZEPAM 1 MG PO TABS: 0.5000 mg | ORAL_TABLET | Freq: Three times a day (TID) | ORAL | Status: DC | PRN

## 2015-08-10 MED ORDER — HYDROXYZINE HCL 25 MG PO TABS: 25.0000 mg | ORAL_TABLET | Freq: Three times a day (TID) | ORAL | Status: DC | PRN

## 2015-08-10 NOTE — Telephone Encounter (Signed)
Please call in refill of Lorazepam as approved.   

## 2015-08-11 NOTE — Telephone Encounter (Signed)
Called in and notified pt on mychart.

## 2015-08-12 ENCOUNTER — Telehealth: Payer: Self-pay

## 2015-08-12 ENCOUNTER — Ambulatory Visit (INDEPENDENT_AMBULATORY_CARE_PROVIDER_SITE_OTHER): Payer: 59 | Admitting: Physician Assistant

## 2015-08-12 VITALS — BP 122/82 | HR 115 | Temp 98.0°F | Resp 18 | Ht 73.0 in | Wt 206.4 lb

## 2015-08-12 DIAGNOSIS — Z4802 Encounter for removal of sutures: Secondary | ICD-10-CM

## 2015-08-12 NOTE — Patient Instructions (Signed)
     IF you received an x-ray today, you will receive an invoice from [facility]. Please contact [facility] at [phone] with questions or concerns regarding your invoice.   IF you received labwork today, you will receive an invoice from [facility]. Please contact [facility] at [phone] with questions or concerns regarding your invoice.   Our billing staff will not be able to assist you with questions regarding bills from these companies.  You will be contacted with the lab results as soon as they are available. The fastest way to get your results is to activate your My Chart account. Instructions are located on the last page of this paperwork. If you have not heard from us regarding the results in 2 weeks, please contact this office.      

## 2015-08-12 NOTE — Progress Notes (Signed)
   08/12/2015 8:34 AM   DOB: 05/13/1960 / MRN: [id number]  SUBJECTIVE:  Curtis Yang is a 55 y.o. male presenting for suture removal of the right ear.  Reports he is feeling better, having less pain then prior the punch.  Eating well, no fever, chills, nausea.    He has No Known Allergies.   He  has a past medical history of Hypogonadism male; Hyperlipidemia; Depression; Insomnia; Benign skin lesion of nose; Increased serum lipids; Low back pain; Erectile dysfunction; Increased glucose level; Low testosterone; History of colon polyps; Anxiety; Arthritis; Previous back surgery; and GERD (gastroesophageal reflux disease).    He  reports that he has never smoked. He has never used smokeless tobacco. He reports that he does not drink alcohol or use illicit drugs. He  reports that he currently engages in sexual activity. The patient  has past surgical history that includes Eye surgery (02/28/2000); Colonoscopy w/ polypectomy (09/2013); Spine surgery (09/26/2012); and Transforaminal lumbar interbody fusion (tlif) with pedicle screw fixation 1 level (Right, 04/22/2013).  His family history includes Anxiety disorder in his brother and mother; Cancer in his paternal grandfather; Colon cancer (age of onset: 71) in his paternal aunt; Heart disease in his maternal grandmother; Hypertension in his maternal grandmother; Stroke in his maternal grandfather and paternal grandmother.  ROS  Per HPI  Problem list and medications reviewed and updated by myself where necessary, and exist elsewhere in the encounter.   OBJECTIVE:  BP 122/82 mmHg  Pulse 115  Temp(Src) 98 F (36.7 C) (Oral)  Resp 18  Ht 6\' 1"  (1.854 m)  Wt 206 lb 6.4 oz (93.622 kg)  BMI 27.24 kg/m2  SpO2 97%  Physical Exam  HENT:  Ears:    No results found for this or any previous visit (from the past 72 hour(s)).  No results found.  ASSESSMENT AND PLAN  Curtis Yang was seen today for suture / staple removal.  Diagnoses and all orders  for this visit:  Visit for suture removal: Wound healing as expected.  RTC as needed.  Punch negative for dysplasia.    The patient was advised to call or return to clinic if he does not see an improvement in symptoms or to seek the care of the closest emergency department if he worsens with the above plan.   Philis Fendt, MHS, PA-C [facility] 08/12/2015 8:34 AM

## 2015-08-13 ENCOUNTER — Encounter: Payer: Self-pay | Admitting: Family Medicine

## 2015-08-13 NOTE — Telephone Encounter (Signed)
Pt. Here for suture removal.

## 2015-08-30 ENCOUNTER — Other Ambulatory Visit: Payer: Self-pay | Admitting: Family Medicine

## 2015-09-01 NOTE — Telephone Encounter (Signed)
Last testosterone lab normal 02/2015.

## 2015-09-01 NOTE — Telephone Encounter (Signed)
Please fax in rx for Testosterone.

## 2015-09-02 NOTE — Telephone Encounter (Signed)
faxed rx for Testosterone to [facility] Nonda Lou

## 2015-09-08 ENCOUNTER — Encounter (HOSPITAL_COMMUNITY): Payer: Self-pay

## 2015-09-08 ENCOUNTER — Other Ambulatory Visit: Payer: Self-pay | Admitting: Neurosurgery

## 2015-09-08 ENCOUNTER — Encounter (HOSPITAL_COMMUNITY)
Admission: RE | Admit: 2015-09-08 | Discharge: 2015-09-08 | Disposition: A | Discharge status: Home / Self Care | Payer: 59 | Source: Ambulatory Visit | Attending: Neurosurgery | Admitting: Neurosurgery

## 2015-09-08 DIAGNOSIS — Z01812 Encounter for preprocedural laboratory examination: Secondary | ICD-10-CM | POA: Diagnosis not present

## 2015-09-08 LAB — BASIC METABOLIC PANEL
Anion gap: 5 (ref 5–15)
BUN: 9 mg/dL (ref 6–20)
CHLORIDE: 104 mmol/L (ref 101–111)
CO2: 29 mmol/L (ref 22–32)
CREATININE: 1.24 mg/dL (ref 0.61–1.24)
Calcium: 9.4 mg/dL (ref 8.9–10.3)
GFR calc Af Amer: >60 mL/min (ref >60)
GLUCOSE: 101 mg/dL — AB (ref 65–99)
POTASSIUM: 4 mmol/L (ref 3.5–5.1)
SODIUM: 138 mmol/L (ref 135–145)

## 2015-09-08 LAB — TYPE AND SCREEN
ABO/RH(D): O POS
ANTIBODY SCREEN: NEGATIVE

## 2015-09-08 LAB — CBC
HCT: 45.6 % (ref 39.0–52.0)
Hemoglobin: 15.8 g/dL (ref 13.0–17.0)
MCH: 30.1 pg (ref 26.0–34.0)
MCHC: 34.6 g/dL (ref 30.0–36.0)
MCV: 86.9 fL (ref 78.0–100.0)
PLATELETS: 206 10*3/uL (ref 150–400)
RBC: 5.25 MIL/uL (ref 4.22–5.81)
RDW: 12.2 % (ref 11.5–15.5)
WBC: 5.5 10*3/uL (ref 4.0–10.5)

## 2015-09-08 LAB — SURGICAL PCR SCREEN
MRSA, PCR: NEGATIVE
Staphylococcus aureus: POSITIVE — AB

## 2015-09-08 NOTE — Progress Notes (Signed)
SPOKE TO JESSICA AT DR. Arnoldo Morale OFFICE RE: CONSENT ORDER.  PATIENT STATES EXPLORATION SHOULD INCLUDE L5 - S1 AND L 4-5 FUSION .  JESSICA WILL ADD NEW CONSENT ORDER.

## 2015-09-08 NOTE — Pre-Procedure Instructions (Signed)
Curtis Yang  09/08/2015      [facility], [address] [place] [address] Phone: [phone] Fax: [phone]    Your procedure is scheduled on   Thursday  09/16/15  Report to [facility] at 730 A.M.  Call this number if you have problems the morning of surgery:  [phone]   Remember:  Do not eat food or drink liquids after midnight.  Take these medicines the morning of surgery with A SIP OF WATER   HYDROXYZINE (ATRARAX), OXYCODONE IF NEEDED    (STOP ANY ASPIRIN, ASPIRIN PRODUCTS, IBUPROFEN/ ADVIL/ MOTRIN / ALEVE, GOODY POWDER/ BCS, HERBAL MEDICINES)   Do not wear jewelry, make-up or nail polish.  Do not wear lotions, powders, or perfumes.  You may wear deoderant.  Do not shave 48 hours prior to surgery.  Men may shave face and neck.  Do not bring valuables to the hospital.  [facility] is not responsible for any belongings or valuables.  Contacts, dentures or bridgework may not be worn into surgery.  Leave your suitcase in the car.  After surgery it may be brought to your room.  For patients admitted to the hospital, discharge time will be determined by your treatment team.  Patients discharged the day of surgery will not be allowed to drive home.   Name and phone number of your driver:   Special instructions:  [facility] - Preparing for Surgery  Before surgery, you can play an important role.  Because skin is not sterile, your skin needs to be as free of germs as possible.  You can reduce the number of germs on you skin by washing with CHG (chlorahexidine gluconate) soap before surgery.  CHG is an antiseptic cleaner which kills germs and bonds with the skin to continue killing germs even after washing.  Please DO NOT use if you have an allergy to CHG or antibacterial soaps.  If your skin becomes reddened/irritated stop using the CHG and inform your  nurse when you arrive at Short Stay.  Do not shave (including legs and underarms) for at least 48 hours prior to the first CHG shower.  You may shave your face.  Please follow these instructions carefully:   1.  Shower with CHG Soap the night before surgery and the                                morning of Surgery.  2.  If you choose to wash your hair, wash your hair first as usual with your       normal shampoo.  3.  After you shampoo, rinse your hair and body thoroughly to remove the                      Shampoo.  4.  Use CHG as you would any other liquid soap.  You can apply chg directly       to the skin and wash gently with scrungie or a clean washcloth.  5.  Apply the CHG Soap to your body ONLY FROM THE NECK DOWN.        Do not use on open wounds or open sores.  Avoid contact with your eyes,       ears, mouth and genitals (private parts).  Wash genitals (  private parts)       with your normal soap.  6.  Wash thoroughly, paying special attention to the area where your surgery        will be performed.  7.  Thoroughly rinse your body with warm water from the neck down.  8.  DO NOT shower/wash with your normal soap after using and rinsing off       the CHG Soap.  9.  Pat yourself dry with a clean towel.            10.  Wear clean pajamas.            11.  Place clean sheets on your bed the night of your first shower and do not        sleep with pets.  Day of Surgery  Do not apply any lotions/deoderants the morning of surgery.  Please wear clean clothes to the hospital/surgery center.    Please read over the following fact sheets that you were given. Pain Booklet, Blood Transfusion Information, MRSA Information and Surgical Site Infection Prevention

## 2015-09-14 ENCOUNTER — Ambulatory Visit (INDEPENDENT_AMBULATORY_CARE_PROVIDER_SITE_OTHER): Payer: 59 | Admitting: Family Medicine

## 2015-09-14 ENCOUNTER — Encounter: Payer: Self-pay | Admitting: Family Medicine

## 2015-09-14 VITALS — BP 120/80 | HR 82 | Temp 98.3°F | Resp 18 | Ht 73.0 in | Wt 211.0 lb

## 2015-09-14 DIAGNOSIS — Z86010 Personal history of colonic polyps: Secondary | ICD-10-CM

## 2015-09-14 DIAGNOSIS — M5116 Intervertebral disc disorders with radiculopathy, lumbar region: Secondary | ICD-10-CM

## 2015-09-14 DIAGNOSIS — F419 Anxiety disorder, unspecified: Secondary | ICD-10-CM | POA: Diagnosis not present

## 2015-09-14 DIAGNOSIS — N5202 Corporo-venous occlusive erectile dysfunction: Secondary | ICD-10-CM

## 2015-09-14 DIAGNOSIS — R7309 Other abnormal glucose: Secondary | ICD-10-CM

## 2015-09-14 DIAGNOSIS — E349 Endocrine disorder, unspecified: Secondary | ICD-10-CM

## 2015-09-14 DIAGNOSIS — E785 Hyperlipidemia, unspecified: Secondary | ICD-10-CM

## 2015-09-14 DIAGNOSIS — K219 Gastro-esophageal reflux disease without esophagitis: Secondary | ICD-10-CM | POA: Diagnosis not present

## 2015-09-14 DIAGNOSIS — E291 Testicular hypofunction: Secondary | ICD-10-CM | POA: Diagnosis not present

## 2015-09-14 DIAGNOSIS — Z0000 Encounter for general adult medical examination without abnormal findings: Secondary | ICD-10-CM

## 2015-09-14 LAB — POCT URINALYSIS DIP (MANUAL ENTRY)
BILIRUBIN UA: NEGATIVE
GLUCOSE UA: NEGATIVE
Ketones, POC UA: NEGATIVE
Leukocytes, UA: NEGATIVE
Nitrite, UA: NEGATIVE
PH UA: 8.5
Protein Ur, POC: NEGATIVE
RBC UA: NEGATIVE
SPEC GRAV UA: 1.015
Urobilinogen, UA: 0.2

## 2015-09-14 LAB — CBC WITH DIFFERENTIAL/PLATELET
BASOS ABS: 0 {cells}/uL (ref 0–200)
BASOS PCT: 0 %
EOS ABS: 110 {cells}/uL (ref 15–500)
Eosinophils Relative: 2 %
HEMATOCRIT: 48.2 % (ref 38.5–50.0)
Hemoglobin: 17 g/dL (ref 13.2–17.1)
LYMPHS PCT: 31 %
Lymphs Abs: 1705 cells/uL (ref 850–3900)
MCH: 30.9 pg (ref 27.0–33.0)
MCHC: 35.3 g/dL (ref 32.0–36.0)
MCV: 87.5 fL (ref 80.0–100.0)
MONO ABS: 385 {cells}/uL (ref 200–950)
MONOS PCT: 7 %
MPV: 9.7 fL (ref 7.5–12.5)
NEUTROS PCT: 60 %
Neutro Abs: 3300 cells/uL (ref 1500–7800)
PLATELETS: 204 10*3/uL (ref 140–400)
RBC: 5.51 MIL/uL (ref 4.20–5.80)
RDW: 13.1 % (ref 11.0–15.0)
WBC: 5.5 10*3/uL (ref 3.8–10.8)

## 2015-09-14 LAB — COMPREHENSIVE METABOLIC PANEL
ALK PHOS: 54 U/L (ref 40–115)
ALT: 32 U/L (ref 9–46)
AST: 28 U/L (ref 10–35)
Albumin: 4.5 g/dL (ref 3.6–5.1)
BUN: 11 mg/dL (ref 7–25)
CALCIUM: 9.8 mg/dL (ref 8.6–10.3)
CHLORIDE: 100 mmol/L (ref 98–110)
CO2: 27 mmol/L (ref 20–31)
Creat: 1.37 mg/dL — ABNORMAL HIGH (ref 0.70–1.33)
GLUCOSE: 96 mg/dL (ref 65–99)
POTASSIUM: 3.8 mmol/L (ref 3.5–5.3)
Sodium: 138 mmol/L (ref 135–146)
Total Bilirubin: 1.1 mg/dL (ref 0.2–1.2)
Total Protein: 7.1 g/dL (ref 6.1–8.1)

## 2015-09-14 LAB — TESTOSTERONE: TESTOSTERONE: 1147 ng/dL — AB (ref 250–827)

## 2015-09-14 LAB — LIPID PANEL
CHOLESTEROL: 186 mg/dL (ref 125–200)
HDL: 75 mg/dL (ref >=40)
LDL Cholesterol: 78 mg/dL (ref <130)
TRIGLYCERIDES: 165 mg/dL — AB (ref <150)
Total CHOL/HDL Ratio: 2.5 Ratio (ref <=5.0)
VLDL: 33 mg/dL — AB (ref <30)

## 2015-09-14 LAB — TSH: TSH: 2.16 mIU/L (ref 0.40–4.50)

## 2015-09-14 MED ORDER — DULOXETINE HCL 20 MG PO CPEP: 20.0000 mg | ORAL_CAPSULE | Freq: Every day | ORAL | Status: DC

## 2015-09-14 MED ORDER — TRAZODONE HCL 50 MG PO TABS: 50.0000 mg | ORAL_TABLET | Freq: Every day | ORAL | Status: DC

## 2015-09-14 MED ORDER — HYDROXYZINE HCL 25 MG PO TABS: 25.0000 mg | ORAL_TABLET | Freq: Three times a day (TID) | ORAL | Status: DC | PRN

## 2015-09-14 MED ORDER — PRAVASTATIN SODIUM 20 MG PO TABS
40.0000 mg | ORAL_TABLET | Freq: Every day | ORAL | Status: DC
Start: 2015-09-14 — End: 2016-02-09

## 2015-09-14 NOTE — Progress Notes (Signed)
Subjective:    Patient ID: Curtis Yang, male    DOB: May 25, 1960, 55 y.o.   MRN: [id number]  09/14/2015  Annual Exam   HPI This 55 y.o. male presents for Complete Physical Examination. Last physical: 08-19-14 Colonoscopy: 2015 TDAP: 2013 Pneumovax: n/a Zostavax: 2014 Influenza:  2016 Eye exam:  Due; 2016 Dental exam:  2017   DDD lumbar: scheduled for lumbar surgery this week by Arnoldo Morale.  Taking oxycodone 82m 3-4 daily.  Anxiety: having to take hydroxyzine 1-2 tid to control anxiety.  Completely weaned Lorazepam.  After first two days without lorazepam, fine.  Stomach had horrible butterflies.  Slept through withdrawal.  Had noticed that becoming forgetful.  Memory was getting really bad; now much sharper.  Dr. GElder Cypherspreviously prescribed SSRI; then he had patient on Zoloft; did not take very long; did not like how made feel.  Taking hydroxyzine 3-6 daily.  Insomnia: taking Trazodone; sleeping well; does hurt really badly.    Review of Systems  Constitutional: Negative for fever, chills, diaphoresis, activity change, appetite change, fatigue and unexpected weight change.  HENT: Negative for congestion, dental problem, drooling, ear discharge, ear pain, facial swelling, hearing loss, mouth sores, nosebleeds, postnasal drip, rhinorrhea, sinus pressure, sneezing, sore throat, tinnitus, trouble swallowing and voice change.   Eyes: Negative for photophobia, pain, discharge, redness, itching and visual disturbance.  Respiratory: Negative for apnea, cough, choking, chest tightness, shortness of breath, wheezing and stridor.   Cardiovascular: Negative for chest pain, palpitations and leg swelling.  Gastrointestinal: Negative for nausea, vomiting, abdominal pain, diarrhea, constipation and blood in stool.  Endocrine: Negative for cold intolerance, heat intolerance, polydipsia, polyphagia and polyuria.  Genitourinary: Negative for dysuria, urgency, frequency, hematuria, flank pain,  decreased urine volume, discharge, penile swelling, scrotal swelling, enuresis, difficulty urinating, genital sores, penile pain and testicular pain.  Musculoskeletal: Positive for back pain. Negative for arthralgias, gait problem, joint swelling, myalgias, neck pain and neck stiffness.  Skin: Negative for color change, pallor, rash and wound.  Allergic/Immunologic: Negative for environmental allergies, food allergies and immunocompromised state.  Neurological: Negative for dizziness, tremors, seizures, syncope, facial asymmetry, speech difficulty, weakness, light-headedness, numbness and headaches.  Hematological: Negative for adenopathy. Does not bruise/bleed easily.  Psychiatric/Behavioral: Negative for agitation, behavioral problems, confusion, decreased concentration, dysphoric mood, hallucinations, self-injury, sleep disturbance and suicidal ideas. The patient is nervous/anxious. The patient is not hyperactive.     Past Medical History:  Diagnosis Date  . Anxiety   . Arthritis    DDD lumbar.  . Benign skin lesion of nose   . Depression   . Erectile dysfunction    Due to decreased testosterone   . GERD (gastroesophageal reflux disease)    NO TROUBLE IN YEARS  . History of colon polyps   . Hyperlipidemia   . Hypogonadism male   . Increased glucose level   . Increased serum lipids   . Insomnia   . Low back pain   . Low testosterone   . Previous back surgery    Past Surgical History:  Procedure Laterality Date  . BREAST MASS EXCISION     RIGHT CHEST  BENIGN  . COLONOSCOPY W/ POLYPECTOMY  09/2013   One polyp, came back pre-cancerous. Repeat 5 years unless symptoms present.   .[facility]YE SURGERY  02/28/2000   Lasik  . MASS EXCISION     RIGHT EAR   BENIGN  . SPINE SURGERY  09/26/2012   [facility] [place]  . TRANSFORAMINAL LUMBAR INTERBODY  FUSION (TLIF) WITH PEDICLE SCREW FIXATION 1 LEVEL Right 04/22/2013   Procedure: TRANSFORAMINAL LUMBAR INTERBODY FUSION (TLIF)  WITH PEDICLE SCREW FIXATION LUMBAR FIVE SACRAL ONE;  Surgeon: Faythe Ghee, MD;  Location: [facility];  Service: Neurosurgery;  Laterality: Right;   No Known Allergies Current Outpatient Prescriptions  Medication Sig Dispense Refill  . acetaminophen (TYLENOL) 325 MG tablet Take 650 mg by mouth every 6 (six) hours as needed for mild pain.    . hydrOXYzine (ATARAX/VISTARIL) 25 MG tablet Take 1-2 tablets (25-50 mg total) by mouth 3 (three) times daily as needed for anxiety. (Patient taking differently: Take 50 mg by mouth 3 (three) times daily as needed for anxiety. ) 180 tablet 5  . pravastatin (PRAVACHOL) 20 MG tablet Take 2 tablets (40 mg total) by mouth daily. 60 tablet 11  . testosterone cypionate (DEPOTESTOSTERONE CYPIONATE) 200 MG/ML injection INJECT 1 ML IN THE MUSCLE EVERY 14 DAYS 3 mL 5  . traZODone (DESYREL) 50 MG tablet Take 1-2 tablets (50-100 mg total) by mouth at bedtime. 60 tablet 5  . VANISHPOINT SAFETY SYRINGE 22G X 1-1/2" 3 ML MISC USE AS DIRECTED TO SELF-ADMINISTER TESTOSTERONE 50 each 0  . diazepam (VALIUM) 5 MG tablet Take 1 tablet (5 mg total) by mouth every 6 (six) hours as needed for muscle spasms. 30 tablet 0  . docusate sodium (COLACE) 100 MG capsule Take 1 capsule (100 mg total) by mouth 2 (two) times daily. 30 capsule 0  . DULoxetine (CYMBALTA) 60 MG capsule Take 1 capsule (60 mg total) by mouth daily. 30 capsule 5  . LORazepam (ATIVAN) 1 MG tablet Take 0.5-1 tablets (0.5-1 mg total) by mouth every 8 (eight) hours as needed for anxiety. for anxiety (Patient not taking: Reported on 09/06/2015) 60 tablet 0  . oxyCODONE (ROXICODONE) 15 MG immediate release tablet Take 1 tablet (15 mg total) by mouth every 4 (four) hours as needed for pain. 30 tablet 0  . oxyCODONE-acetaminophen (PERCOCET/ROXICET) 5-325 MG tablet Take 1-2 tablets by mouth every 4 (four) hours as needed for moderate pain. 30 tablet 0   No current facility-administered medications for this visit.    Social  History   Social History  . Marital status: Married    Spouse name: N/A  . Number of children: N/A  . Years of education: N/A   Occupational History  . Not on file.   Social History Main Topics  . Smoking status: Never Smoker  . Smokeless tobacco: Never Used  . Alcohol use No  . Drug use: No  . Sexual activity: Yes     Comment: number of sex partners in the last 12 months  1   Other Topics Concern  . Not on file   Social History Narrative   Marital status:  Married x 30 years; happily married; second marriage; first wife died AMI age 30.        Children:  1 child; 3 stepchildren.  Several grandchildren.      Lives: with wife.      Employment:  [employer]; Print production planner x 18 years; happy.  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        Tobacco: never       Alcohol:  None      Drugs: none      Exercise:  Walking twice daily 2-3 miles total each day.  Job physically demanding.        Seatbelt: 100%; no texting  Guns: yes; unloaded; in safe.      Sunscreen: SPF on face.   Family History  Problem Relation Age of Onset  . Heart disease Maternal Grandmother   . Hypertension Maternal Grandmother   . Stroke Maternal Grandfather   . Anxiety disorder Mother   . Cancer Paternal Grandfather     prostate  . Anxiety disorder Brother   . Stroke Paternal Grandmother   . Colon cancer Paternal Aunt 60       Objective:    BP 120/80   Pulse 82   Temp 98.3 F (36.8 C) (Oral)   Resp 18   Ht 6' 1"  (1.854 m)   Wt 211 lb (95.7 kg)   SpO2 97%   BMI 27.84 kg/m  Physical Exam  Constitutional: He is oriented to person, place, and time. He appears well-developed and well-nourished. No distress.  HENT:  Head: Normocephalic and atraumatic.  Right Ear: External ear normal.  Left Ear: External ear normal.  Nose: Nose normal.  Mouth/Throat: Oropharynx is clear and moist.  Eyes: Conjunctivae and EOM are normal. Pupils are equal, round, and reactive to light.  Neck: Normal  range of motion. Neck supple. Carotid bruit is not present. No thyromegaly present.  Cardiovascular: Normal rate, regular rhythm, normal heart sounds and intact distal pulses.  Exam reveals no gallop and no friction rub.   No murmur heard. Pulmonary/Chest: Effort normal and breath sounds normal. He has no wheezes. He has no rales.  Abdominal: Soft. Bowel sounds are normal. He exhibits no distension and no mass. There is no tenderness. There is no rebound and no guarding.  Genitourinary: Prostate normal and penis normal.  Musculoskeletal:       Right shoulder: Normal.       Left shoulder: Normal.       Cervical back: Normal.  Lymphadenopathy:    He has no cervical adenopathy.  Neurological: He is alert and oriented to person, place, and time. He has normal reflexes. No cranial nerve deficit. He exhibits normal muscle tone. Coordination normal.  Skin: Skin is warm and dry. No rash noted. He is not diaphoretic.  Psychiatric: He has a normal mood and affect. His behavior is normal. Judgment and thought content normal.   Results for orders placed or performed in visit on 09/14/15  CBC with Differential/Platelet  Result Value Ref Range   WBC 5.5 3.8 - 10.8 K/uL   RBC 5.51 4.20 - 5.80 MIL/uL   Hemoglobin 17.0 13.2 - 17.1 g/dL   HCT 48.2 38.5 - 50.0 %   MCV 87.5 80.0 - 100.0 fL   MCH 30.9 27.0 - 33.0 pg   MCHC 35.3 32.0 - 36.0 g/dL   RDW 13.1 11.0 - 15.0 %   Platelets 204 140 - 400 K/uL   MPV 9.7 7.5 - 12.5 fL   Neutro Abs 3,300 1,500 - 7,800 cells/uL   Lymphs Abs 1,705 850 - 3,900 cells/uL   Monocytes Absolute 385 200 - 950 cells/uL   Eosinophils Absolute 110 15 - 500 cells/uL   Basophils Absolute 0 0 - 200 cells/uL   Neutrophils Relative % 60 %   Lymphocytes Relative 31 %   Monocytes Relative 7 %   Eosinophils Relative 2 %   Basophils Relative 0 %   Smear Review Criteria for review not met   Comprehensive metabolic panel  Result Value Ref Range   Sodium 138 135 - 146 mmol/L    Potassium 3.8 3.5 - 5.3 mmol/L   Chloride 100  98 - 110 mmol/L   CO2 27 20 - 31 mmol/L   Glucose, Bld 96 65 - 99 mg/dL   BUN 11 7 - 25 mg/dL   Creat 1.37 (H) 0.70 - 1.33 mg/dL   Total Bilirubin 1.1 0.2 - 1.2 mg/dL   Alkaline Phosphatase 54 40 - 115 U/L   AST 28 10 - 35 U/L   ALT 32 9 - 46 U/L   Total Protein 7.1 6.1 - 8.1 g/dL   Albumin 4.5 3.6 - 5.1 g/dL   Calcium 9.8 8.6 - 10.3 mg/dL  Hemoglobin A1c  Result Value Ref Range   Hgb A1c MFr Bld 5.3 <5.7 %   Mean Plasma Glucose 105 mg/dL  Lipid panel  Result Value Ref Range   Cholesterol 186 125 - 200 mg/dL   Triglycerides 165 (H) <150 mg/dL   HDL 75 >=40 mg/dL   Total CHOL/HDL Ratio 2.5 <=5.0 Ratio   VLDL 33 (H) <30 mg/dL   LDL Cholesterol 78 <130 mg/dL  TSH  Result Value Ref Range   TSH 2.16 0.40 - 4.50 mIU/L  PSA  Result Value Ref Range   PSA 0.92 <=4.00 ng/mL  Testosterone  Result Value Ref Range   Testosterone 1,147 (H) 250 - 827 ng/dL  POCT urinalysis dipstick  Result Value Ref Range   Color, UA yellow yellow   Clarity, UA clear clear   Glucose, UA negative negative   Bilirubin, UA negative negative   Ketones, POC UA negative negative   Spec Grav, UA 1.015    Blood, UA negative negative   pH, UA 8.5    Protein Ur, POC negative negative   Urobilinogen, UA 0.2    Nitrite, UA Negative Negative   Leukocytes, UA Negative Negative       Assessment & Plan:   1. Routine physical examination   2. Lumbar disc herniation with radiculopathy   3. Gastroesophageal reflux disease without esophagitis   4. Testosterone deficiency   5. Anxiety   6. Increased glucose level   7. Increased serum lipids   8. History of colon polyps   9. Corporo-venous occlusive erectile dysfunction     Orders Placed This Encounter  Procedures  . CBC with Differential/Platelet  . Comprehensive metabolic panel    Order Specific Question:   Has the patient fasted?    Answer:   Yes  . Hemoglobin A1c  . Lipid panel    Order Specific  Question:   Has the patient fasted?    Answer:   Yes  . TSH  . PSA  . Testosterone  . POCT urinalysis dipstick  . EKG 12-Lead   Meds ordered this encounter  Medications  . DISCONTD: DULoxetine (CYMBALTA) 20 MG capsule    Sig: Take 1 capsule (20 mg total) by mouth daily.    Dispense:  30 capsule    Refill:  5  . hydrOXYzine (ATARAX/VISTARIL) 25 MG tablet    Sig: Take 1-2 tablets (25-50 mg total) by mouth 3 (three) times daily as needed for anxiety.    Dispense:  180 tablet    Refill:  5  . pravastatin (PRAVACHOL) 20 MG tablet    Sig: Take 2 tablets (40 mg total) by mouth daily.    Dispense:  60 tablet    Refill:  11  . traZODone (DESYREL) 50 MG tablet    Sig: Take 1-2 tablets (50-100 mg total) by mouth at bedtime.    Dispense:  60 tablet    Refill:  5  No Follow-up on file.    Kristi Elayne Guerin, M.D. [facility] [address] [place], [place]  [address] [phone] phone [phone] fax

## 2015-09-14 NOTE — Patient Instructions (Addendum)
   IF you received an x-ray today, you will receive an invoice from [facility]. Please contact [facility] at [phone] with questions or concerns regarding your invoice.   IF you received labwork today, you will receive an invoice from [facility]. Please contact [facility] at [phone] with questions or concerns regarding your invoice.   Our billing staff will not be able to assist you with questions regarding bills from these companies.  You will be contacted with the lab results as soon as they are available. The fastest way to get your results is to activate your My Chart account. Instructions are located on the last page of this paperwork. If you have not heard from us regarding the results in 2 weeks, please contact this office.    Keeping you healthy  Get these tests  Blood pressure- Have your blood pressure checked once a year by your healthcare provider.  Normal blood pressure is 120/80  Weight- Have your body mass index (BMI) calculated to screen for obesity.  BMI is a measure of body fat based on height and weight. You can also calculate your own BMI at [email or web address].com/bmi/.  Cholesterol- Have your cholesterol checked every year.  Diabetes- Have your blood sugar checked regularly if you have high blood pressure, high cholesterol, have a family history of diabetes or if you are overweight.  Screening for Colon Cancer- Colonoscopy starting at age 50.  Screening may begin sooner depending on your family history and other health conditions. Follow up colonoscopy as directed by your Gastroenterologist.  Screening for Prostate Cancer- Both blood work (PSA) and a rectal exam help screen for Prostate Cancer.  Screening begins at age 40 with African-American men and at age 50 with Caucasian men.  Screening may begin sooner depending on your family history.  Take these medicines  Aspirin- One aspirin daily can help prevent Heart  disease and Stroke.  Flu shot- Every fall.  Tetanus- Every 10 years.  Zostavax- Once after the age of 60 to prevent Shingles.  Pneumonia shot- Once after the age of 65; if you are younger than 65, ask your healthcare provider if you need a Pneumonia shot.  Take these steps  Don't smoke- If you do smoke, talk to your doctor about quitting.  For tips on how to quit, go to www.smokefree.gov or call 1-800-QUIT-NOW.  Be physically active- Exercise 5 days a week for at least 30 minutes.  If you are not already physically active start slow and gradually work up to 30 minutes of moderate physical activity.  Examples of moderate activity include walking briskly, mowing the yard, dancing, swimming, bicycling, etc.  Eat a healthy diet- Eat a variety of healthy food such as fruits, vegetables, low fat milk, low fat cheese, yogurt, lean meant, poultry, fish, beans, tofu, etc. For more information go to www.thenutritionsource.org  Drink alcohol in moderation- Limit alcohol intake to less than two drinks a day. Never drink and drive.  Dentist- Brush and floss twice daily; visit your dentist twice a year.  Depression- Your emotional health is as important as your physical health. If you're feeling down, or losing interest in things you would normally enjoy please talk to your healthcare provider.  Eye exam- Visit your eye doctor every year.  Safe sex- If you may be exposed to a sexually transmitted infection, use a condom.  Seat belts- Seat belts can save your life; always wear one.  Smoke/Carbon Monoxide detectors- These detectors need to be installed on   the appropriate level of your home.  Replace batteries at least once a year.  Skin cancer- When out in the sun, cover up and use sunscreen 15 SPF or higher.  Violence- If anyone is threatening you, please tell your healthcare provider.  Living Will/ Health care power of attorney- Speak with your healthcare provider and family. 

## 2015-09-15 LAB — HEMOGLOBIN A1C
Hgb A1c MFr Bld: 5.3 % (ref <5.7)
Mean Plasma Glucose: 105 mg/dL

## 2015-09-15 LAB — PSA: PSA: 0.92 ng/mL (ref <=4.00)

## 2015-09-16 ENCOUNTER — Inpatient Hospital Stay (HOSPITAL_COMMUNITY): Payer: 59 | Admitting: Anesthesiology

## 2015-09-16 ENCOUNTER — Inpatient Hospital Stay (HOSPITAL_COMMUNITY)
Admission: RE | Admit: 2015-09-16 | Discharge: 2015-09-19 | DRG: 460 | Disposition: A | Discharge status: Home / Self Care | Payer: 59 | Source: Ambulatory Visit | Attending: Neurosurgery | Admitting: Neurosurgery

## 2015-09-16 ENCOUNTER — Encounter (HOSPITAL_COMMUNITY)
Admission: RE | Disposition: A | Discharge status: Home / Self Care | Payer: Self-pay | Source: Ambulatory Visit | Attending: Neurosurgery

## 2015-09-16 ENCOUNTER — Inpatient Hospital Stay (HOSPITAL_COMMUNITY): Payer: 59

## 2015-09-16 ENCOUNTER — Encounter (HOSPITAL_COMMUNITY): Payer: Self-pay | Admitting: General Practice

## 2015-09-16 DIAGNOSIS — Z981 Arthrodesis status: Secondary | ICD-10-CM | POA: Diagnosis not present

## 2015-09-16 DIAGNOSIS — M5116 Intervertebral disc disorders with radiculopathy, lumbar region: Secondary | ICD-10-CM | POA: Diagnosis present

## 2015-09-16 DIAGNOSIS — M4806 Spinal stenosis, lumbar region: Secondary | ICD-10-CM | POA: Diagnosis present

## 2015-09-16 DIAGNOSIS — E785 Hyperlipidemia, unspecified: Secondary | ICD-10-CM | POA: Diagnosis present

## 2015-09-16 DIAGNOSIS — Z419 Encounter for procedure for purposes other than remedying health state, unspecified: Secondary | ICD-10-CM

## 2015-09-16 DIAGNOSIS — M549 Dorsalgia, unspecified: Secondary | ICD-10-CM | POA: Diagnosis present

## 2015-09-16 DIAGNOSIS — M48061 Spinal stenosis, lumbar region without neurogenic claudication: Secondary | ICD-10-CM | POA: Diagnosis present

## 2015-09-16 SURGERY — POSTERIOR LUMBAR FUSION 1 LEVEL
Anesthesia: General

## 2015-09-16 MED ORDER — MIDAZOLAM HCL 2 MG/2ML IJ SOLN
INTRAMUSCULAR | Status: AC
  Filled 2015-09-16: qty 2

## 2015-09-16 MED ORDER — 0.9 % SODIUM CHLORIDE (POUR BTL) OPTIME
TOPICAL | Status: DC | PRN
  Administered 2015-09-16: 1000 mL

## 2015-09-16 MED ORDER — PRAVASTATIN SODIUM 40 MG PO TABS
40.0000 mg | ORAL_TABLET | Freq: Every day | ORAL | Status: DC
  Filled 2015-09-16: qty 1

## 2015-09-16 MED ORDER — HYDROMORPHONE HCL 1 MG/ML IJ SOLN
INTRAMUSCULAR | Status: AC
  Administered 2015-09-16: 0.5 mg via INTRAVENOUS
  Filled 2015-09-16: qty 1

## 2015-09-16 MED ORDER — CEFAZOLIN SODIUM-DEXTROSE 2-4 GM/100ML-% IV SOLN
2.0000 g | INTRAVENOUS | Status: AC
Start: 2015-09-16 — End: 2015-09-16
  Administered 2015-09-16: 2 g via INTRAVENOUS
  Filled 2015-09-16: qty 100

## 2015-09-16 MED ORDER — SODIUM CHLORIDE 0.9 % IR SOLN
Status: DC | PRN
  Administered 2015-09-16: 13:00:00

## 2015-09-16 MED ORDER — ROCURONIUM BROMIDE 100 MG/10ML IV SOLN
INTRAVENOUS | Status: DC | PRN
  Administered 2015-09-16 (×2): 50 mg via INTRAVENOUS

## 2015-09-16 MED ORDER — FENTANYL CITRATE (PF) 250 MCG/5ML IJ SOLN
INTRAMUSCULAR | Status: DC | PRN
  Administered 2015-09-16 (×5): 50 ug via INTRAVENOUS
  Administered 2015-09-16: 100 ug via INTRAVENOUS
  Administered 2015-09-16: 150 ug via INTRAVENOUS

## 2015-09-16 MED ORDER — BUPIVACAINE-EPINEPHRINE (PF) 0.5% -1:200000 IJ SOLN
INTRAMUSCULAR | Status: DC | PRN
  Administered 2015-09-16: 10 mL via PERINEURAL

## 2015-09-16 MED ORDER — FENTANYL CITRATE (PF) 250 MCG/5ML IJ SOLN
INTRAMUSCULAR | Status: AC
  Filled 2015-09-16: qty 5

## 2015-09-16 MED ORDER — KETOROLAC TROMETHAMINE 30 MG/ML IJ SOLN
INTRAMUSCULAR | Status: AC
  Filled 2015-09-16: qty 1

## 2015-09-16 MED ORDER — LIDOCAINE 2% (20 MG/ML) 5 ML SYRINGE
INTRAMUSCULAR | Status: AC
  Filled 2015-09-16: qty 5

## 2015-09-16 MED ORDER — ACETAMINOPHEN 650 MG RE SUPP: 650.0000 mg | RECTAL | Status: DC | PRN

## 2015-09-16 MED ORDER — SUGAMMADEX SODIUM 200 MG/2ML IV SOLN
INTRAVENOUS | Status: DC | PRN
  Administered 2015-09-16: 200 mg via INTRAVENOUS

## 2015-09-16 MED ORDER — BUPIVACAINE LIPOSOME 1.3 % IJ SUSP
INTRAMUSCULAR | Status: DC | PRN
  Administered 2015-09-16: 20 mL

## 2015-09-16 MED ORDER — MIDAZOLAM HCL 5 MG/5ML IJ SOLN
INTRAMUSCULAR | Status: DC | PRN
  Administered 2015-09-16: 2 mg via INTRAVENOUS

## 2015-09-16 MED ORDER — OXYCODONE-ACETAMINOPHEN 5-325 MG PO TABS
1.0000 | ORAL_TABLET | ORAL | Status: DC | PRN
  Administered 2015-09-16 – 2015-09-17 (×2): 2 via ORAL
  Administered 2015-09-17: 1 via ORAL
  Filled 2015-09-16 (×2): qty 2
  Filled 2015-09-16: qty 1

## 2015-09-16 MED ORDER — LACTATED RINGERS IV SOLN
INTRAVENOUS | Status: DC
Start: 2015-09-16 — End: 2015-09-19
  Administered 2015-09-16 (×4): via INTRAVENOUS

## 2015-09-16 MED ORDER — OXYCODONE HCL 5 MG PO TABS
15.0000 mg | ORAL_TABLET | ORAL | Status: DC | PRN
  Administered 2015-09-16 – 2015-09-19 (×15): 15 mg via ORAL
  Filled 2015-09-16 (×14): qty 3

## 2015-09-16 MED ORDER — BUPIVACAINE LIPOSOME 1.3 % IJ SUSP
20.0000 mL | Freq: Once | INTRAMUSCULAR | Status: DC
  Filled 2015-09-16: qty 20

## 2015-09-16 MED ORDER — ACETAMINOPHEN 325 MG PO TABS: 650.0000 mg | ORAL_TABLET | ORAL | Status: DC | PRN

## 2015-09-16 MED ORDER — DULOXETINE HCL 20 MG PO CPEP
20.0000 mg | ORAL_CAPSULE | Freq: Every day | ORAL | Status: DC
  Administered 2015-09-16 – 2015-09-19 (×4): 20 mg via ORAL
  Filled 2015-09-16 (×4): qty 1

## 2015-09-16 MED ORDER — VANCOMYCIN HCL 1000 MG IV SOLR
INTRAVENOUS | Status: DC | PRN
  Administered 2015-09-16: 1000 mg

## 2015-09-16 MED ORDER — PROPOFOL 10 MG/ML IV BOLUS
INTRAVENOUS | Status: DC | PRN
  Administered 2015-09-16: 150 mg via INTRAVENOUS
  Administered 2015-09-16: 50 mg via INTRAVENOUS

## 2015-09-16 MED ORDER — ALUM & MAG HYDROXIDE-SIMETH 200-200-20 MG/5ML PO SUSP: 30.0000 mL | Freq: Four times a day (QID) | ORAL | Status: DC | PRN

## 2015-09-16 MED ORDER — ROCURONIUM BROMIDE 50 MG/5ML IV SOLN
INTRAVENOUS | Status: AC
  Filled 2015-09-16: qty 1

## 2015-09-16 MED ORDER — HYDROMORPHONE HCL 1 MG/ML IJ SOLN
0.2500 mg | INTRAMUSCULAR | Status: DC | PRN
  Administered 2015-09-16 (×4): 0.5 mg via INTRAVENOUS

## 2015-09-16 MED ORDER — PROPOFOL 10 MG/ML IV BOLUS
INTRAVENOUS | Status: AC
  Filled 2015-09-16: qty 20

## 2015-09-16 MED ORDER — HYDROXYZINE HCL 50 MG PO TABS
50.0000 mg | ORAL_TABLET | Freq: Three times a day (TID) | ORAL | Status: DC | PRN
  Administered 2015-09-17 (×2): 50 mg via ORAL
  Filled 2015-09-16 (×5): qty 1

## 2015-09-16 MED ORDER — ONDANSETRON HCL 4 MG/2ML IJ SOLN: 4.0000 mg | INTRAMUSCULAR | Status: DC | PRN

## 2015-09-16 MED ORDER — DIAZEPAM 5 MG PO TABS
ORAL_TABLET | ORAL | Status: AC
  Filled 2015-09-16: qty 1

## 2015-09-16 MED ORDER — OXYCODONE HCL 5 MG PO TABS
ORAL_TABLET | ORAL | Status: AC
  Filled 2015-09-16: qty 3

## 2015-09-16 MED ORDER — PROMETHAZINE HCL 25 MG/ML IJ SOLN: 6.2500 mg | INTRAMUSCULAR | Status: DC | PRN

## 2015-09-16 MED ORDER — ONDANSETRON HCL 4 MG/2ML IJ SOLN
INTRAMUSCULAR | Status: AC
  Filled 2015-09-16: qty 2

## 2015-09-16 MED ORDER — DEXAMETHASONE SODIUM PHOSPHATE 4 MG/ML IJ SOLN
INTRAMUSCULAR | Status: DC | PRN
  Administered 2015-09-16: 10 mg via INTRAVENOUS

## 2015-09-16 MED ORDER — MORPHINE SULFATE (PF) 4 MG/ML IV SOLN
INTRAVENOUS | Status: AC
  Filled 2015-09-16: qty 1

## 2015-09-16 MED ORDER — BISACODYL 10 MG RE SUPP: 10.0000 mg | Freq: Every day | RECTAL | Status: DC | PRN

## 2015-09-16 MED ORDER — CEFAZOLIN SODIUM-DEXTROSE 2-4 GM/100ML-% IV SOLN
2.0000 g | Freq: Three times a day (TID) | INTRAVENOUS | Status: AC
  Administered 2015-09-16 – 2015-09-17 (×2): 2 g via INTRAVENOUS
  Filled 2015-09-16 (×2): qty 100

## 2015-09-16 MED ORDER — MENTHOL 3 MG MT LOZG: 1.0000 | LOZENGE | OROMUCOSAL | Status: DC | PRN

## 2015-09-16 MED ORDER — THROMBIN 20000 UNITS EX SOLR
CUTANEOUS | Status: DC | PRN
  Administered 2015-09-16: 13:00:00 via TOPICAL

## 2015-09-16 MED ORDER — PHENOL 1.4 % MT LIQD: 1.0000 | OROMUCOSAL | Status: DC | PRN

## 2015-09-16 MED ORDER — VANCOMYCIN HCL 1000 MG IV SOLR
INTRAVENOUS | Status: AC
  Filled 2015-09-16: qty 1000

## 2015-09-16 MED ORDER — TRAZODONE HCL 50 MG PO TABS
50.0000 mg | ORAL_TABLET | Freq: Every day | ORAL | Status: DC
  Administered 2015-09-16: 100 mg via ORAL
  Administered 2015-09-17: 50 mg via ORAL
  Administered 2015-09-18: 100 mg via ORAL
  Filled 2015-09-16 (×3): qty 2

## 2015-09-16 MED ORDER — MORPHINE SULFATE (PF) 2 MG/ML IV SOLN
1.0000 mg | INTRAVENOUS | Status: DC | PRN
  Administered 2015-09-16: 4 mg via INTRAVENOUS
  Administered 2015-09-17: 2 mg via INTRAVENOUS
  Administered 2015-09-17 – 2015-09-18 (×2): 4 mg via INTRAVENOUS
  Filled 2015-09-16 (×2): qty 2
  Filled 2015-09-16: qty 1

## 2015-09-16 MED ORDER — HYDROCODONE-ACETAMINOPHEN 5-325 MG PO TABS: 1.0000 | ORAL_TABLET | ORAL | Status: DC | PRN

## 2015-09-16 MED ORDER — DOCUSATE SODIUM 100 MG PO CAPS
100.0000 mg | ORAL_CAPSULE | Freq: Two times a day (BID) | ORAL | Status: DC
  Administered 2015-09-16 – 2015-09-19 (×6): 100 mg via ORAL
  Filled 2015-09-16 (×6): qty 1

## 2015-09-16 MED ORDER — LACTATED RINGERS IV SOLN: INTRAVENOUS | Status: DC

## 2015-09-16 MED ORDER — ONDANSETRON HCL 4 MG/2ML IJ SOLN
INTRAMUSCULAR | Status: DC | PRN
  Administered 2015-09-16: 4 mg via INTRAVENOUS

## 2015-09-16 MED ORDER — LIDOCAINE HCL (CARDIAC) 20 MG/ML IV SOLN
INTRAVENOUS | Status: DC | PRN
  Administered 2015-09-16: 100 mg via INTRAVENOUS

## 2015-09-16 MED ORDER — DIAZEPAM 5 MG PO TABS
5.0000 mg | ORAL_TABLET | Freq: Four times a day (QID) | ORAL | Status: DC | PRN
  Administered 2015-09-16 – 2015-09-19 (×7): 5 mg via ORAL
  Filled 2015-09-16 (×6): qty 1

## 2015-09-16 SURGICAL SUPPLY — 62 items
BAG DECANTER FOR FLEXI CONT (MISCELLANEOUS) ×2 IMPLANT
BENZOIN TINCTURE PRP APPL 2/3 (GAUZE/BANDAGES/DRESSINGS) ×2 IMPLANT
BLADE CLIPPER SURG (BLADE) IMPLANT
BRUSH SCRUB EZ PLAIN DRY (MISCELLANEOUS) ×2 IMPLANT
BUR MATCHSTICK NEURO 3.0 LAGG (BURR) ×2 IMPLANT
BUR PRECISION FLUTE 6.0 (BURR) ×2 IMPLANT
CAGE ALTERA 10X31X10-14 15D (Cage) ×2 IMPLANT
CANISTER SUCT 3000ML PPV (MISCELLANEOUS) ×2 IMPLANT
CONT SPEC 4OZ CLIKSEAL STRL BL (MISCELLANEOUS) ×2 IMPLANT
COVER BACK TABLE 60X90IN (DRAPES) ×2 IMPLANT
DRAPE C-ARM 42X72 X-RAY (DRAPES) ×4 IMPLANT
DRAPE LAPAROTOMY 100X72X124 (DRAPES) ×2 IMPLANT
DRAPE POUCH INSTRU U-SHP 10X18 (DRAPES) ×2 IMPLANT
DRAPE PROXIMA HALF (DRAPES) ×2 IMPLANT
DRAPE SURG 17X23 STRL (DRAPES) ×8 IMPLANT
ELECT BLADE 4.0 EZ CLEAN MEGAD (MISCELLANEOUS) ×2
ELECT REM PT RETURN 9FT ADLT (ELECTROSURGICAL) ×2
ELECTRODE BLDE 4.0 EZ CLN MEGD (MISCELLANEOUS) ×1 IMPLANT
ELECTRODE REM PT RTRN 9FT ADLT (ELECTROSURGICAL) ×1 IMPLANT
EVACUATOR 1/8 PVC DRAIN (DRAIN) IMPLANT
GAUZE SPONGE 4X4 12PLY STRL (GAUZE/BANDAGES/DRESSINGS) ×2 IMPLANT
GAUZE SPONGE 4X4 16PLY XRAY LF (GAUZE/BANDAGES/DRESSINGS) ×2 IMPLANT
GLOVE BIO SURGEON STRL SZ7 (GLOVE) ×8 IMPLANT
GLOVE BIO SURGEON STRL SZ8 (GLOVE) ×4 IMPLANT
GLOVE BIO SURGEON STRL SZ8.5 (GLOVE) ×4 IMPLANT
GLOVE BIOGEL PI IND STRL 7.5 (GLOVE) ×1 IMPLANT
GLOVE BIOGEL PI INDICATOR 7.5 (GLOVE) ×1
GLOVE EXAM NITRILE LRG STRL (GLOVE) IMPLANT
GLOVE EXAM NITRILE MD LF STRL (GLOVE) IMPLANT
GLOVE EXAM NITRILE XL STR (GLOVE) IMPLANT
GLOVE EXAM NITRILE XS STR PU (GLOVE) IMPLANT
GLOVE INDICATOR 7.5 STRL GRN (GLOVE) ×10 IMPLANT
GOWN STRL REUS W/ TWL LRG LVL3 (GOWN DISPOSABLE) IMPLANT
GOWN STRL REUS W/ TWL XL LVL3 (GOWN DISPOSABLE) ×2 IMPLANT
GOWN STRL REUS W/TWL 2XL LVL3 (GOWN DISPOSABLE) IMPLANT
GOWN STRL REUS W/TWL LRG LVL3 (GOWN DISPOSABLE)
GOWN STRL REUS W/TWL XL LVL3 (GOWN DISPOSABLE) ×2
KIT BASIN OR (CUSTOM PROCEDURE TRAY) ×2 IMPLANT
KIT ROOM TURNOVER OR (KITS) ×2 IMPLANT
NEEDLE HYPO 21X1.5 SAFETY (NEEDLE) IMPLANT
NEEDLE HYPO 22GX1.5 SAFETY (NEEDLE) ×2 IMPLANT
NS IRRIG 1000ML POUR BTL (IV SOLUTION) ×2 IMPLANT
PACK LAMINECTOMY NEURO (CUSTOM PROCEDURE TRAY) ×2 IMPLANT
PAD ARMBOARD 7.5X6 YLW CONV (MISCELLANEOUS) ×6 IMPLANT
PATTIES SURGICAL .5 X1 (DISPOSABLE) IMPLANT
ROD PREBENT PREC 65MM (Rod) ×4 IMPLANT
SCREW PATHFINDER 6.5X55MM (Screw) ×4 IMPLANT
SPONGE LAP 4X18 X RAY DECT (DISPOSABLE) IMPLANT
SPONGE NEURO XRAY DETECT 1X3 (DISPOSABLE) IMPLANT
SPONGE SURGIFOAM ABS GEL 100 (HEMOSTASIS) ×2 IMPLANT
STRIP BIOACTIVE 10CC 25X50X8 (Miscellaneous) ×2 IMPLANT
STRIP BIOACTIVE 20CC 25X100X8 (Miscellaneous) ×2 IMPLANT
STRIP CLOSURE SKIN 1/2X4 (GAUZE/BANDAGES/DRESSINGS) ×2 IMPLANT
SUT VIC AB 1 CT1 18XBRD ANBCTR (SUTURE) ×2 IMPLANT
SUT VIC AB 1 CT1 8-18 (SUTURE) ×2
SUT VIC AB 2-0 CP2 18 (SUTURE) ×4 IMPLANT
TAPE CLOTH SURG 4X10 WHT LF (GAUZE/BANDAGES/DRESSINGS) ×2 IMPLANT
TOP CLSR SEQUOIA (Orthopedic Implant) ×12 IMPLANT
TOWEL OR 17X24 6PK STRL BLUE (TOWEL DISPOSABLE) ×2 IMPLANT
TOWEL OR 17X26 10 PK STRL BLUE (TOWEL DISPOSABLE) ×2 IMPLANT
TRAY FOLEY W/METER SILVER 16FR (SET/KITS/TRAYS/PACK) ×2 IMPLANT
WATER STERILE IRR 1000ML POUR (IV SOLUTION) ×2 IMPLANT

## 2015-09-16 NOTE — Progress Notes (Signed)
Subjective:  The patient is alert and pleasant. He looks well.  Objective: Vital signs in last 24 hours: Temp:  [98.7 F (37.1 C)-98.9 F (37.2 C)] 98.7 F (37.1 C) (07/20 1656) Pulse Rate:  [75-102] 95 (07/20 1715) Resp:  [18] 18 (07/20 1700) BP: (117-145)/(85-93) 129/93 mmHg (07/20 1715) SpO2:  [95 %-100 %] 99 % (07/20 1715) Weight:  [95.709 kg (211 lb)] 95.709 kg (211 lb) (07/20 0820)  Intake/Output from previous day:   Intake/Output this shift: Total I/O In: 2000 [I.V.:2000] Out: 375 [Urine:250; Blood:125]  Physical exam the patient is alert and pleasant. He is moving his lower extremities well.  Lab Results:  Recent Labs  09/14/15 0926  WBC 5.5  HGB 17.0  HCT 48.2  PLT 204   BMET  Recent Labs  09/14/15 0926  NA 138  K 3.8  CL 100  CO2 27  GLUCOSE 96  BUN 11  CREATININE 1.37*  CALCIUM 9.8    Studies/Results: Dg Lumbar Spine 2-3 Views  09/16/2015  CLINICAL DATA:  Lumbar spine stenosis. EXAM: DG C-ARM 61-120 MIN; LUMBAR SPINE - 2-3 VIEW COMPARISON:  None. FINDINGS: Previous PLIF hardware seen at L5-S1. New pedicle screws are seen at level of L4 with new interbody fusionl cage at L4-5. IMPRESSION: New PLIF hardware at L4-5. Electronically Signed   By: Earle Gell M.D.   On: 09/16/2015 16:02   Dg Lumbar Spine 1 View  09/16/2015  CLINICAL DATA:  Lumbar spinal stenosis.  Elective surgery. EXAM: LUMBAR SPINE - 1 VIEW COMPARISON:  None. FINDINGS: Intraoperative cross-table lateral view shows previous PLIF hardware at L5-S1. A probe is seen overlying the posterior interspinous space at the level of L5-S1. IMPRESSION: Intraoperative localization of L5-S1. Electronically Signed   By: Earle Gell M.D.   On: 09/16/2015 15:59   Dg C-arm 1-60 Min  09/16/2015  CLINICAL DATA:  Lumbar spine stenosis. EXAM: DG C-ARM 61-120 MIN; LUMBAR SPINE - 2-3 VIEW COMPARISON:  None. FINDINGS: Previous PLIF hardware seen at L5-S1. New pedicle screws are seen at level of L4 with new  interbody fusionl cage at L4-5. IMPRESSION: New PLIF hardware at L4-5. Electronically Signed   By: Earle Gell M.D.   On: 09/16/2015 16:02    Assessment/Plan: The patient is doing well. I spoke with his wife. He will likely go home over the weekend.  LOS: 0 days     Jemmie Rhinehart D 09/16/2015, 5:20 PM

## 2015-09-16 NOTE — H&P (Cosign Needed)
Subjective: The patient is a 55 year old white male who has had a previous L5-S1 fusion by Dr. Hal Neer a couple years ago. He has developed recurrent back, buttock, and leg pain consistent with a lumbar radiculopathy. He has failed medical management and was worked up with a lumbar MRI. This demonstrated the patient had degenerative changes and stenosis at L4-5. I discussed the various treatment options. The patient has decided to proceed with a L4-5 decompression, instrumentation, and fusion.   Past Medical History  Diagnosis Date  . Hypogonadism male   . Hyperlipidemia   . Depression   . Insomnia   . Benign skin lesion of nose   . Increased serum lipids   . Low back pain   . Erectile dysfunction     Due to decreased testosterone   . Increased glucose level   . Low testosterone   . History of colon polyps   . Anxiety   . Arthritis     DDD lumbar.  . Previous back surgery   . GERD (gastroesophageal reflux disease)     NO TROUBLE IN YEARS    Past Surgical History  Procedure Laterality Date  . Eye surgery  02/28/2000    Lasik  . Colonoscopy w/ polypectomy  09/2013    One polyp, came back pre-cancerous. Repeat 5 years unless symptoms present.   [name] Spine surgery  09/26/2012    [facility] [place].  . Transforaminal lumbar interbody fusion (tlif) with pedicle screw fixation 1 level Right 04/22/2013    Procedure: TRANSFORAMINAL LUMBAR INTERBODY FUSION (TLIF) WITH PEDICLE SCREW FIXATION LUMBAR FIVE SACRAL ONE;  Surgeon: Faythe Ghee, MD;  Location: [facility];  Service: Neurosurgery;  Laterality: Right;  . Breast mass excision      RIGHT CHEST  BENIGN  . Mass excision      RIGHT EAR   BENIGN    No Known Allergies  Social History  Substance Use Topics  . Smoking status: Never Smoker   . Smokeless tobacco: Never Used  . Alcohol Use: No    Family History  Problem Relation Age of Onset  . Heart disease Maternal Grandmother   . Hypertension Maternal Grandmother    . Stroke Maternal Grandfather   . Anxiety disorder Mother   . Cancer Paternal Grandfather     prostate  . Anxiety disorder Brother   . Stroke Paternal Grandmother   . Colon cancer Paternal Aunt 38   Prior to Admission medications   Medication Sig Start Date End Date Taking? Authorizing Provider  acetaminophen (TYLENOL) 325 MG tablet Take 650 mg by mouth every 6 (six) hours as needed for mild pain.   Yes Historical Provider, MD  DULoxetine (CYMBALTA) 20 MG capsule Take 1 capsule (20 mg total) by mouth daily. 09/14/15  Yes Wardell Honour, MD  hydrOXYzine (ATARAX/VISTARIL) 25 MG tablet Take 1-2 tablets (25-50 mg total) by mouth 3 (three) times daily as needed for anxiety. Patient taking differently: Take 50 mg by mouth 3 (three) times daily as needed for anxiety.  09/14/15  Yes Wardell Honour, MD  oxyCODONE (ROXICODONE) 15 MG immediate release tablet Take 10 mg by mouth every 4 (four) hours as needed for pain.   Yes Historical Provider, MD  pravastatin (PRAVACHOL) 20 MG tablet Take 2 tablets (40 mg total) by mouth daily. 09/14/15  Yes Wardell Honour, MD  testosterone cypionate (DEPOTESTOSTERONE CYPIONATE) 200 MG/ML injection INJECT 1 ML IN THE MUSCLE EVERY 14 DAYS 09/01/15  Yes Steffanie Dunn  Koren Bound, MD  traZODone (DESYREL) 50 MG tablet Take 1-2 tablets (50-100 mg total) by mouth at bedtime. 09/14/15  Yes Wardell Honour, MD  VANISHPOINT SAFETY SYRINGE 22G X 1-1/2" 3 ML MISC USE AS DIRECTED TO SELF-ADMINISTER TESTOSTERONE 01/05/13  Yes Wardell Honour, MD  LORazepam (ATIVAN) 1 MG tablet Take 0.5-1 tablets (0.5-1 mg total) by mouth every 8 (eight) hours as needed for anxiety. for anxiety Patient not taking: Reported on 09/06/2015 08/10/15   Wardell Honour, MD     Review of Systems  Positive ROS: As above  All other systems have been reviewed and were otherwise negative with the exception of those mentioned in the HPI and as above.  Objective: Vital signs in last 24 hours: Temp:  [98.9 F (37.2 C)] 98.9  F (37.2 C) (07/20 0838) Pulse Rate:  [75] 75 (07/20 0821) BP: (117)/(85) 117/85 mmHg (07/20 0821) SpO2:  [95 %] 95 % (07/20 0821) Weight:  [95.709 kg (211 lb)] 95.709 kg (211 lb) (07/20 0820)  General Appearance: Alert, cooperative, no distress, Head: Normocephalic, without obvious abnormality, atraumatic Eyes: PERRL, conjunctiva/corneas clear, EOM's intact,    Ears: Normal  Throat: Normal  Neck: Supple, symmetrical, trachea midline, no adenopathy; thyroid: No enlargement/tenderness/nodules; no carotid bruit or JVD Back: Symmetric, no curvature, ROM normal, no CVA tenderness. The patient's lumbar incision is well-healed. Lungs: Clear to auscultation bilaterally, respirations unlabored Heart: Regular rate and rhythm, no murmur, rub or gallop Abdomen: Soft, non-tender,, no masses, no organomegaly Extremities: Extremities normal, atraumatic, no cyanosis or edema Pulses: 2+ and symmetric all extremities Skin: Skin color, texture, turgor normal, no rashes or lesions  NEUROLOGIC:   Mental status: alert and oriented, no aphasia, good attention span, Fund of knowledge/ memory ok Motor Exam - grossly normal Sensory Exam - grossly normal Reflexes:  Coordination - grossly normal Gait - grossly normal Balance - grossly normal Cranial Nerves: I: smell Not tested  II: visual acuity  OS: Normal  OD: Normal   II: visual fields Full to confrontation  II: pupils Equal, round, reactive to light  III,VII: ptosis None  III,IV,VI: extraocular muscles  Full ROM  V: mastication Normal  V: facial light touch sensation  Normal  V,VII: corneal reflex  Present  VII: facial muscle function - upper  Normal  VII: facial muscle function - lower Normal  VIII: hearing Not tested  IX: soft palate elevation  Normal  IX,X: gag reflex Present  XI: trapezius strength  5/5  XI: sternocleidomastoid strength 5/5  XI: neck flexion strength  5/5  XII: tongue strength  Normal    Data Review Lab Results   Component Value Date   WBC 5.5 09/14/2015   HGB 17.0 09/14/2015   HCT 48.2 09/14/2015   MCV 87.5 09/14/2015   PLT 204 09/14/2015   Lab Results  Component Value Date   NA 138 09/14/2015   K 3.8 09/14/2015   CL 100 09/14/2015   CO2 27 09/14/2015   BUN 11 09/14/2015   CREATININE 1.37* 09/14/2015   GLUCOSE 96 09/14/2015   No results found for: INR, PROTIME  Assessment/Plan: 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 I have reviewed his imaging studies with him and pointed out the abnormalities. We have discussed the various treatment options including surgery. I have described the surgical treatment option of an L4-5 decompression, instrumentation, and fusion with exploration was all fusion. I have shown him surgical models. We have discussed  the risks, benefits, alternatives, and likelihood of achieving our goals with surgery. I have answered all the patient's questions. He has decided to proceed with surgery.   Nikyla Navedo D 09/16/2015 12:59 PM

## 2015-09-16 NOTE — Op Note (Signed)
Brief history: The patient is a 55 year old white male who's had previous L5-S1 fusion by another physician. He has developed recurrent back, buttock, and leg pain consistent with lumbar radiculopathy. He has failed medical management and was worked up with a lumbar MRI and lumbar x-rays. This demonstrated L4-5 spinal stenosis distal degeneration, etc. I discussed the various treatment options with the patient including surgery. He has weighed the risks, benefits, and alternative surgery and decided proceed with an L4-5 decompression, instrumentation, and fusion.  Preoperative diagnosis: L4-5 Degenerative disc disease, spinal stenosis compressing both the L4 and the L5 nerve roots; lumbago; lumbar radiculopathy  Postoperative diagnosis: The same  Procedure: Bilateral L4-5 Laminotomy/foraminotomies to decompress the bilateral L4 and L5 nerve roots(the work required to do this was in addition to the work required to do the posterior lumbar interbody fusion because of the patient's spinal stenosis, facet arthropathy. Etc. requiring a wide decompression of the nerve roots.); L4-5 transforaminal lumbar interbody fusion with local morselized autograft bone and Kinnex graft extender; insertion of interbody prosthesis at L4-5 (globus peek expandable interbody prosthesis); posterior segmental instrumentation from L4 to S1 with Zimmer titanium pedicle screws and rods; posterior lateral arthrodesis at L4-5 with local morselized autograft bone and Kinnex bone graft extender: Consideration of lumbar fusion  Surgeon: Dr. Earle Gell  Asst.: Dr. Sherley Bounds  Anesthesia: Gen. endotracheal  Estimated blood loss: 250 mL  Drains: None  Complications: None  Description of procedure: The patient was brought to the operating room by the anesthesia team. General endotracheal anesthesia was induced. The patient was turned to the prone position on the Wilson frame. The patient's lumbosacral region was then prepared with  Betadine scrub and Betadine solution. Sterile drapes were applied.  I then injected the area to be incised with Marcaine with epinephrine solution. I then used the scalpel to make a linear midline incision over the L4-5 and L5-S1 interspace. I then used electrocautery to perform a bilateral subperiosteal dissection exposing the spinous process and lamina of L4, L5 and the upper sacrum as well as the old hardware bilaterally. We then obtained intraoperative radiograph to confirm our location. We then inserted the Verstrac retractor to provide exposure. I explored the fusion by removing the, removing the rods and inspecting the screws. The arthrodesis appears solid at L5-S1.  I began the decompression by using the high speed drill to perform laminotomies at L4-5 bilaterally. We then used the Kerrison punches to widen the laminotomy and removed the ligamentum flavum at L4-5 bilaterally. We used the Kerrison punches to remove the medial facets at L4-5 bilaterally. We performed wide foraminotomies about the bilateral L4 and L5 nerve roots completing the decompression.  We now turned our attention to the posterior lumbar interbody fusion. I used a scalpel to incise the intervertebral disc at L4-5 bilaterally. I then performed a partial intervertebral discectomy at L4-5 bilaterally using the pituitary forceps. We prepared the vertebral endplates at 075-GRM bilaterally for the fusion by removing the soft tissues with the curettes. We then used the trial spacers to pick the appropriate sized interbody prosthesis. We prefilled his prosthesis with a combination of local morselized autograft bone that we obtained during the decompression as well as Kinnex bone graft extender. We inserted the prefilled prosthesis into the interspace at L4-5, we then expanded the prosthesis. There was a good snug fit of the prosthesis in the interspace. We then filled and the remainder of the intervertebral disc space with local morselized  autograft bone and Kinnex. This completed  the posterior lumbar interbody arthrodesis.  We now turned attention to the instrumentation. Under fluoroscopic guidance we cannulated the bilateral L4 pedicles with the bone probe. We then removed the bone probe. We then tapped the pedicle with a 0.0 millimeter tap. We then removed the tap. We probed inside the tapped pedicle with a ball probe to rule out cortical breaches. We then inserted a 6.5 x 55 millimeter pedicle screw into the L4 pedicles bilaterally under fluoroscopic guidance. We then palpated along the medial aspect of the pedicles to rule out cortical breaches. There were none. The nerve roots were not injured. We then connected the unilateral pedicle screws from L4-S1 bilaterally with a lordotic rod. We compressed the construct and secured the rod in place with the caps. We then tightened the caps appropriately. This completed the instrumentation from L4-S1.  We now turned our attention to the posterior lateral arthrodesis at L4-5. We used the high-speed drill to decorticate the remainder of the facets, pars, transverse process at L4-5. We then applied a combination of local morselized autograft bone and Kinnex bone graft extender over these decorticated posterior lateral structures. This completed the posterior lateral arthrodesis.  We then obtained hemostasis using bipolar electrocautery. We irrigated the wound out with bacitracin solution. We inspected the thecal sac and nerve roots and noted they were well decompressed. We then removed the retractor. We placed vancomycin powder in the wound. We reapproximated patient's thoracolumbar fascia with interrupted #1 Vicryl suture. We reapproximated patient's subcutaneous tissue with interrupted 2-0 Vicryl suture. The reapproximated patient's skin with Steri-Strips and benzoin. The wound was then coated with bacitracin ointment. A sterile dressing was applied. The drapes were removed. The patient was  subsequently returned to the supine position where they were extubated by the anesthesia team. He was then transported to the post anesthesia care unit in stable condition. All sponge instrument and needle counts were reportedly correct at the end of this case.

## 2015-09-16 NOTE — Transfer of Care (Signed)
Immediate Anesthesia Transfer of Care Note  Patient: Curtis Yang  Procedure(s) Performed: Procedure(s): Lumbar four-five Exploration of fusion with posterior lumbar interbody fusion/interbody prosthesis/posterior segmental instrumentation/posterior lateral arthrodesis (N/A)  Patient Location: PACU  Anesthesia Type:General  Level of Consciousness: awake, alert  and oriented  Airway & Oxygen Therapy: Patient Spontanous Breathing and Patient connected to nasal cannula oxygen  Post-op Assessment: Report given to RN, Post -op Vital signs reviewed and stable and Patient moving all extremities X 4  Post vital signs: Reviewed and stable  Last Vitals:  Filed Vitals:   09/16/15 0821 09/16/15 0838  BP: 117/85   Pulse: 75   Temp:  37.2 C    Last Pain:  Filed Vitals:   09/16/15 0839  PainSc: 4       Patients Stated Pain Goal: 3 ([id number])  Complications: No apparent anesthesia complications

## 2015-09-16 NOTE — Progress Notes (Signed)
Patient arrived to 5C20 AAOx4 with 8/10 pain. He was able to eat a full meal with no issues. Patient given pain medication and is resting comfortably. Family at the bedside. Brandye Inthavong, Rande Brunt, RN

## 2015-09-16 NOTE — Anesthesia Preprocedure Evaluation (Signed)
Anesthesia Evaluation  Patient identified by MRN, date of birth, ID band Patient awake    Reviewed: Allergy & Precautions, H&P , NPO status , Patient's Chart, lab work & pertinent test results  History of Anesthesia Complications Negative for: history of anesthetic complications  Airway Mallampati: I  TM Distance: >3 FB Neck ROM: Full    Dental  (+) Teeth Intact, Dental Advisory Given   Pulmonary neg pulmonary ROS,    Pulmonary exam normal        Cardiovascular negative cardio ROS Normal cardiovascular exam     Neuro/Psych PSYCHIATRIC DISORDERS Anxiety Depression    GI/Hepatic negative GI ROS, Neg liver ROS,   Endo/Other  negative endocrine ROS  Renal/GU negative Renal ROS     Musculoskeletal   Abdominal   Peds  Hematology   Anesthesia Other Findings   Reproductive/Obstetrics                             Anesthesia Physical  Anesthesia Plan  ASA: II  Anesthesia Plan: General   Post-op Pain Management:    Induction: Intravenous  Airway Management Planned: Oral ETT  Additional Equipment:   Intra-op Plan:   Post-operative Plan: Extubation in OR  Informed Consent: I have reviewed the patients History and Physical, chart, labs and discussed the procedure including the risks, benefits and alternatives for the proposed anesthesia with the patient or authorized representative who has indicated his/her understanding and acceptance.   Dental advisory given  Plan Discussed with: CRNA, Anesthesiologist and Surgeon  Anesthesia Plan Comments:         Anesthesia Quick Evaluation

## 2015-09-16 NOTE — Anesthesia Procedure Notes (Signed)
Procedure Name: Intubation Date/Time: 09/16/2015 1:23 PM Performed by: Ollen Bowl Pre-anesthesia Checklist: Patient identified, Emergency Drugs available, Suction available, Patient being monitored and Timeout performed Patient Re-evaluated:Patient Re-evaluated prior to inductionOxygen Delivery Method: Circle system utilized and Simple face mask Preoxygenation: Pre-oxygenation with 100% oxygen Intubation Type: IV induction Ventilation: Mask ventilation without difficulty Laryngoscope Size: Miller and 3 Grade View: Grade I Tube type: Oral Tube size: 7.5 mm Number of attempts: 1 Airway Equipment and Method: Patient positioned with wedge pillow and Stylet Placement Confirmation: ETT inserted through vocal cords under direct vision,  positive ETCO2 and breath sounds checked- equal and bilateral Secured at: 23 cm Tube secured with: Tape Dental Injury: Teeth and Oropharynx as per pre-operative assessment

## 2015-09-17 LAB — BASIC METABOLIC PANEL
ANION GAP: 8 (ref 5–15)
BUN: 11 mg/dL (ref 6–20)
CHLORIDE: 104 mmol/L (ref 101–111)
CO2: 25 mmol/L (ref 22–32)
Calcium: 8.7 mg/dL — ABNORMAL LOW (ref 8.9–10.3)
Creatinine, Ser: 1.2 mg/dL (ref 0.61–1.24)
GFR calc non Af Amer: >60 mL/min (ref >60)
Glucose, Bld: 116 mg/dL — ABNORMAL HIGH (ref 65–99)
Potassium: 4.1 mmol/L (ref 3.5–5.1)
Sodium: 137 mmol/L (ref 135–145)

## 2015-09-17 LAB — CBC
HEMATOCRIT: 37.1 % — AB (ref 39.0–52.0)
HEMOGLOBIN: 13 g/dL (ref 13.0–17.0)
MCH: 30 pg (ref 26.0–34.0)
MCHC: 35 g/dL (ref 30.0–36.0)
MCV: 85.5 fL (ref 78.0–100.0)
Platelets: 182 10*3/uL (ref 150–400)
RBC: 4.34 MIL/uL (ref 4.22–5.81)
RDW: 12.2 % (ref 11.5–15.5)
WBC: 10.7 10*3/uL — AB (ref 4.0–10.5)

## 2015-09-17 MED ORDER — PRAVASTATIN SODIUM 40 MG PO TABS
40.0000 mg | ORAL_TABLET | Freq: Every day | ORAL | Status: DC
  Administered 2015-09-17 – 2015-09-18 (×2): 40 mg via ORAL
  Filled 2015-09-17 (×2): qty 1

## 2015-09-17 NOTE — Evaluation (Signed)
Physical Therapy Treatment Patient Details Name: Curtis Yang MRN: [id number] DOB: 03-May-1960 Today's Date: 09/17/2015    History of Present Illness Curtis Yang is a 55 year old male s/p L4-5 decompression, instrumentation, and fusion. PMH includes HLD, anxiety, depression, and previous fusion of L5-S1 two years ago.      PT Comments    Pt admitted s/p lumbar surgery. Pt was alert and aware of back precautions during activity. He was mod I for bed mobility due to increased time and effort. Pt ambulated 300 ft with supervision and RW. Will benefit from use of cane and stair training next session. He lives at home with his wife who will be there for the first week. He is able to live on the first floor for this time, and will go upstairs to shower at the end of the first week.  Pt will require education on body mechanics due to his job requirements of heavy lifting. Pt is cleared to ambulate with his wife and a RW at this time. Continue acute follow for mobility, stairs, and gait training.    Follow Up Recommendations  No PT follow up;Supervision for mobility/OOB     Equipment Recommendations  None recommended by PT    Recommendations for Other Services       Precautions / Restrictions Precautions Precautions: Back Precaution Booklet Issued: Yes (comment) Precaution Comments: Pt able to recall 2/3 precautions at beginning of session. Educated on 30 min time limit for sitting.  Required Braces or Orthoses: Spinal Brace Spinal Brace: Lumbar corset Restrictions Weight Bearing Restrictions: No    Mobility  Bed Mobility Overal bed mobility: Modified Independent             General bed mobility comments: Able to perform bed mobility while maintaining precautions with HOB and rails down. Requires extra time to do so.   Transfers Overall transfer level: Needs assistance Equipment used: Rolling walker (2 wheeled) Transfers: Sit to/from Stand Sit to Stand: Supervision          General transfer comment: Min verbal cues for placement of hands while powering up.   Ambulation/Gait Ambulation/Gait assistance: Supervision Ambulation Distance (Feet): 300 Feet Assistive device: Rolling walker (2 wheeled) Gait Pattern/deviations: Step-through pattern   Gait velocity interpretation: Below normal speed for age/gender General Gait Details: Pt will benefit from using a single point cane.   Stairs            Wheelchair Mobility    Modified Rankin (Stroke Patients Only)       Balance Overall balance assessment: Needs assistance Sitting-balance support: Feet supported;No upper extremity supported Sitting balance-Leahy Scale: Good Sitting balance - Comments: Able to don brace independently on EOB   Standing balance support: During functional activity;Bilateral upper extremity supported Standing balance-Leahy Scale: Good                      Cognition Arousal/Alertness: Awake/alert Behavior During Therapy: WFL for tasks assessed/performed Overall Cognitive Status: Within Functional Limits for tasks assessed                      Exercises      General Comments        Pertinent Vitals/Pain Pain Assessment: 0-10 Pain Score: 5  Pain Location: back, L hip Pain Descriptors / Indicators: Operative site guarding;Aching Pain Intervention(s): Premedicated before session;Monitored during session    Home Living Family/patient expects to be discharged to:: Private residence Living Arrangements: Spouse/significant other Available Help at Discharge:  Family Type of Home: House     Home Layout: Two level;1/2 bath on main level;Bed/bath upstairs Home Equipment: Cane - single point (Uses sink and cane to help with sit<> from toilet) Additional Comments: Pt is able to live on first floor for the first week with the help of his wife.     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 Requires heavy lifting and good mobility. Plans to return to work.    PT Goals (current goals can now be found in the care plan section) Acute Rehab PT Goals Patient Stated Goal: To walk with his wife. PT Goal Formulation: With patient/family Time For Goal Achievement: 09/24/15 Potential to Achieve Goals: Good    Frequency  Min 5X/week    PT Plan      Co-evaluation             End of Session Equipment Utilized During Treatment: Gait belt Activity Tolerance: Patient tolerated treatment well Patient left: in chair;with call bell/phone within reach;with family/visitor present     Time: 1107-1120 PT Time Calculation (min) (ACUTE ONLY): 13 min  Charges:    1 ACUTE Visit    1 PT Eval Low Complexity                     G CodesDaymon Yang Sep 19, 2015, 1:03 PM  Curtis Yang, SPT (student physical therapist) Curtis Yang [phone]

## 2015-09-17 NOTE — Progress Notes (Signed)
Patient ID: Curtis Yang, male   DOB: 10/24/1960, 55 y.o.   MRN: [id number] Doing well, back sore, no leg pain or NTW, MAE well, mobilize today with PT/OT, home tomorrow

## 2015-09-17 NOTE — Anesthesia Postprocedure Evaluation (Signed)
Anesthesia Post Note  Patient: Curtis Yang  Procedure(s) Performed: Procedure(s) (LRB): Lumbar four-five Exploration of fusion with posterior lumbar interbody fusion/interbody prosthesis/posterior segmental instrumentation/posterior lateral arthrodesis (N/A)  Patient location during evaluation: PACU Anesthesia Type: General Level of consciousness: sedated Pain management: pain level controlled Vital Signs Assessment: post-procedure vital signs reviewed and stable Respiratory status: spontaneous breathing and respiratory function stable Cardiovascular status: stable Anesthetic complications: no   Chigozie Basaldua DANIEL

## 2015-09-18 MED ORDER — KETOROLAC TROMETHAMINE 30 MG/ML IJ SOLN
30.0000 mg | Freq: Four times a day (QID) | INTRAMUSCULAR | Status: DC
  Administered 2015-09-18 – 2015-09-19 (×5): 30 mg via INTRAVENOUS
  Filled 2015-09-18 (×5): qty 1

## 2015-09-18 NOTE — Progress Notes (Signed)
PT Cancellation Note  Patient Details Name: Curtis Yang MRN: [id number] DOB: 1960-04-28   Cancelled Treatment:    Reason Eval/Treat Not Completed: Pain limiting ability to participatePt reported severe radical pain at this time and that he has ambulated with wife several times this am. PT will check on pt later as time allows.    Salina April, PTA Pager: [phone]   09/18/2015, 10:22 AM

## 2015-09-18 NOTE — Progress Notes (Signed)
Filed Vitals:   09/17/15 1922 09/17/15 2140 09/18/15 0140 09/18/15 0532  BP: 119/65 95/46 105/70 115/73  Pulse: 74 74 71 81  Temp: 98.6 F (37 C) 97.5 F (36.4 C) 98.5 F (36.9 C) 98.4 F (36.9 C)  TempSrc: Oral Oral Oral Oral  Resp: 20 20 18 18   Height:      Weight:      SpO2: 98% 96% 97% 97%    CBC  Recent Labs  09/17/15 0645  WBC 10.7*  HGB 13.0  HCT 37.1*  PLT 182   BMET  Recent Labs  09/17/15 0645  NA 137  K 4.1  CL 104  CO2 25  GLUCOSE 116*  BUN 11  CREATININE 1.20  CALCIUM 8.7*    Patient resting in bed. Did well yesterday, ambulating in the halls 6 times. However this morning with ambulation he had a lot more pain particularly right lumbar radicular pain. It was severe enough that his nurse gave him a dose of morphine IV. On exam his dressing is clean and dry.  Plan: The patient is wary to be discharged home because of the severity of the radicular pain this morning. I've gone ahead and ordered Toradol 30 mg IV now and every 6 hours. Hopefully the anti-inflammatory medication will decrease the nerve root irritation, and he'll be able to be able to transition home over the next day or 2. His questions and his wife's questions were answered for them.  Hosie Spangle, MD 09/18/2015, 9:27 AM

## 2015-09-19 MED ORDER — DOCUSATE SODIUM 100 MG PO CAPS: 100.0000 mg | ORAL_CAPSULE | Freq: Two times a day (BID) | ORAL | 0 refills | Status: DC

## 2015-09-19 MED ORDER — OXYCODONE HCL 15 MG PO TABS: 15.0000 mg | ORAL_TABLET | ORAL | 0 refills | Status: DC | PRN

## 2015-09-19 MED ORDER — DIAZEPAM 5 MG PO TABS: 5.0000 mg | ORAL_TABLET | Freq: Four times a day (QID) | ORAL | 0 refills | Status: DC | PRN

## 2015-09-19 MED ORDER — OXYCODONE-ACETAMINOPHEN 5-325 MG PO TABS: 1.0000 | ORAL_TABLET | ORAL | 0 refills | Status: DC | PRN

## 2015-09-19 NOTE — Discharge Summary (Signed)
Date of Admission: 09/16/2015  Date of Discharge: 09/19/15  Preoperative diagnosis: L4-5 Degenerative disc disease, spinal stenosis compressing both the L4 and the L5 nerve roots; lumbago; lumbar radiculopathy  Postoperative diagnosis: The same  Procedure: Bilateral L4-5 Laminotomy/foraminotomies to decompress the bilateral L4 and L5 nerve roots(the work required to do this was in addition to the work required to do the posterior lumbar interbody fusion because of the patient's spinal stenosis, facet arthropathy. Etc. requiring a wide decompression of the nerve roots.); L4-5 transforaminal lumbar interbody fusion with local morselized autograft bone and Kinnex graft extender; insertion of interbody prosthesis at L4-5 (globus peek expandable interbody prosthesis); posterior segmental instrumentation from L4 to S1 with Zimmer titanium pedicle screws and rods; posterior lateral arthrodesis at L4-5 with local morselized autograft bone and Kinnex bone graft extender: Consideration of lumbar fusion  Attending: Newman Pies, MD  Hospital Course:  The patient was admitted for the above listed operation and had an uncomplicated post-operative course.  They were discharged in stable condition.  Follow up: 3 weeks    Medication List    TAKE these medications   acetaminophen 325 MG tablet Commonly known as:  TYLENOL Take 650 mg by mouth every 6 (six) hours as needed for mild pain.   diazepam 5 MG tablet Commonly known as:  VALIUM Take 1 tablet (5 mg total) by mouth every 6 (six) hours as needed for muscle spasms.   docusate sodium 100 MG capsule Commonly known as:  COLACE Take 1 capsule (100 mg total) by mouth 2 (two) times daily.   DULoxetine 20 MG capsule Commonly known as:  CYMBALTA Take 1 capsule (20 mg total) by mouth daily.   hydrOXYzine 25 MG tablet Commonly known as:  ATARAX/VISTARIL Take 1-2 tablets (25-50 mg total) by mouth 3 (three) times daily as needed for anxiety. What  changed:  how much to take   LORazepam 1 MG tablet Commonly known as:  ATIVAN Take 0.5-1 tablets (0.5-1 mg total) by mouth every 8 (eight) hours as needed for anxiety. for anxiety   oxyCODONE 15 MG immediate release tablet Commonly known as:  ROXICODONE Take 1 tablet (15 mg total) by mouth every 4 (four) hours as needed for pain. What changed:  how much to take   oxyCODONE-acetaminophen 5-325 MG tablet Commonly known as:  PERCOCET/ROXICET Take 1-2 tablets by mouth every 4 (four) hours as needed for moderate pain.   pravastatin 20 MG tablet Commonly known as:  PRAVACHOL Take 2 tablets (40 mg total) by mouth daily.   testosterone cypionate 200 MG/ML injection Commonly known as:  DEPOTESTOSTERONE CYPIONATE INJECT 1 ML IN THE MUSCLE EVERY 14 DAYS   traZODone 50 MG tablet Commonly known as:  DESYREL Take 1-2 tablets (50-100 mg total) by mouth at bedtime.   VANISHPOINT SAFETY SYRINGE 22G X 1-1/2" 3 ML Misc Generic drug:  SYRINGE-NEEDLE (DISP) 3 ML USE AS DIRECTED TO SELF-ADMINISTER TESTOSTERONE

## 2015-09-19 NOTE — Progress Notes (Signed)
Patient ready for discharge to home; discharge instructions given and reviewed; Rx's given; patient discharged wearing his lumbar corset; accompanied home by his wife.

## 2015-09-19 NOTE — Progress Notes (Signed)
Feeling better Ready for d/c

## 2015-09-19 NOTE — Progress Notes (Signed)
Physical Therapy Treatment Patient Details Name: Curtis Yang MRN: [id number] DOB: 1960/06/15 Today's Date: 09/19/2015    History of Present Illness Jeremaine Maraj is a 55 year old male s/p L4-5 decompression, instrumentation, and fusion. PMH includes HLD, anxiety, depression, and previous fusion of L5-S1 two years ago.      PT Comments    Patient is independent with mobility and gait.  Was able to negotiate flight of stairs with supervision.  Answered questions from patient and wife.  Patient has achieved PT goals.  Patient is ready for d/c from PT perspective.  Follow Up Recommendations  No PT follow up;Supervision for mobility/OOB     Equipment Recommendations  None recommended by PT    Recommendations for Other Services       Precautions / Restrictions Precautions Precautions: Back Precaution Comments: Patient able to recall 3/3 back precautions.  Reviewed frequent position changes. Required Braces or Orthoses: Spinal Brace Spinal Brace: Lumbar corset (Able to don and doff independently) Restrictions Weight Bearing Restrictions: No    Mobility  Bed Mobility Overal bed mobility: Modified Independent             General bed mobility comments: Able to perform bed mobility while maintaining precautions with HOB and rails down. Requires extra time to do so.   Transfers Overall transfer level: Modified independent Equipment used: Straight cane Transfers: Sit to/from Stand Sit to Stand: Modified independent (Device/Increase time)         General transfer comment: Increased time and use of cane  Ambulation/Gait Ambulation/Gait assistance: Modified independent (Device/Increase time) Ambulation Distance (Feet): 200 Feet Assistive device: Straight cane Gait Pattern/deviations: Step-through pattern;Decreased stride length Gait velocity: decreased Gait velocity interpretation: Below normal speed for age/gender General Gait Details: Demonstrates safe use of  cane.  Good gait pattern and balance.   Stairs Stairs: Yes Stairs assistance: Supervision Stair Management: One rail Left;Alternating pattern;Forwards;With cane Number of Stairs: 11 General stair comments: Supervision for safety.  Instructed patient to use step-to pattern if pain increased or fatigued.  Wheelchair Mobility    Modified Rankin (Stroke Patients Only)       Balance           Standing balance support: No upper extremity supported Standing balance-Leahy Scale: Good                      Cognition Arousal/Alertness: Awake/alert Behavior During Therapy: WFL for tasks assessed/performed Overall Cognitive Status: Within Functional Limits for tasks assessed                      Exercises      General Comments        Pertinent Vitals/Pain Pain Assessment: 0-10 Pain Score: 5  Pain Location: Back Pain Descriptors / Indicators: Aching;Sore Pain Intervention(s): Monitored during session;Repositioned;Patient requesting pain meds-RN notified    Home Living                      Prior Function            PT Goals (current goals can now be found in the care plan section) Progress towards PT goals: Goals met/education completed, patient discharged from PT    Frequency  Min 5X/week    PT Plan Current plan remains appropriate    Co-evaluation             End of Session   Activity Tolerance: Patient tolerated treatment well Patient left: in bed;with call bell/phone within  reach;with family/visitor present     Time: 8022-1798 PT Time Calculation (min) (ACUTE ONLY): 15 min  Charges:  $Gait Training: 8-22 mins                    G Codes:      Despina Pole 06-Oct-2015, 11:02 AM Carita Pian. Sanjuana Kava, [facility] Pager [phone]'

## 2015-09-20 MED FILL — Sodium Chloride IV Soln 0.9%: INTRAVENOUS | Qty: 1000 | Status: AC

## 2015-09-20 MED FILL — Heparin Sodium (Porcine) Inj 1000 Unit/ML: INTRAMUSCULAR | Qty: 30 | Status: AC

## 2015-09-21 ENCOUNTER — Encounter: Payer: Self-pay | Admitting: Family Medicine

## 2015-10-03 ENCOUNTER — Encounter: Payer: Self-pay | Admitting: Family Medicine

## 2015-10-06 MED ORDER — DULOXETINE HCL 60 MG PO CPEP: 60.0000 mg | ORAL_CAPSULE | Freq: Every day | ORAL | 5 refills | Status: DC

## 2015-10-25 ENCOUNTER — Encounter: Payer: Self-pay | Admitting: Family Medicine

## 2015-11-11 ENCOUNTER — Encounter: Payer: Self-pay | Admitting: Family Medicine

## 2015-11-19 ENCOUNTER — Encounter: Payer: Self-pay | Admitting: Family Medicine

## 2015-11-19 NOTE — Telephone Encounter (Signed)
Dr Tamala Julian, I wasn't sure if you had seen pt's prev message, so I will re-route it to you now also.

## 2015-12-15 ENCOUNTER — Encounter: Payer: Self-pay | Admitting: Family Medicine

## 2016-02-09 ENCOUNTER — Ambulatory Visit (INDEPENDENT_AMBULATORY_CARE_PROVIDER_SITE_OTHER): Payer: 59 | Admitting: Family Medicine

## 2016-02-09 ENCOUNTER — Encounter: Payer: Self-pay | Admitting: Family Medicine

## 2016-02-09 VITALS — BP 128/98 | HR 116 | Temp 98.8°F | Resp 16 | Ht 73.0 in | Wt 211.2 lb

## 2016-02-09 DIAGNOSIS — F419 Anxiety disorder, unspecified: Secondary | ICD-10-CM | POA: Diagnosis not present

## 2016-02-09 DIAGNOSIS — F5104 Psychophysiologic insomnia: Secondary | ICD-10-CM | POA: Diagnosis not present

## 2016-02-09 DIAGNOSIS — M48061 Spinal stenosis, lumbar region without neurogenic claudication: Secondary | ICD-10-CM

## 2016-02-09 DIAGNOSIS — E349 Endocrine disorder, unspecified: Secondary | ICD-10-CM | POA: Diagnosis not present

## 2016-02-09 DIAGNOSIS — I10 Essential (primary) hypertension: Secondary | ICD-10-CM | POA: Diagnosis not present

## 2016-02-09 DIAGNOSIS — E785 Hyperlipidemia, unspecified: Secondary | ICD-10-CM | POA: Diagnosis not present

## 2016-02-09 DIAGNOSIS — R000 Tachycardia, unspecified: Secondary | ICD-10-CM

## 2016-02-09 DIAGNOSIS — R7309 Other abnormal glucose: Secondary | ICD-10-CM | POA: Diagnosis not present

## 2016-02-09 DIAGNOSIS — Z23 Encounter for immunization: Secondary | ICD-10-CM | POA: Diagnosis not present

## 2016-02-09 DIAGNOSIS — N5202 Corporo-venous occlusive erectile dysfunction: Secondary | ICD-10-CM | POA: Diagnosis not present

## 2016-02-09 DIAGNOSIS — M5116 Intervertebral disc disorders with radiculopathy, lumbar region: Secondary | ICD-10-CM | POA: Diagnosis not present

## 2016-02-09 DIAGNOSIS — K219 Gastro-esophageal reflux disease without esophagitis: Secondary | ICD-10-CM

## 2016-02-09 MED ORDER — METOPROLOL SUCCINATE ER 25 MG PO TB24: 25.0000 mg | ORAL_TABLET | Freq: Every day | ORAL | 0 refills | Status: DC

## 2016-02-09 MED ORDER — TESTOSTERONE CYPIONATE 200 MG/ML IM SOLN: INTRAMUSCULAR | 5 refills | Status: DC

## 2016-02-09 MED ORDER — PRAVASTATIN SODIUM 20 MG PO TABS: 40.0000 mg | ORAL_TABLET | Freq: Every day | ORAL | 11 refills | Status: DC

## 2016-02-09 MED ORDER — TRAZODONE HCL 50 MG PO TABS: 50.0000 mg | ORAL_TABLET | Freq: Every day | ORAL | 5 refills | Status: DC

## 2016-02-09 MED ORDER — GABAPENTIN 300 MG PO CAPS: 300.0000 mg | ORAL_CAPSULE | Freq: Three times a day (TID) | ORAL | 5 refills | Status: DC

## 2016-02-09 NOTE — Progress Notes (Signed)
Subjective:    Patient ID: Curtis Yang, male    DOB: 12-Sep-1960, 55 y.o.   MRN: [id number]  02/09/2016  Follow-up (labs, blood pressure )   HPI This 55 y.o. male presents for six month follow-up of hypercholesterolemia, glucose intolerance, anxiety, elevated blood pressure.  Stopped Cymbalta due to vivid dreams and body zaps.  Symptoms worsened and became noticeable on 40mg  daily; especially if missed dose or two, symptoms were really bad.  Medication was terrible.  Doing so so with anxiety currently; hydroxyzine six daily with moderate benefit.  Took Zoloft in the past with Dr. Elder Cyphers; previous Lexapro without benefit; tried Zoloft and had zaps with Zoloft after two weeks.  Gabapentin discussed at last visit; Dr. Arnoldo Morale prescribed for two weeks; helped with pain and anxiety.  Would be interested in Gabapentin in July and August; also prescribed steroids after surgery.  Follow-up with NS this month; still having daily pain.  No longer having leg pain; wakes up in the morning with horrible soreness.  Going back on 03/05/16.  About to go crazy at home. Fingernails are gone. Got a new dog for therapy.  Keeps busy. BP has been high; pulse has also been high.  Goes to [facility] once per week; readings are always high there; also always high at Dr. Adline Mango office. Taking hydrocodone three times daily with some relief. Has been taking for a few months; not as effective. When returns to work, will not be doing heavy work.  Last filled 10-30-15 gabapentin.  Trazodone 2 qhs and sleeping well.  Hydrocodone at 8, 5.  Taking Motrin as needed.  Had lumbar spine surgery in 08/2015.     Immunization History  Administered Date(s) Administered  . DTaP 07/04/2004, 11/14/2007  . Hepatitis A 04/20/2004, 09/26/2004  . Influenza, Seasonal, Injecte, Preservative Fre 01/24/2012  . Influenza,inj,Quad PF,36+ Mos 01/20/2013, 12/05/2013, 02/12/2015, 02/09/2016  . PPD Test 04/04/2012  . Tdap 10/18/2011  . Zoster  01/21/2013   . BP Readings from Last 3 Encounters:  02/09/16 (!) 128/98  09/19/15 124/88  09/14/15 120/80   Wt Readings from Last 3 Encounters:  02/09/16 211 lb 3.2 oz (95.8 kg)  09/16/15 221 lb 12.8 oz (100.6 kg)  09/14/15 211 lb (95.7 kg)    Review of Systems  Constitutional: Negative for activity change, appetite change, chills, diaphoresis, fatigue, fever and unexpected weight change.  HENT: Negative for congestion, dental problem, drooling, ear discharge, ear pain, facial swelling, hearing loss, mouth sores, nosebleeds, postnasal drip, rhinorrhea, sinus pressure, sneezing, sore throat, tinnitus, trouble swallowing and voice change.   Eyes: Negative for photophobia, pain, discharge, redness, itching and visual disturbance.  Respiratory: Negative for apnea, cough, choking, chest tightness, shortness of breath, wheezing and stridor.   Cardiovascular: Positive for palpitations. Negative for chest pain and leg swelling.  Gastrointestinal: Negative for abdominal pain, blood in stool, constipation, diarrhea, nausea and vomiting.  Endocrine: Negative for cold intolerance, heat intolerance, polydipsia, polyphagia and polyuria.  Genitourinary: Negative for decreased urine volume, difficulty urinating, discharge, dysuria, enuresis, flank pain, frequency, genital sores, hematuria, penile pain, penile swelling, scrotal swelling, testicular pain and urgency.  Musculoskeletal: Negative for arthralgias, back pain, gait problem, joint swelling, myalgias, neck pain and neck stiffness.  Skin: Negative for color change, pallor, rash and wound.  Allergic/Immunologic: Negative for environmental allergies, food allergies and immunocompromised state.  Neurological: Negative for dizziness, tremors, seizures, syncope, facial asymmetry, speech difficulty, weakness, light-headedness, numbness and headaches.  Hematological: Negative for adenopathy. Does not bruise/bleed easily.  Psychiatric/Behavioral: Positive  for sleep disturbance. Negative for agitation, behavioral problems, confusion, decreased concentration, dysphoric mood, hallucinations, self-injury and suicidal ideas. The patient is nervous/anxious. The patient is not hyperactive.     Past Medical History:  Diagnosis Date  . Anxiety   . Arthritis    DDD lumbar.  . Benign skin lesion of nose   . Depression   . Erectile dysfunction    Due to decreased testosterone   . GERD (gastroesophageal reflux disease)    NO TROUBLE IN YEARS  . History of colon polyps   . Hyperlipidemia   . Hypogonadism male   . Increased glucose level   . Insomnia   . Low back pain    Past Surgical History:  Procedure Laterality Date  . BREAST MASS EXCISION     RIGHT CHEST  BENIGN  . COLONOSCOPY W/ POLYPECTOMY  09/2013   One polyp, came back pre-cancerous. Repeat 5 years unless symptoms present.   [name] EYE SURGERY  02/28/2000   Lasik  . MASS EXCISION     RIGHT EAR   BENIGN  . SPINE SURGERY  09/26/2012   [facility]-L5 [facility] [place].  . TRANSFORAMINAL LUMBAR INTERBODY FUSION (TLIF) WITH PEDICLE SCREW FIXATION 1 LEVEL Right 04/22/2013   Procedure: TRANSFORAMINAL LUMBAR INTERBODY FUSION (TLIF) WITH PEDICLE SCREW FIXATION LUMBAR FIVE SACRAL ONE;  Surgeon: Faythe Ghee, MD;  Location: [facility];  Service: Neurosurgery;  Laterality: Right;   No Known Allergies Current Outpatient Prescriptions  Medication Sig Dispense Refill  . aspirin 81 MG chewable tablet Chew 81 mg by mouth daily.    [name] HYDROcodone-acetaminophen (NORCO) 10-325 MG tablet Take 1 tablet by mouth every 8 (eight) hours.    . hydrOXYzine (ATARAX/VISTARIL) 25 MG tablet Take 1-2 tablets (25-50 mg total) by mouth 3 (three) times daily as needed for anxiety. (Patient taking differently: Take 50 mg by mouth 3 (three) times daily as needed for anxiety. ) 180 tablet 5  . pravastatin (PRAVACHOL) 20 MG tablet Take 2 tablets (40 mg total) by mouth daily. 60 tablet 11  . testosterone cypionate  (DEPOTESTOSTERONE CYPIONATE) 200 MG/ML injection INJECT 1 ML IN THE MUSCLE EVERY 14 DAYS 3 mL 5  . traZODone (DESYREL) 50 MG tablet Take 1-2 tablets (50-100 mg total) by mouth at bedtime. 60 tablet 5  . DULoxetine (CYMBALTA) 60 MG capsule Take 1 capsule (60 mg total) by mouth daily. (Patient not taking: Reported on 02/09/2016) 30 capsule 5  . gabapentin (NEURONTIN) 300 MG capsule Take 1 capsule (300 mg total) by mouth 3 (three) times daily. 90 capsule 5  . metoprolol succinate (TOPROL-XL) 25 MG 24 hr tablet Take 1 tablet (25 mg total) by mouth daily. Take with or immediately following a meal. 90 tablet 0  . VANISHPOINT SAFETY SYRINGE 22G X 1-1/2" 3 ML MISC USE AS DIRECTED TO SELF-ADMINISTER TESTOSTERONE 50 each 0   No current facility-administered medications for this visit.    Social History   Social History  . Marital status: Married    Spouse name: N/A  . Number of children: N/A  . Years of education: N/A   Occupational History  . Not on file.   Social History Main Topics  . Smoking status: Never Smoker  . Smokeless tobacco: Never Used  . Alcohol use No  . Drug use: No  . Sexual activity: Yes     Comment: number of sex partners in the last 12 months  1   Other Topics Concern  .  Not on file   Social History Narrative   Marital status:  Married x 41 years; happily married; second marriage; first wife died AMI age 32.        Children:  1 child; 3 stepchildren.  Several grandchildren.      Lives: with wife.      Employment:  [employer]; Print production planner x 18 years; happy.  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        Tobacco: never       Alcohol:  None      Drugs: none      Exercise:  Walking twice daily 2-3 miles total each day.  Job physically demanding.        Seatbelt: 100%; no texting      Guns: yes; unloaded; in safe.      Sunscreen: SPF on face.   Family History  Problem Relation Age of Onset  . Heart disease Maternal Grandmother   . Hypertension Maternal  Grandmother   . Stroke Maternal Grandfather   . Anxiety disorder Mother   . Cancer Paternal Grandfather     prostate  . Anxiety disorder Brother   . Stroke Paternal Grandmother   . Colon cancer Paternal Aunt 60       Objective:    BP (!) 128/98 (BP Location: Right Arm, Patient Position: Sitting, Cuff Size: Small)   Pulse (!) 116   Temp 98.8 F (37.1 C) (Oral)   Resp 16   Ht 6\' 1"  (1.854 m)   Wt 211 lb 3.2 oz (95.8 kg)   SpO2 99%   BMI 27.86 kg/m  Physical Exam  Constitutional: He is oriented to person, place, and time. He appears well-developed and well-nourished. No distress.  HENT:  Head: Normocephalic and atraumatic.  Right Ear: External ear normal.  Left Ear: External ear normal.  Nose: Nose normal.  Mouth/Throat: Oropharynx is clear and moist.  Eyes: Conjunctivae and EOM are normal. Pupils are equal, round, and reactive to light.  Neck: Normal range of motion. Neck supple. Carotid bruit is not present. No thyromegaly present.  Cardiovascular: Regular rhythm, normal heart sounds and intact distal pulses.  Tachycardia present.  Exam reveals no gallop and no friction rub.   No murmur heard. Pulmonary/Chest: Effort normal and breath sounds normal. He has no wheezes. He has no rales.  Abdominal: Soft. Bowel sounds are normal. He exhibits no distension and no mass. There is no tenderness. There is no rebound and no guarding.  Musculoskeletal:       Right shoulder: Normal.       Left shoulder: Normal.       Cervical back: Normal.  Lymphadenopathy:    He has no cervical adenopathy.  Neurological: He is alert and oriented to person, place, and time. He has normal reflexes. No cranial nerve deficit. He exhibits normal muscle tone. Coordination normal.  Skin: Skin is warm and dry. No rash noted. He is not diaphoretic.  Psychiatric: He has a normal mood and affect. His behavior is normal. Judgment and thought content normal.  Repetitive blinking during exam which is new.    Results for orders placed or performed during the hospital encounter of 09/16/15  CBC  Result Value Ref Range   WBC 10.7 (H) 4.0 - 10.5 K/uL   RBC 4.34 4.22 - 5.81 MIL/uL   Hemoglobin 13.0 13.0 - 17.0 g/dL   HCT 37.1 (L) 39.0 - 52.0 %   MCV 85.5 78.0 - 100.0 fL  MCH 30.0 26.0 - 34.0 pg   MCHC 35.0 30.0 - 36.0 g/dL   RDW 12.2 11.5 - 15.5 %   Platelets 182 150 - 400 K/uL  Basic Metabolic Panel  Result Value Ref Range   Sodium 137 135 - 145 mmol/L   Potassium 4.1 3.5 - 5.1 mmol/L   Chloride 104 101 - 111 mmol/L   CO2 25 22 - 32 mmol/L   Glucose, Bld 116 (H) 65 - 99 mg/dL   BUN 11 6 - 20 mg/dL   Creatinine, Ser 1.20 0.61 - 1.24 mg/dL   Calcium 8.7 (L) 8.9 - 10.3 mg/dL   GFR calc non Af Amer >60 >60 mL/min   GFR calc Af Amer >60 >60 mL/min   Anion gap 8 5 - 15   EKG; sinus tachy at 102; no acute changes.    Assessment & Plan:   1. Essential hypertension   2. Gastroesophageal reflux disease without esophagitis   3. Lumbar disc herniation with radiculopathy   4. Corporo-venous occlusive erectile dysfunction   5. Anxiety   6. Increased glucose level   7. Increased serum lipids   8. Psychophysiological insomnia   9. Spinal stenosis of lumbar region without neurogenic claudication   10. Testosterone deficiency   11. Need for prophylactic vaccination and inoculation against influenza   12. Tachycardia    -new onset hypertension with tachycardia; obtain EKG, labs; start Metoprolol ER 25mg  daily.  RTC six weeks for recheck. -anxiety uncontrolled; intolerant to Cymbalta/Zoloft in the past.  Rx for Neurontin 300mg  tid for DDD lumbar and anxiety.  Stop Atarax.  Consider addition of Paxil or Lexapro at follow-up; also increase dose of Gabapentin at next visit if needed.   -insomnia well controlled with Trazodone 100mg  qhs; consider replacing Trazodone with Neurontin. -suffering with ongoing lower back pain despite surgery in 08/2015; follow up with NS this month; using hydrocodone  sparingly. -obtain labs. -continue other chronic medications.   Orders Placed This Encounter  Procedures  . Flu Vaccine QUAD 36+ mos PF IM (Fluarix & Fluzone Quad PF)  . CBC with Differential/Platelet  . Comprehensive metabolic panel    Order Specific Question:   Has the patient fasted?    Answer:   Yes  . Hemoglobin A1c  . Lipid panel    Order Specific Question:   Has the patient fasted?    Answer:   Yes  . Testosterone  . EKG 12-Lead   Meds ordered this encounter  Medications  . aspirin 81 MG chewable tablet    Sig: Chew 81 mg by mouth daily.  [name] HYDROcodone-acetaminophen (NORCO) 10-325 MG tablet    Sig: Take 1 tablet by mouth every 8 (eight) hours.  . gabapentin (NEURONTIN) 300 MG capsule    Sig: Take 1 capsule (300 mg total) by mouth 3 (three) times daily.    Dispense:  90 capsule    Refill:  5  . metoprolol succinate (TOPROL-XL) 25 MG 24 hr tablet    Sig: Take 1 tablet (25 mg total) by mouth daily. Take with or immediately following a meal.    Dispense:  90 tablet    Refill:  0  . testosterone cypionate (DEPOTESTOSTERONE CYPIONATE) 200 MG/ML injection    Sig: INJECT 1 ML IN THE MUSCLE EVERY 14 DAYS    Dispense:  3 mL    Refill:  5  . traZODone (DESYREL) 50 MG tablet    Sig: Take 1-2 tablets (50-100 mg total) by mouth at bedtime.  Dispense:  60 tablet    Refill:  5  . pravastatin (PRAVACHOL) 20 MG tablet    Sig: Take 2 tablets (40 mg total) by mouth daily.    Dispense:  60 tablet    Refill:  11    Return in about 6 weeks (around 03/22/2016) for recheck blood pressure, anxiety.   Erico Stan Elayne Guerin, M.D. [facility] [address] [place],   [phone] phone [phone] fax

## 2016-02-10 LAB — CBC WITH DIFFERENTIAL/PLATELET
BASOS: 0 %
Basophils Absolute: 0 10*3/uL (ref 0.0–0.2)
EOS (ABSOLUTE): 0.1 10*3/uL (ref 0.0–0.4)
Eos: 1 %
Hematocrit: 47.3 % (ref 37.5–51.0)
Hemoglobin: 15.9 g/dL (ref 13.0–17.7)
Immature Grans (Abs): 0 10*3/uL (ref 0.0–0.1)
Immature Granulocytes: 0 %
LYMPHS ABS: 1.3 10*3/uL (ref 0.7–3.1)
Lymphs: 18 %
MCH: 30.1 pg (ref 26.6–33.0)
MCHC: 33.6 g/dL (ref 31.5–35.7)
MCV: 89 fL (ref 79–97)
MONOS ABS: 0.5 10*3/uL (ref 0.1–0.9)
Monocytes: 7 %
NEUTROS ABS: 5.6 10*3/uL (ref 1.4–7.0)
NEUTROS PCT: 74 %
PLATELETS: 241 10*3/uL (ref 150–379)
RBC: 5.29 x10E6/uL (ref 4.14–5.80)
RDW: 15.5 % — AB (ref 12.3–15.4)
WBC: 7.5 10*3/uL (ref 3.4–10.8)

## 2016-02-10 LAB — COMPREHENSIVE METABOLIC PANEL
A/G RATIO: 2 (ref 1.2–2.2)
ALT: 24 IU/L (ref 0–44)
AST: 18 IU/L (ref 0–40)
Albumin: 5.1 g/dL (ref 3.5–5.5)
Alkaline Phosphatase: 78 IU/L (ref 39–117)
BILIRUBIN TOTAL: 0.6 mg/dL (ref 0.0–1.2)
BUN/Creatinine Ratio: 11 (ref 9–20)
BUN: 11 mg/dL (ref 6–24)
CHLORIDE: 101 mmol/L (ref 96–106)
CO2: 23 mmol/L (ref 18–29)
Calcium: 9.8 mg/dL (ref 8.7–10.2)
Creatinine, Ser: 1.04 mg/dL (ref 0.76–1.27)
GFR calc Af Amer: 92 mL/min/{1.73_m2} (ref >59)
GFR calc non Af Amer: 79 mL/min/{1.73_m2} (ref >59)
Globulin, Total: 2.5 g/dL (ref 1.5–4.5)
Glucose: 91 mg/dL (ref 65–99)
POTASSIUM: 4.5 mmol/L (ref 3.5–5.2)
Sodium: 143 mmol/L (ref 134–144)
Total Protein: 7.6 g/dL (ref 6.0–8.5)

## 2016-02-10 LAB — TESTOSTERONE: TESTOSTERONE: 334 ng/dL (ref 264–916)

## 2016-02-10 LAB — LIPID PANEL
CHOLESTEROL TOTAL: 173 mg/dL (ref 100–199)
Chol/HDL Ratio: 3 ratio units (ref 0.0–5.0)
HDL: 58 mg/dL (ref >39)
LDL CALC: 88 mg/dL (ref 0–99)
Triglycerides: 133 mg/dL (ref 0–149)
VLDL Cholesterol Cal: 27 mg/dL (ref 5–40)

## 2016-02-10 LAB — HEMOGLOBIN A1C
ESTIMATED AVERAGE GLUCOSE: 114 mg/dL
Hgb A1c MFr Bld: 5.6 % (ref 4.8–5.6)

## 2016-02-15 ENCOUNTER — Encounter: Payer: Self-pay | Admitting: Family Medicine

## 2016-03-01 ENCOUNTER — Other Ambulatory Visit: Payer: Self-pay | Admitting: Family Medicine

## 2016-03-22 ENCOUNTER — Ambulatory Visit (INDEPENDENT_AMBULATORY_CARE_PROVIDER_SITE_OTHER): Payer: 59 | Admitting: Family Medicine

## 2016-03-22 ENCOUNTER — Encounter: Payer: Self-pay | Admitting: Family Medicine

## 2016-03-22 ENCOUNTER — Telehealth: Payer: Self-pay

## 2016-03-22 VITALS — BP 146/95 | HR 111 | Temp 98.7°F | Resp 18 | Ht 73.0 in | Wt 221.8 lb

## 2016-03-22 DIAGNOSIS — I10 Essential (primary) hypertension: Secondary | ICD-10-CM

## 2016-03-22 DIAGNOSIS — M48061 Spinal stenosis, lumbar region without neurogenic claudication: Secondary | ICD-10-CM | POA: Diagnosis not present

## 2016-03-22 DIAGNOSIS — F419 Anxiety disorder, unspecified: Secondary | ICD-10-CM | POA: Diagnosis not present

## 2016-03-22 DIAGNOSIS — M5116 Intervertebral disc disorders with radiculopathy, lumbar region: Secondary | ICD-10-CM | POA: Diagnosis not present

## 2016-03-22 DIAGNOSIS — F5104 Psychophysiologic insomnia: Secondary | ICD-10-CM

## 2016-03-22 MED ORDER — PAROXETINE HCL ER 12.5 MG PO TB24: 12.5000 mg | ORAL_TABLET | Freq: Every day | ORAL | 0 refills | Status: DC

## 2016-03-22 MED ORDER — METOPROLOL SUCCINATE ER 50 MG PO TB24: 50.0000 mg | ORAL_TABLET | Freq: Every day | ORAL | 1 refills | Status: DC

## 2016-03-22 NOTE — Telephone Encounter (Signed)
Pt saw dr Tamala Julian this morning and was given a rx for paroxetine and the extended release is too expensive and he would like to change to regular release   Best number [phone]

## 2016-03-22 NOTE — Progress Notes (Signed)
Subjective:    Patient ID: Curtis Yang, male    DOB: 1960/10/29, 56 y.o.   MRN: [id number]  03/22/2016  Anxiety (on going issue; states it has gotten worse over past few weeks) and recheck blood pressure (on Jan 2nd he saw his doc and his bp and pulse was high)   HPI This 56 y.o. male presents for one month follow-up of hypertension new onset, anxiety.  Pain is 2-3/10 constantly.  Anxiety has been outrageous.  Returned to work two weeks ago; anxiety is really getting to patient; cannot concentrate.  Taking Gabapentin twice daily; not taking before work yet does not seem to make drowsy just being cautious.  Gabapentin does seem to help anxiety and back pain. Taking Metoprolol ER 25mg  daily.  Anxiety 25% improved with Gabapentin; pain 50% improved. Sleeping well.  Using two Trazodone qhs.  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  Driving 3-4 hours to jobs that usually would not need to do.  No SI.  Did not tolerate Lexapro by Guest.    Immunization History  Administered Date(s) Administered  . DTaP 07/04/2004, 11/14/2007  . Hepatitis A 04/20/2004, 09/26/2004  . Influenza, Seasonal, Injecte, Preservative Fre 01/24/2012  . Influenza,inj,Quad PF,36+ Mos 01/20/2013, 12/05/2013, 02/12/2015, 02/09/2016  . PPD Test 04/04/2012  . Tdap 10/18/2011  . Zoster 01/21/2013   BP Readings from Last 3 Encounters:  03/22/16 (!) 146/95  02/09/16 (!) 128/98  09/19/15 124/88    Wt Readings from Last 3 Encounters:  03/22/16 221 lb 12.8 oz (100.6 kg)  02/09/16 211 lb 3.2 oz (95.8 kg)  09/16/15 221 lb 12.8 oz (100.6 kg)    Review of Systems  Constitutional: Negative for activity change, appetite change, chills, diaphoresis, fatigue and fever.  Eyes: Negative for visual disturbance.  Respiratory: Negative for cough and shortness of breath.   Cardiovascular: Positive for palpitations. Negative for chest pain and leg swelling.  Endocrine: Negative for  cold intolerance, heat intolerance, polydipsia, polyphagia and polyuria.  Musculoskeletal: Positive for back pain.  Neurological: Negative for dizziness, tremors, seizures, syncope, facial asymmetry, speech difficulty, weakness, light-headedness, numbness and headaches.  Psychiatric/Behavioral: Positive for decreased concentration. Negative for self-injury, sleep disturbance and suicidal ideas. The patient is nervous/anxious.     Past Medical History:  Diagnosis Date  . Anxiety   . Arthritis    DDD lumbar.  . Benign skin lesion of nose   . Depression   . Erectile dysfunction    Due to decreased testosterone   . GERD (gastroesophageal reflux disease)    NO TROUBLE IN YEARS  . History of colon polyps   . Hyperlipidemia   . Hypogonadism male   . Increased glucose level   . Insomnia   . Low back pain    Past Surgical History:  Procedure Laterality Date  . BREAST MASS EXCISION     RIGHT CHEST  BENIGN  . COLONOSCOPY W/ POLYPECTOMY  09/2013   One polyp, came back pre-cancerous. Repeat 5 years unless symptoms present.   [name] EYE SURGERY  02/28/2000   Lasik  . MASS EXCISION     RIGHT EAR   BENIGN  . SPINE SURGERY  09/26/2012   [facility] [place].  . TRANSFORAMINAL LUMBAR INTERBODY FUSION (TLIF) WITH PEDICLE SCREW FIXATION 1 LEVEL Right 04/22/2013   Procedure: TRANSFORAMINAL LUMBAR INTERBODY FUSION (TLIF) WITH PEDICLE SCREW FIXATION LUMBAR FIVE SACRAL ONE;  Surgeon: Faythe Ghee, MD;  Location: [facility];  Service: Neurosurgery;  Laterality: Right;   No Known Allergies  Social History   Social History  . Marital status: Married    Spouse name: N/A  . Number of children: N/A  . Years of education: N/A   Occupational History  . Not on file.   Social History Main Topics  . Smoking status: Never Smoker  . Smokeless tobacco: Never Used  . Alcohol use No  . Drug use: No  . Sexual activity: Yes     Comment: number of sex partners in the last 12 months  1    Other Topics Concern  . Not on file   Social History Narrative   Marital status:  Married x 20 years; happily married; second marriage; first wife died AMI age 60.        Children:  1 child; 3 stepchildren.  Several grandchildren.      Lives: with wife.      Employment:  [employer]; Print production planner x 18 years; happy.  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        Tobacco: never       Alcohol:  None      Drugs: none      Exercise:  Walking twice daily 2-3 miles total each day.  Job physically demanding.        Seatbelt: 100%; no texting      Guns: yes; unloaded; in safe.      Sunscreen: SPF on face.   Family History  Problem Relation Age of Onset  . Heart disease Maternal Grandmother   . Hypertension Maternal Grandmother   . Stroke Maternal Grandfather   . Anxiety disorder Mother   . Cancer Paternal Grandfather     prostate  . Anxiety disorder Brother   . Stroke Paternal Grandmother   . Colon cancer Paternal Aunt 60       Objective:    BP (!) 146/95   Pulse (!) 111   Temp 98.7 F (37.1 C) (Oral)   Resp 18   Ht 6\' 1"  (1.854 m)   Wt 221 lb 12.8 oz (100.6 kg)   SpO2 100%   BMI 29.26 kg/m  Physical Exam  Constitutional: He is oriented to person, place, and time. He appears well-developed and well-nourished. No distress.  HENT:  Head: Normocephalic and atraumatic.  Right Ear: External ear normal.  Left Ear: External ear normal.  Nose: Nose normal.  Mouth/Throat: Oropharynx is clear and moist.  Eyes: Conjunctivae and EOM are normal. Pupils are equal, round, and reactive to light.  Neck: Normal range of motion. Neck supple. Carotid bruit is not present. No thyromegaly present.  Cardiovascular: Normal rate, regular rhythm, normal heart sounds and intact distal pulses.  Exam reveals no gallop and no friction rub.   No murmur heard. Pulmonary/Chest: Effort normal and breath sounds normal. He has no wheezes. He has no rales.  Abdominal: Soft. Bowel sounds are  normal. He exhibits no distension and no mass. There is no tenderness. There is no rebound and no guarding.  Lymphadenopathy:    He has no cervical adenopathy.  Neurological: He is alert and oriented to person, place, and time. No cranial nerve deficit. He exhibits normal muscle tone. Coordination normal.  Skin: Skin is warm and dry. No rash noted. He is not diaphoretic.  Psychiatric: He has a normal mood and affect. His behavior is normal. Judgment and thought content normal.  Nursing note and vitals reviewed.  Depression screen [facility] 2/9 03/22/2016  02/09/2016 09/14/2015 08/12/2015 08/05/2015  Decreased Interest 0 0 0 0 0  Down, Depressed, Hopeless 0 0 0 0 0  PHQ - 2 Score 0 0 0 0 0        Assessment & Plan:   1. Anxiety   2. Essential hypertension   3. Psychophysiological insomnia   4. Spinal stenosis of lumbar region without neurogenic claudication   5. Lumbar disc herniation with radiculopathy    -start Paxil CR 12.5mg  daily. -continue Gabapentin but increase to 1-2 tid. -increase Metoprolol ER 50mg  daily for hypertension and tachycardia.   No orders of the defined types were placed in this encounter.  Meds ordered this encounter  Medications  . DISCONTD: PARoxetine (PAXIL-CR) 12.5 MG 24 hr tablet    Sig: Take 1 tablet (12.5 mg total) by mouth daily.    Dispense:  90 tablet    Refill:  0  . metoprolol succinate (TOPROL-XL) 50 MG 24 hr tablet    Sig: Take 1 tablet (50 mg total) by mouth daily. Take with or immediately following a meal.    Dispense:  90 tablet    Refill:  1    Return in about 6 weeks (around 05/03/2016) for recheck BLOOD PRESSURE, ANXIETY.   Odena Mcquaid Elayne Guerin, M.D. [facility] [address] [place], [place]  [address] [phone] phone [phone] fax

## 2016-03-22 NOTE — Telephone Encounter (Signed)
Please change rx if appropriate.

## 2016-03-22 NOTE — Patient Instructions (Addendum)
INCREASE METOPROLOL ER 25MG  TO TWO DAILY UNTIL CURRENT PRESCRIPTION IS GONE; THEN START 50MG  TABLET. START PAXIL XR 12.5MG  ONE DAILY FOR ANXIETY. START TAKING GABAPENTIN ONE EVERY MORNING; INCREASE TO TWO TABLETS OF GABAPENTIN AFTER WORK AND TWO AT BEDTIME. YOU CAN TRY TO WEAN TRAZODONE SINCE GOING UP ON GABAPENTIN AT BEDTIME.     IF you received an x-ray today, you will receive an invoice from [facility]. Please contact [facility] at [phone] with questions or concerns regarding your invoice.   IF you received labwork today, you will receive an invoice from [facility]. Please contact [facility] at [phone] with questions or concerns regarding your invoice.   Our billing staff will not be able to assist you with questions regarding bills from these companies.  You will be contacted with the lab results as soon as they are available. The fastest way to get your results is to activate your My Chart account. Instructions are located on the last page of this paperwork. If you have not heard from [place] regarding the results in 2 weeks, please contact this office.

## 2016-03-23 MED ORDER — GABAPENTIN 300 MG PO CAPS: 300.0000 mg | ORAL_CAPSULE | Freq: Three times a day (TID) | ORAL | 5 refills | Status: DC

## 2016-03-23 MED ORDER — PAROXETINE HCL 20 MG PO TABS: 20.0000 mg | ORAL_TABLET | Freq: Every day | ORAL | 0 refills | Status: DC

## 2016-03-23 NOTE — Telephone Encounter (Signed)
Paxil 20mg  one tablet daily sent to pharmacy to replace Paxil XR 12.5mg  daily. MyChart message sent to patient.

## 2016-03-23 NOTE — Addendum Note (Signed)
Addended by: Wardell Honour on: 03/23/2016 11:49 PM   Modules accepted: Orders

## 2016-03-27 ENCOUNTER — Encounter: Payer: Self-pay | Admitting: Family Medicine

## 2016-03-27 DIAGNOSIS — I10 Essential (primary) hypertension: Secondary | ICD-10-CM | POA: Insufficient documentation

## 2016-05-16 ENCOUNTER — Ambulatory Visit: Payer: 59 | Admitting: Family Medicine

## 2016-06-15 ENCOUNTER — Encounter: Payer: Self-pay | Admitting: Family Medicine

## 2016-06-19 ENCOUNTER — Telehealth: Payer: Self-pay | Admitting: Family Medicine

## 2016-06-19 MED ORDER — PAROXETINE HCL 20 MG PO TABS: 20.0000 mg | ORAL_TABLET | Freq: Every day | ORAL | 0 refills | Status: DC

## 2016-06-19 NOTE — Telephone Encounter (Signed)
Pt is calling to check on his Paxil medication refill.  He states the pharmacy has contacted [place] but a refill has not been sent in yet. [phone]

## 2016-07-30 ENCOUNTER — Other Ambulatory Visit: Payer: Self-pay | Admitting: Family Medicine

## 2016-07-31 NOTE — Telephone Encounter (Signed)
01/2016 last refill with 5   02/2016 last ov

## 2016-08-28 ENCOUNTER — Other Ambulatory Visit: Payer: Self-pay | Admitting: Family Medicine

## 2016-08-29 NOTE — Telephone Encounter (Signed)
Call --- testosterone rx will be ready for pick up this afternoon.

## 2016-09-08 ENCOUNTER — Other Ambulatory Visit: Payer: Self-pay | Admitting: Family Medicine

## 2016-09-12 ENCOUNTER — Other Ambulatory Visit: Payer: Self-pay | Admitting: *Deleted

## 2016-09-12 DIAGNOSIS — F329 Major depressive disorder, single episode, unspecified: Secondary | ICD-10-CM

## 2016-09-12 MED ORDER — PAROXETINE HCL 20 MG PO TABS: 20.0000 mg | ORAL_TABLET | Freq: Every day | ORAL | 0 refills | Status: DC

## 2016-09-20 IMAGING — CR DG LUMBAR SPINE 1V
1 series · 1 of 1 positions shown · non-contrast
Comparison: None.

CLINICAL DATA: Lumbar spinal stenosis.  Elective surgery.

EXAM:
LUMBAR SPINE - 1 VIEW

[lat]
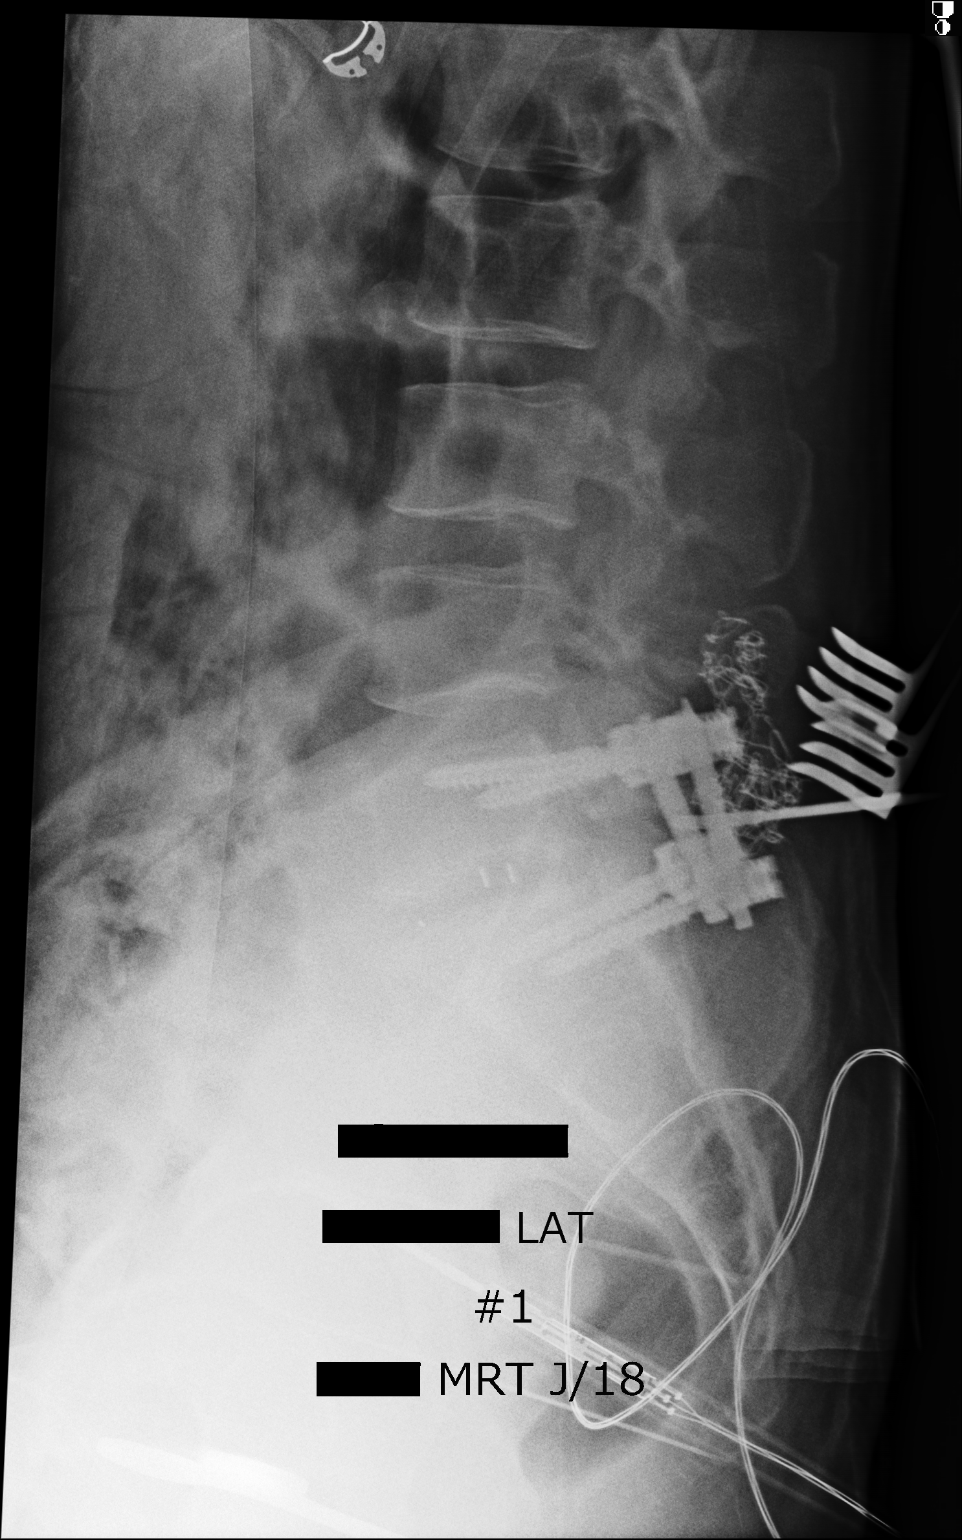

[1 of 1 positions shown; findings below may reference images not displayed]

FINDINGS: Intraoperative cross-table lateral view shows previous PLIF hardware
at L5-S1. A probe is seen overlying the posterior interspinous space
at the level of L5-S1.
IMPRESSION: Intraoperative localization of L5-S1.

## 2016-09-20 IMAGING — RF DG C-ARM 61-120 MIN
1 series · 2 of 2 positions shown · non-contrast
Comparison: None.

CLINICAL DATA: Lumbar spine stenosis.

EXAM:
DG C-ARM 61-120 MIN; LUMBAR SPINE - 2-3 VIEW

[Series 1: run · 2 of 2 slices shown]
[im 1/2]
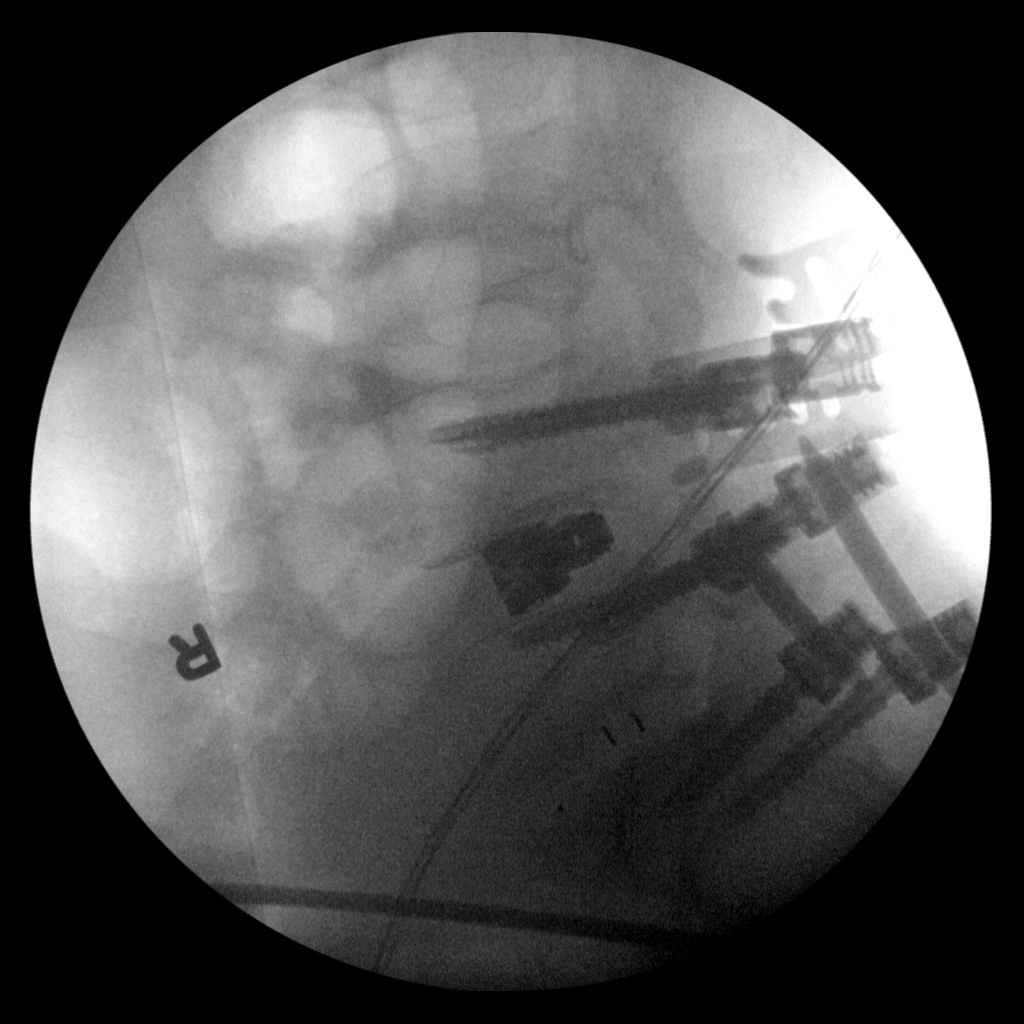
[im 2/2]
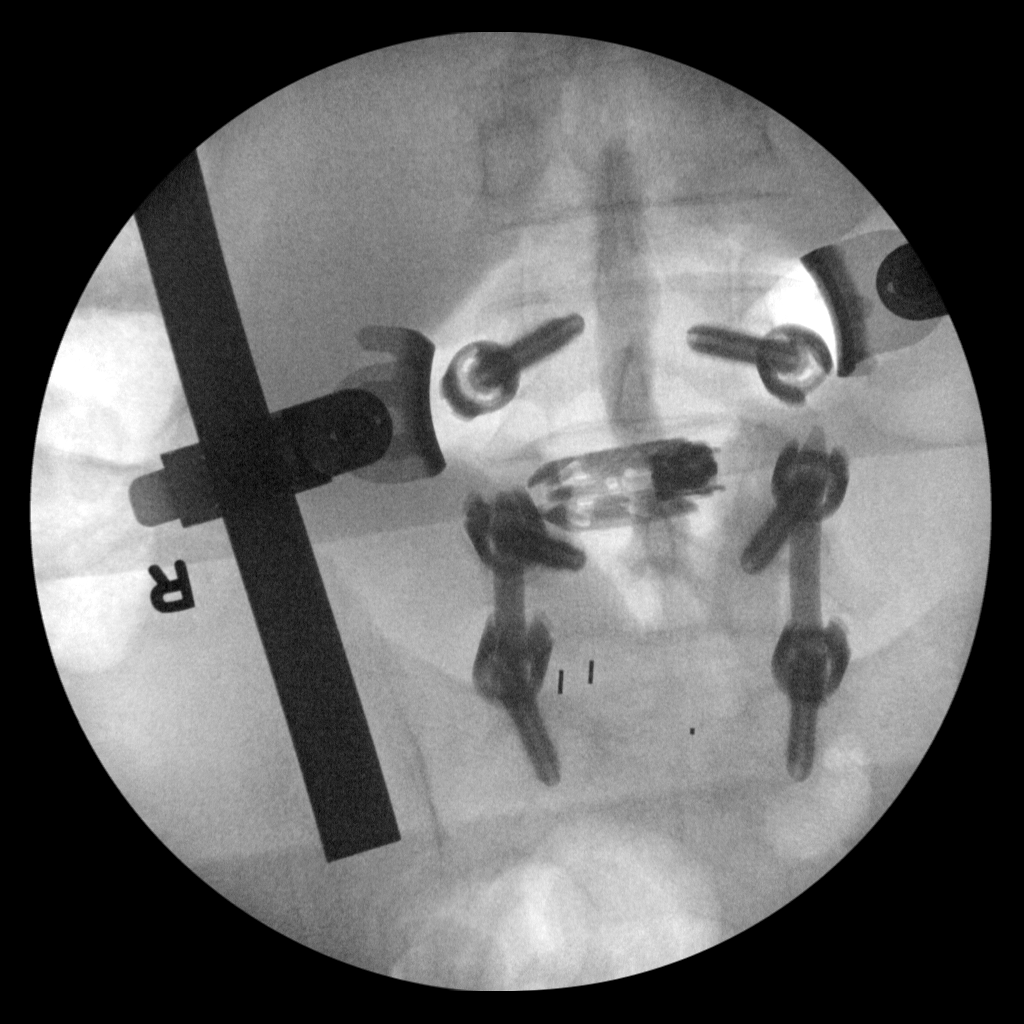

[2 of 2 positions shown; findings below may reference images not displayed]

FINDINGS: Previous PLIF hardware seen at L5-S1. New pedicle screws are seen at
level of L4 with new interbody fusionl cage at L4-5.
IMPRESSION: New PLIF hardware at L4-5.

## 2016-09-26 ENCOUNTER — Ambulatory Visit: Payer: 59 | Admitting: Family Medicine

## 2016-10-04 ENCOUNTER — Ambulatory Visit (INDEPENDENT_AMBULATORY_CARE_PROVIDER_SITE_OTHER): Payer: 59 | Admitting: Family Medicine

## 2016-10-04 ENCOUNTER — Encounter: Payer: Self-pay | Admitting: Family Medicine

## 2016-10-04 VITALS — BP 133/82 | HR 60 | Temp 98.1°F | Resp 18 | Ht 72.44 in | Wt 216.0 lb

## 2016-10-04 DIAGNOSIS — E291 Testicular hypofunction: Secondary | ICD-10-CM

## 2016-10-04 DIAGNOSIS — Z0000 Encounter for general adult medical examination without abnormal findings: Secondary | ICD-10-CM

## 2016-10-04 DIAGNOSIS — N481 Balanitis: Secondary | ICD-10-CM | POA: Diagnosis not present

## 2016-10-04 DIAGNOSIS — I10 Essential (primary) hypertension: Secondary | ICD-10-CM | POA: Diagnosis not present

## 2016-10-04 DIAGNOSIS — Z125 Encounter for screening for malignant neoplasm of prostate: Secondary | ICD-10-CM | POA: Diagnosis not present

## 2016-10-04 DIAGNOSIS — Z131 Encounter for screening for diabetes mellitus: Secondary | ICD-10-CM | POA: Diagnosis not present

## 2016-10-04 DIAGNOSIS — E7800 Pure hypercholesterolemia, unspecified: Secondary | ICD-10-CM | POA: Diagnosis not present

## 2016-10-04 LAB — POCT URINALYSIS DIP (MANUAL ENTRY)
BILIRUBIN UA: NEGATIVE
BILIRUBIN UA: NEGATIVE mg/dL
Blood, UA: NEGATIVE
GLUCOSE UA: NEGATIVE mg/dL
Leukocytes, UA: NEGATIVE
Nitrite, UA: NEGATIVE
Protein Ur, POC: NEGATIVE mg/dL
SPEC GRAV UA: 1.02 (ref 1.010–1.025)
Urobilinogen, UA: 0.2 E.U./dL
pH, UA: 6 (ref 5.0–8.0)

## 2016-10-04 MED ORDER — GABAPENTIN 300 MG PO CAPS: 300.0000 mg | ORAL_CAPSULE | Freq: Three times a day (TID) | ORAL | 5 refills | Status: DC

## 2016-10-04 MED ORDER — TESTOSTERONE CYPIONATE 200 MG/ML IM SOLN: INTRAMUSCULAR | 5 refills | Status: DC

## 2016-10-04 MED ORDER — PRAVASTATIN SODIUM 20 MG PO TABS
40.0000 mg | ORAL_TABLET | Freq: Every day | ORAL | 1 refills | Status: DC
Start: 2016-10-04 — End: 2017-03-23

## 2016-10-04 MED ORDER — METOPROLOL SUCCINATE ER 50 MG PO TB24: ORAL_TABLET | ORAL | 1 refills | Status: DC

## 2016-10-04 MED ORDER — TRAZODONE HCL 50 MG PO TABS: ORAL_TABLET | ORAL | 1 refills | Status: DC

## 2016-10-04 MED ORDER — KETOCONAZOLE 2 % EX CREA: 1.0000 "application " | TOPICAL_CREAM | Freq: Two times a day (BID) | CUTANEOUS | 0 refills | Status: DC

## 2016-10-04 MED ORDER — PAROXETINE HCL 40 MG PO TABS: 40.0000 mg | ORAL_TABLET | Freq: Every day | ORAL | 1 refills | Status: DC

## 2016-10-04 NOTE — Progress Notes (Signed)
Subjective:    Patient ID: Curtis Yang, male    DOB: 06/02/1960, 56 y.o.   MRN: [id number]  HPI This 56 y.o. male presents for Routine Physical Examination.  Last physical:  09-14-2015 Colonoscopy:  09-2013 PSA: 08-2015 Eye exam:  2017 Dental exam:  Every six months.  Anxiety: started Paxil; increased gabapentin to tid; increased Metoprolol to 50 mg daily. BP mostly running 130-140s.     BP Readings from Last 3 Encounters:  10/04/16 133/82  03/22/16 (!) 146/95  02/09/16 (!) 128/98   Wt Readings from Last 3 Encounters:  10/04/16 216 lb (98 kg)  03/22/16 221 lb 12.8 oz (100.6 kg)  02/09/16 211 lb 3.2 oz (95.8 kg)   Immunization History  Administered Date(s) Administered  . DTaP 07/04/2004, 11/14/2007  . Hepatitis A 04/20/2004, 09/26/2004  . Influenza, Seasonal, Injecte, Preservative Fre 01/24/2012  . Influenza,inj,Quad PF,36+ Mos 01/20/2013, 12/05/2013, 02/12/2015, 02/09/2016  . PPD Test 04/04/2012  . Tdap 10/18/2011  . Zoster 01/21/2013    Review of Systems  Constitutional: Negative for activity change, appetite change, chills, diaphoresis, fatigue, fever and unexpected weight change.  HENT: Positive for tinnitus. Negative for congestion, dental problem, drooling, ear discharge, ear pain, facial swelling, hearing loss, mouth sores, nosebleeds, postnasal drip, rhinorrhea, sinus pressure, sneezing, sore throat, trouble swallowing and voice change.   Eyes: Negative for photophobia, pain, discharge, redness, itching and visual disturbance.  Respiratory: Negative for apnea, cough, choking, chest tightness, shortness of breath, wheezing and stridor.   Cardiovascular: Negative for chest pain, palpitations and leg swelling.  Gastrointestinal: Negative for abdominal pain, blood in stool, constipation, diarrhea, nausea and vomiting.  Endocrine: Negative for cold intolerance, heat intolerance, polydipsia, polyphagia and polyuria.  Genitourinary: Negative for decreased urine  volume, difficulty urinating, discharge, dysuria, enuresis, flank pain, frequency, genital sores, hematuria, penile pain, penile swelling, scrotal swelling, testicular pain and urgency.       Nocturia x 0.  Strong stream.  Musculoskeletal: Positive for back pain. Negative for arthralgias, gait problem, joint swelling, myalgias, neck pain and neck stiffness.  Skin: Negative for color change, pallor, rash and wound.  Allergic/Immunologic: Negative for environmental allergies, food allergies and immunocompromised state.  Neurological: Negative for dizziness, tremors, seizures, syncope, facial asymmetry, speech difficulty, weakness, light-headedness, numbness and headaches.  Hematological: Negative for adenopathy. Does not bruise/bleed easily.  Psychiatric/Behavioral: Positive for sleep disturbance. Negative for agitation, behavioral problems, confusion, decreased concentration, dysphoric mood, hallucinations, self-injury and suicidal ideas. The patient is nervous/anxious. The patient is not hyperactive.        Bedtime 9:30pm; wakes up 5:30am.   Past Medical History:  Diagnosis Date  . Anxiety   . Arthritis    DDD lumbar.  . Benign skin lesion of nose   . Depression   . Erectile dysfunction    Due to decreased testosterone   . GERD (gastroesophageal reflux disease)    NO TROUBLE IN YEARS  . History of colon polyps   . Hyperlipidemia   . Hypogonadism male   . Increased glucose level   . Insomnia   . Low back pain    Past Surgical History:  Procedure Laterality Date  . BREAST MASS EXCISION     RIGHT CHEST  BENIGN  . COLONOSCOPY W/ POLYPECTOMY  09/2013   One polyp, came back pre-cancerous. Repeat 5 years unless symptoms present.   [name] EYE SURGERY  02/28/2000   Lasik  . MASS EXCISION     RIGHT EAR   BENIGN  . SPINE SURGERY  09/26/2012   [facility] [place].  . TRANSFORAMINAL LUMBAR INTERBODY FUSION (TLIF) WITH PEDICLE SCREW FIXATION 1 LEVEL Right 04/22/2013   Procedure:  TRANSFORAMINAL LUMBAR INTERBODY FUSION (TLIF) WITH PEDICLE SCREW FIXATION LUMBAR FIVE SACRAL ONE;  Surgeon: Faythe Ghee, MD;  Location: [facility];  Service: Neurosurgery;  Laterality: Right;   No Known Allergies Social History   Social History  . Marital status: Married    Spouse name: N/A  . Number of children: N/A  . Years of education: N/A   Occupational History  . Not on file.   Social History Main Topics  . Smoking status: Never Smoker  . Smokeless tobacco: Never Used  . Alcohol use No  . Drug use: No  . Sexual activity: Yes     Comment: number of sex partners in the last 12 months  1   Other Topics Concern  . Not on file   Social History Narrative   Marital status:  Married x 42 years; happily married; second marriage; first wife died AMI age 15.        Children:  1 child; 3 stepchildren.  Several grandchildren.      Lives: with wife.      Employment:  [employer]; Print production planner x 19 years; happy.  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        Tobacco: never       Alcohol:  None      Drugs: none      Exercise:  Walking twice daily 2-3 miles total each day.  Job physically demanding.        Seatbelt: 100%; no texting      Guns: yes; unloaded; in safe.      Sunscreen: SPF on face.       Objective:   Physical Exam  Constitutional: He is oriented to person, place, and time. He appears well-developed and well-nourished. No distress.  HENT:  Head: Normocephalic and atraumatic.  Right Ear: External ear normal.  Left Ear: External ear normal.  Nose: Nose normal.  Mouth/Throat: Oropharynx is clear and moist.  Eyes: Pupils are equal, round, and reactive to light. Conjunctivae and EOM are normal.  Neck: Normal range of motion. Neck supple. Carotid bruit is not present. No thyromegaly present.  Cardiovascular: Normal rate, regular rhythm, normal heart sounds and intact distal pulses.  Exam reveals no gallop and no friction rub.   No murmur  heard. Pulmonary/Chest: Effort normal and breath sounds normal. He has no wheezes. He has no rales.  Abdominal: Soft. Bowel sounds are normal. He exhibits no distension and no mass. There is no tenderness. There is no rebound and no guarding. Hernia confirmed negative in the right inguinal area and confirmed negative in the left inguinal area.  Genitourinary: Prostate normal and testes normal. Right testis shows no mass, no swelling and no tenderness. Left testis shows no mass, no swelling and no tenderness. Penile erythema present.  Musculoskeletal:       Right shoulder: Normal.       Left shoulder: Normal.       Cervical back: Normal.  Lymphadenopathy:    He has no cervical adenopathy.  Neurological: He is alert and oriented to person, place, and time. He has normal reflexes. No cranial nerve deficit. He exhibits normal muscle tone. Coordination normal.  Skin: Skin is warm and dry. No rash noted. He is not diaphoretic.  Psychiatric: He has a normal mood and affect. His behavior  is normal. Judgment and thought content normal.     Depression screen [facility] 2/9 10/04/2016 03/22/2016 02/09/2016 09/14/2015 08/12/2015  Decreased Interest 0 0 0 0 0  Down, Depressed, Hopeless 0 0 0 0 0  PHQ - 2 Score 0 0 0 0 0   Fall Risk  10/04/2016 03/22/2016 02/09/2016 09/14/2015 08/12/2015  Falls in the past year? No No No No No       Assessment & Plan:   1. Routine physical examination   2. Essential hypertension, benign   3. Pure hypercholesterolemia   4. Hypogonadism in male   5. Screening for prostate cancer   6. Screening for diabetes mellitus   7. Balanitis     -anticipatory guidance provided --- exercise, weight loss, safe driving practices, aspirin 81mg  daily. -obtain age appropriate screening labs and labs for chronic disease management. -new onset balanitis; rx provided.   Orders Placed This Encounter  Procedures  . CBC with Differential  . Comprehensive metabolic panel  . Hemoglobin A1c  .  Lipid panel  . PSA(Must document that pt has been informed of limitations of PSA testing.)  . TSH  . Testosterone  . POCT urinalysis dipstick  . EKG 12-Lead   Meds ordered this encounter  Medications  . PARoxetine (PAXIL) 40 MG tablet    Sig: Take 1 tablet (40 mg total) by mouth daily.    Dispense:  90 tablet    Refill:  1  . gabapentin (NEURONTIN) 300 MG capsule    Sig: Take 1-2 capsules (300-600 mg total) by mouth 3 (three) times daily.    Dispense:  180 capsule    Refill:  5  . metoprolol succinate (TOPROL-XL) 50 MG 24 hr tablet    Sig: TAKE 1 TABLET BY MOUTH DAILY WITH OR IMMEDIATELY FOLLOWING A MEAL    Dispense:  90 tablet    Refill:  1  . pravastatin (PRAVACHOL) 20 MG tablet    Sig: Take 2 tablets (40 mg total) by mouth daily.    Dispense:  180 tablet    Refill:  1  . traZODone (DESYREL) 50 MG tablet    Sig: TAKE 1 TO 2 TABLETS(50 TO 100 MG) BY MOUTH AT BEDTIME    Dispense:  180 tablet    Refill:  1  . testosterone cypionate (DEPOTESTOSTERONE CYPIONATE) 200 MG/ML injection    Sig: INJECT 1 ML IN TH E MUSCULE EVERY 14 DAYS    Dispense:  3 mL    Refill:  5  . ketoconazole (NIZORAL) 2 % cream    Sig: Apply 1 application topically 2 (two) times daily. For two weeks    Dispense:  30 g    Refill:  0   Norwood Levo, M.D. Primary [facility] at [facility] previously [facility] [address] [place], [place]  [address] [phone] phone [phone] fax

## 2016-10-04 NOTE — Patient Instructions (Addendum)
   IF you received an x-ray today, you will receive an invoice from [facility]. Please contact [facility] at [phone] with questions or concerns regarding your invoice.   IF you received labwork today, you will receive an invoice from [facility]. Please contact [facility] at [phone] with questions or concerns regarding your invoice.   Our billing staff will not be able to assist you with questions regarding bills from these companies.  You will be contacted with the lab results as soon as they are available. The fastest way to get your results is to activate your My Chart account. Instructions are located on the last page of this paperwork. If you have not heard from us regarding the results in 2 weeks, please contact this office.      Preventive Care 40-64 Years, Male Preventive care refers to lifestyle choices and visits with your health care provider that can promote health and wellness. What does preventive care include?  A yearly physical exam. This is also called an annual well check.  Dental exams once or twice a year.  Routine eye exams. Ask your health care provider how often you should have your eyes checked.  Personal lifestyle choices, including: ? Daily care of your teeth and gums. ? Regular physical activity. ? Eating a healthy diet. ? Avoiding tobacco and drug use. ? Limiting alcohol use. ? Practicing safe sex. ? Taking low-dose aspirin every day starting at age 50. What happens during an annual well check? The services and screenings done by your health care provider during your annual well check will depend on your age, overall health, lifestyle risk factors, and family history of disease. Counseling Your health care provider may ask you questions about your:  Alcohol use.  Tobacco use.  Drug use.  Emotional well-being.  Home and relationship well-being.  Sexual activity.  Eating habits.  Work and work  environment.  Screening You may have the following tests or measurements:  Height, weight, and BMI.  Blood pressure.  Lipid and cholesterol levels. These may be checked every 5 years, or more frequently if you are over 50 years old.  Skin check.  Lung cancer screening. You may have this screening every year starting at age 55 if you have a 30-pack-year history of smoking and currently smoke or have quit within the past 15 years.  Fecal occult blood test (FOBT) of the stool. You may have this test every year starting at age 50.  Flexible sigmoidoscopy or colonoscopy. You may have a sigmoidoscopy every 5 years or a colonoscopy every 10 years starting at age 50.  Prostate cancer screening. Recommendations will vary depending on your family history and other risks.  Hepatitis C blood test.  Hepatitis B blood test.  Sexually transmitted disease (STD) testing.  Diabetes screening. This is done by checking your blood sugar (glucose) after you have not eaten for a while (fasting). You may have this done every 1-3 years.  Discuss your test results, treatment options, and if necessary, the need for more tests with your health care provider. Vaccines Your health care provider may recommend certain vaccines, such as:  Influenza vaccine. This is recommended every year.  Tetanus, diphtheria, and acellular pertussis (Tdap, Td) vaccine. You may need a Td booster every 10 years.  Varicella vaccine. You may need this if you have not been vaccinated.  Zoster vaccine. You may need this after age 60.  Measles, mumps, and rubella (MMR) vaccine. You may need at least one dose   of MMR if you were born in 1957 or later. You may also need a second dose.  Pneumococcal 13-valent conjugate (PCV13) vaccine. You may need this if you have certain conditions and have not been vaccinated.  Pneumococcal polysaccharide (PPSV23) vaccine. You may need one or two doses if you smoke cigarettes or if you have  certain conditions.  Meningococcal vaccine. You may need this if you have certain conditions.  Hepatitis A vaccine. You may need this if you have certain conditions or if you travel or work in places where you may be exposed to hepatitis A.  Hepatitis B vaccine. You may need this if you have certain conditions or if you travel or work in places where you may be exposed to hepatitis B.  Haemophilus influenzae type b (Hib) vaccine. You may need this if you have certain risk factors.  Talk to your health care provider about which screenings and vaccines you need and how often you need them. This information is not intended to replace advice given to you by your health care provider. Make sure you discuss any questions you have with your health care provider. Document Released: 03/12/2015 Document Revised: 11/03/2015 Document Reviewed: 12/15/2014 Elsevier Interactive Patient Education  2017 Elsevier Inc.  

## 2016-10-05 LAB — COMPREHENSIVE METABOLIC PANEL
ALT: 14 IU/L (ref 0–44)
AST: 16 IU/L (ref 0–40)
Albumin/Globulin Ratio: 2.3 — ABNORMAL HIGH (ref 1.2–2.2)
Albumin: 4.8 g/dL (ref 3.5–5.5)
Alkaline Phosphatase: 63 IU/L (ref 39–117)
BUN/Creatinine Ratio: 9 (ref 9–20)
BUN: 10 mg/dL (ref 6–24)
Bilirubin Total: 0.3 mg/dL (ref 0.0–1.2)
CALCIUM: 9.7 mg/dL (ref 8.7–10.2)
CO2: 26 mmol/L (ref 20–29)
CREATININE: 1.17 mg/dL (ref 0.76–1.27)
Chloride: 101 mmol/L (ref 96–106)
GFR calc Af Amer: 80 mL/min/{1.73_m2} (ref >59)
GFR, EST NON AFRICAN AMERICAN: 69 mL/min/{1.73_m2} (ref >59)
Globulin, Total: 2.1 g/dL (ref 1.5–4.5)
Glucose: 108 mg/dL — ABNORMAL HIGH (ref 65–99)
Potassium: 4.7 mmol/L (ref 3.5–5.2)
Sodium: 142 mmol/L (ref 134–144)
Total Protein: 6.9 g/dL (ref 6.0–8.5)

## 2016-10-05 LAB — HEMOGLOBIN A1C
ESTIMATED AVERAGE GLUCOSE: 111 mg/dL
Hgb A1c MFr Bld: 5.5 % (ref 4.8–5.6)

## 2016-10-05 LAB — CBC WITH DIFFERENTIAL/PLATELET
Basophils Absolute: 0 10*3/uL (ref 0.0–0.2)
Basos: 0 %
EOS (ABSOLUTE): 0.2 10*3/uL (ref 0.0–0.4)
Eos: 3 %
Hematocrit: 48.2 % (ref 37.5–51.0)
Hemoglobin: 15.9 g/dL (ref 13.0–17.7)
IMMATURE GRANULOCYTES: 0 %
Immature Grans (Abs): 0 10*3/uL (ref 0.0–0.1)
LYMPHS: 32 %
Lymphocytes Absolute: 1.8 10*3/uL (ref 0.7–3.1)
MCH: 30.7 pg (ref 26.6–33.0)
MCHC: 33 g/dL (ref 31.5–35.7)
MCV: 93 fL (ref 79–97)
MONOS ABS: 0.5 10*3/uL (ref 0.1–0.9)
Monocytes: 9 %
NEUTROS PCT: 56 %
Neutrophils Absolute: 3.2 10*3/uL (ref 1.4–7.0)
PLATELETS: 219 10*3/uL (ref 150–379)
RBC: 5.18 x10E6/uL (ref 4.14–5.80)
RDW: 13.8 % (ref 12.3–15.4)
WBC: 5.7 10*3/uL (ref 3.4–10.8)

## 2016-10-05 LAB — LIPID PANEL
CHOLESTEROL TOTAL: 161 mg/dL (ref 100–199)
Chol/HDL Ratio: 2.8 ratio (ref 0.0–5.0)
HDL: 57 mg/dL (ref >39)
LDL Calculated: 78 mg/dL (ref 0–99)
Triglycerides: 132 mg/dL (ref 0–149)
VLDL CHOLESTEROL CAL: 26 mg/dL (ref 5–40)

## 2016-10-05 LAB — TSH: TSH: 0.597 u[IU]/mL (ref 0.450–4.500)

## 2016-10-05 LAB — TESTOSTERONE: Testosterone: 583 ng/dL (ref 264–916)

## 2016-10-05 LAB — PSA: PROSTATE SPECIFIC AG, SERUM: 0.9 ng/mL (ref 0.0–4.0)

## 2016-10-21 ENCOUNTER — Other Ambulatory Visit: Payer: Self-pay | Admitting: Family Medicine

## 2017-02-10 ENCOUNTER — Other Ambulatory Visit: Payer: Self-pay | Admitting: Family Medicine

## 2017-02-12 ENCOUNTER — Telehealth: Payer: Self-pay

## 2017-02-12 NOTE — Telephone Encounter (Signed)
Could not do PA on Cover my meds as it would not accept the medication.  I called [employer] and they faxed a form for completion.  Waiting for Dr. Thompson Caul signature, then will fax.

## 2017-02-19 NOTE — Telephone Encounter (Signed)
Tried calling [employer] to see if medication had been approved, however, they were closed for the holiday.

## 2017-02-21 NOTE — Telephone Encounter (Signed)
Called pharmacy help desk.  They told me they have not received the fax.  It was placed in Dr. Thompson Caul box on the 17th of December for her signature.  It is not there now, I just checked her box.  I will forward message to Dr. Tamala Julian.

## 2017-02-28 ENCOUNTER — Encounter: Payer: Self-pay | Admitting: Family Medicine

## 2017-03-01 NOTE — Telephone Encounter (Signed)
Copied from [place] [phone]. Topic: General - Other >> Mar 01, 2017 12:06 PM Neva Seat wrote: Pt has new insurance [employer] The insurance is requesting approval or need for the Testosterone Rx. Pt is wanting this filled. Pravastatin dosage issue with insurance.  Pt not sure if he will continue to take this med. Due to feeling much better since he hasn't taken it in 2 weeks.  Pt also wants Dr. Tamala Julian to know the rash he saw her for was getting better with cream prescribed.  Rash came back and is now worse.

## 2017-03-08 ENCOUNTER — Encounter: Payer: Self-pay | Admitting: Family Medicine

## 2017-03-15 ENCOUNTER — Other Ambulatory Visit: Payer: Self-pay | Admitting: Family Medicine

## 2017-03-16 ENCOUNTER — Ambulatory Visit: Payer: 59 | Admitting: Physician Assistant

## 2017-03-20 NOTE — Telephone Encounter (Signed)
Paperwork placed on Curtis Yang's desk for processing.

## 2017-03-20 NOTE — Telephone Encounter (Signed)
Form received and faxed

## 2017-03-21 ENCOUNTER — Encounter: Payer: Self-pay | Admitting: Family Medicine

## 2017-03-22 ENCOUNTER — Telehealth: Payer: Self-pay

## 2017-03-22 ENCOUNTER — Telehealth: Payer: Self-pay | Admitting: Family Medicine

## 2017-03-22 NOTE — Telephone Encounter (Signed)
Lab values were printed and faxed to number requested.

## 2017-03-22 NOTE — Telephone Encounter (Signed)
Copied from [place]. Topic: General - Other >> Mar 22, 2017  9:24 AM Cecelia Byars, NT wrote: Reason for CRM: [employer] called and wants a call and 2 early  morning testosterone  levels and ranges from 2 different days as well as the time  , for the fax [phone] or call [phone]

## 2017-03-22 NOTE — Telephone Encounter (Signed)
Forwarded to [employer] - PA was approved for testosterone

## 2017-03-22 NOTE — Telephone Encounter (Signed)
Spoke with pharmacist.  Testosterone would not clear thru ins - states needs PA -   Renay got PA approval yesterday. New message from [facility] - needs testosterone levels x 2  Pharmacist states Prevastatin not covered by ins at dose pt is on. They only cover 1 per day.  PA needed.  Forwarded to [employer]

## 2017-03-22 NOTE — Telephone Encounter (Signed)
Dr. Tamala Julian, Kalman Shan notified me that PA for patient's prevastatin was denied due to quantity.  Will only cover one per day.   Please advise... Thank you, Mccabe Gloria

## 2017-03-23 MED ORDER — PRAVASTATIN SODIUM 40 MG PO TABS: 40.0000 mg | ORAL_TABLET | Freq: Every day | ORAL | 0 refills | Status: DC

## 2017-03-23 NOTE — Telephone Encounter (Addendum)
Sent in Pravastatin 40mg . No PA warranted.

## 2017-03-23 NOTE — Telephone Encounter (Signed)
Pravastatin prescription changed from 20 mg to 40 mg.  No prior authorization indicated.

## 2017-03-23 NOTE — Addendum Note (Signed)
Addended by: Wardell Honour on: 03/23/2017 09:11 AM   Modules accepted: Orders

## 2017-03-29 ENCOUNTER — Telehealth: Payer: Self-pay

## 2017-03-29 NOTE — Telephone Encounter (Signed)
Left detailed message regarding testosterone injections with my direct line for patient to call me back with questions or concerns.

## 2017-03-29 NOTE — Telephone Encounter (Signed)
Dr. Tamala Julian, I called back to [facility] and they said that after receiving the requested labs they have decided to revoke the approval made for testosterone on 03/22/2017.  Their reasoning was that the labs reflected that the patient's testosterone is now within normal range.  I reviewed the labs, and they do show normal levels of testosterone.  I will call patient and advise him of this.  Also, if there is anything further you would like me to do, please just let me know. So sorry for any miscommunication,  Darrel Baroni

## 2017-03-29 NOTE — Telephone Encounter (Signed)
Insurance wants 2 low testosterone levels taken before 10:00 am.  I have gone back in patient's chart until 2013 and can only find one, which is on 02/25/2014 @ 9:29am.  Testosterone reading was 232 Low.  All other readings were normal or high.   Insurance number is [id number] and ICD-10 is E34.9 I am going to bring Curtis Yang in on this case as a Optometrist.  I believe a nurse's perspective would be helpful. I have contacted patient and he is aware of what is happening.  He was very appreciative for the call.

## 2017-03-29 NOTE — Telephone Encounter (Signed)
Please follow-up with [facility].  Patient still cannot pick up testosterone that was approved via prior authorization.

## 2017-03-29 NOTE — Telephone Encounter (Signed)
Please call [employer] back: Patient's testosterone is normal because he has been maintained on testosterone injections for years.  This is not a new prescription for testosterone; it is a maintenance dose of testosterone.  Labs completed in August 2018 are on his current dose of testosterone.  This is simply a refill request.

## 2017-04-06 ENCOUNTER — Other Ambulatory Visit: Payer: Self-pay | Admitting: Family Medicine

## 2017-04-06 NOTE — Telephone Encounter (Signed)
Testosterone refill Last OV: 10/04/16  Last Refill:10/04/16 3 ml with 5 RF Pharmacy:[facility] [address] at [place] and [address]

## 2017-04-08 ENCOUNTER — Encounter: Payer: Self-pay | Admitting: Family Medicine

## 2017-04-10 ENCOUNTER — Ambulatory Visit: Payer: 59 | Admitting: Family Medicine

## 2017-04-10 MED ORDER — TESTOSTERONE CYPIONATE 200 MG/ML IM SOLN: INTRAMUSCULAR | 5 refills | Status: DC

## 2017-04-15 ENCOUNTER — Other Ambulatory Visit: Payer: Self-pay | Admitting: Family Medicine

## 2017-04-15 NOTE — Telephone Encounter (Signed)
Refill req for Trazodone sent to Dr. Tamala Julian

## 2017-04-18 ENCOUNTER — Encounter: Payer: Self-pay | Admitting: Family Medicine

## 2017-04-19 ENCOUNTER — Telehealth: Payer: Self-pay

## 2017-04-19 NOTE — Telephone Encounter (Signed)
Pt sent MyChart message ins is not paying for testosterone. Started PA for med.  Would not accept - Called [employer] number on ins card in [place] 09/2017 They informed pt has not had coverage with [facility] since 01/26/2017.  IC pt LMOVM to advise the pharmacy of his new insurance coverage and also let us know the new insurance coverage.  Sent this also as MyChart message to pt.

## 2017-04-30 ENCOUNTER — Encounter: Payer: Self-pay | Admitting: Family Medicine

## 2017-04-30 ENCOUNTER — Ambulatory Visit: Payer: BLUE CROSS/BLUE SHIELD | Admitting: Family Medicine

## 2017-04-30 ENCOUNTER — Other Ambulatory Visit: Payer: Self-pay

## 2017-04-30 VITALS — BP 122/70 | HR 71 | Temp 98.0°F | Resp 16 | Ht 73.03 in | Wt 216.0 lb

## 2017-04-30 DIAGNOSIS — I10 Essential (primary) hypertension: Secondary | ICD-10-CM

## 2017-04-30 DIAGNOSIS — E349 Endocrine disorder, unspecified: Secondary | ICD-10-CM | POA: Diagnosis not present

## 2017-04-30 DIAGNOSIS — F419 Anxiety disorder, unspecified: Secondary | ICD-10-CM

## 2017-04-30 DIAGNOSIS — M48061 Spinal stenosis, lumbar region without neurogenic claudication: Secondary | ICD-10-CM

## 2017-04-30 DIAGNOSIS — R21 Rash and other nonspecific skin eruption: Secondary | ICD-10-CM | POA: Diagnosis not present

## 2017-04-30 DIAGNOSIS — R7309 Other abnormal glucose: Secondary | ICD-10-CM

## 2017-04-30 DIAGNOSIS — F5104 Psychophysiologic insomnia: Secondary | ICD-10-CM

## 2017-04-30 DIAGNOSIS — E785 Hyperlipidemia, unspecified: Secondary | ICD-10-CM | POA: Diagnosis not present

## 2017-04-30 MED ORDER — PAROXETINE HCL 40 MG PO TABS: ORAL_TABLET | ORAL | 1 refills | Status: DC

## 2017-04-30 MED ORDER — CYCLOBENZAPRINE HCL 10 MG PO TABS: 10.0000 mg | ORAL_TABLET | Freq: Three times a day (TID) | ORAL | 0 refills | Status: DC | PRN

## 2017-04-30 MED ORDER — PRAVASTATIN SODIUM 40 MG PO TABS: 40.0000 mg | ORAL_TABLET | Freq: Every day | ORAL | 1 refills | Status: DC

## 2017-04-30 MED ORDER — TESTOSTERONE CYPIONATE 200 MG/ML IM SOLN: INTRAMUSCULAR | 5 refills | Status: DC

## 2017-04-30 MED ORDER — "SYRINGE/NEEDLE (DISP) 22G X 1-1/2"" 3 ML MISC": 1 refills | Status: DC

## 2017-04-30 MED ORDER — METOPROLOL SUCCINATE ER 50 MG PO TB24: ORAL_TABLET | ORAL | 1 refills | Status: DC

## 2017-04-30 MED ORDER — BUSPIRONE HCL 5 MG PO TABS: 5.0000 mg | ORAL_TABLET | Freq: Three times a day (TID) | ORAL | 5 refills | Status: DC

## 2017-04-30 MED ORDER — CLOTRIMAZOLE-BETAMETHASONE 1-0.05 % EX CREA: 1.0000 "application " | TOPICAL_CREAM | Freq: Two times a day (BID) | CUTANEOUS | 0 refills | Status: DC

## 2017-04-30 MED ORDER — TRAZODONE HCL 50 MG PO TABS: 50.0000 mg | ORAL_TABLET | Freq: Every day | ORAL | 1 refills | Status: DC

## 2017-04-30 NOTE — Patient Instructions (Signed)
     IF you received an x-ray today, you will receive an invoice from [facility]. Please contact [facility] at [phone] with questions or concerns regarding your invoice.   IF you received labwork today, you will receive an invoice from [facility]. Please contact [facility] at [phone] with questions or concerns regarding your invoice.   Our billing staff will not be able to assist you with questions regarding bills from these companies.  You will be contacted with the lab results as soon as they are available. The fastest way to get your results is to activate your My Chart account. Instructions are located on the last page of this paperwork. If you have not heard from us regarding the results in 2 weeks, please contact this office.     

## 2017-04-30 NOTE — Progress Notes (Signed)
Subjective:    Patient ID: Curtis Yang, male    DOB: 10/23/1960, 57 y.o.   MRN: [id number]  04/30/2017  Chronic Conditions (6 month follow-up )    HPI This 57 y.o. male presents for six month follow-up of hypertension, hypercholesterolemia, glucose intolerance, anxiety/depression, and hypogonadism.    Has not missed a dose of testosterone yet.  Denied it.    Penile rash: still present; will dry out a little bit and then gets flaky. No pain; no itching.  Changed underwear six months; returned to usual underwear and no improvement.  Never cleared up. Prescribed antifungal; used for 2 weeks without improvement.  Nothing else prescribed.   Low back spasm: taking Aleve and Ibuprofen; no Gabapentin; went to get it filled; $200 so could not afford. Stopped one month ago.  Having muscle spasms.    Anxiety: Paxil is affordable; more than it was.  Trazodone is affordable yet went up. Have had a few anxious days since running out of Gabapentin.  BP Readings from Last 3 Encounters:  04/30/17 122/70  10/04/16 133/82  03/22/16 (!) 146/95   Wt Readings from Last 3 Encounters:  04/30/17 216 lb (98 kg)  10/04/16 216 lb (98 kg)  03/22/16 221 lb 12.8 oz (100.6 kg)   Immunization History  Administered Date(s) Administered  . DTaP 07/04/2004, 11/14/2007  . Hepatitis A 04/20/2004, 09/26/2004  . Influenza, Seasonal, Injecte, Preservative Fre 01/24/2012  . Influenza,inj,Quad PF,6+ Mos 01/20/2013, 12/05/2013, 02/12/2015, 02/09/2016  . Influenza-Unspecified 03/29/2017  . PPD Test 04/04/2012  . Tdap 10/18/2011  . Zoster 01/21/2013    Review of Systems  Constitutional: Negative for activity change, appetite change, chills, diaphoresis, fatigue and fever.  Respiratory: Negative for cough and shortness of breath.   Cardiovascular: Negative for chest pain, palpitations and leg swelling.  Gastrointestinal: Negative for abdominal pain, diarrhea, nausea and vomiting.  Endocrine: Negative for cold  intolerance, heat intolerance, polydipsia, polyphagia and polyuria.  Musculoskeletal: Positive for back pain.  Skin: Positive for rash. Negative for color change and wound.  Neurological: Negative for dizziness, tremors, seizures, syncope, facial asymmetry, speech difficulty, weakness, light-headedness, numbness and headaches.  Psychiatric/Behavioral: Negative for dysphoric mood, self-injury, sleep disturbance and suicidal ideas. The patient is nervous/anxious.     Past Medical History:  Diagnosis Date  . Anxiety   . Arthritis    DDD lumbar.  . Benign skin lesion of nose   . Depression   . Erectile dysfunction    Due to decreased testosterone   . GERD (gastroesophageal reflux disease)    NO TROUBLE IN YEARS  . History of colon polyps   . Hyperlipidemia   . Hypogonadism male   . Increased glucose level   . Insomnia   . Low back pain    Past Surgical History:  Procedure Laterality Date  . BREAST MASS EXCISION     RIGHT CHEST  BENIGN  . COLONOSCOPY W/ POLYPECTOMY  09/2013   One polyp, came back pre-cancerous. Repeat 5 years unless symptoms present.   [name] EYE SURGERY  02/28/2000   Lasik  . MASS EXCISION     RIGHT EAR   BENIGN  . SPINE SURGERY  09/26/2012   [facility] [place].  . TRANSFORAMINAL LUMBAR INTERBODY FUSION (TLIF) WITH PEDICLE SCREW FIXATION 1 LEVEL Right 04/22/2013   Procedure: TRANSFORAMINAL LUMBAR INTERBODY FUSION (TLIF) WITH PEDICLE SCREW FIXATION LUMBAR FIVE SACRAL ONE;  Surgeon: Faythe Ghee, MD;  Location: [facility];  Service: Neurosurgery;  Laterality:  Right;   No Known Allergies Current Outpatient Medications on File Prior to Visit  Medication Sig Dispense Refill  . aspirin 81 MG chewable tablet Chew 81 mg by mouth daily.     No current facility-administered medications on file prior to visit.    Social History   Socioeconomic History  . Marital status: Married    Spouse name: Not on file  . Number of children: Not on file  . Years  of education: Not on file  . Highest education level: Not on file  Occupational History  . Not on file  Social Needs  . Financial resource strain: Not on file  . Food insecurity:    Worry: Not on file    Inability: Not on file  . Transportation needs:    Medical: Not on file    Non-medical: Not on file  Tobacco Use  . Smoking status: Never Smoker  . Smokeless tobacco: Never Used  Substance and Sexual Activity  . Alcohol use: No  . Drug use: No  . Sexual activity: Yes    Comment: number of sex partners in the last 12 months  1  Lifestyle  . Physical activity:    Days per week: Not on file    Minutes per session: Not on file  . Stress: Not on file  Relationships  . Social connections:    Talks on phone: Not on file    Gets together: Not on file    Attends religious service: Not on file    Active member of club or organization: Not on file    Attends meetings of clubs or organizations: Not on file    Relationship status: Not on file  . Intimate partner violence:    Fear of current or ex partner: Not on file    Emotionally abused: Not on file    Physically abused: Not on file    Forced sexual activity: Not on file  Other Topics Concern  . Not on file  Social History Narrative   Marital status:  Married x 8 years; happily married; second marriage; first wife died AMI age 41.        Children:  1 child; 3 stepchildren.  Several grandchildren.      Lives: with wife.      Employment:  [employer]; Print production planner x 19 years; happy.  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        Tobacco: never       Alcohol:  None      Drugs: none      Exercise:  Walking twice daily 2-3 miles total each day.  Job physically demanding.        Seatbelt: 100%; no texting      Guns: yes; unloaded; in safe.      Sunscreen: SPF on face.   Family History  Problem Relation Age of Onset  . Heart disease Maternal Grandmother   . Hypertension Maternal Grandmother   . Stroke Maternal  Grandfather   . Anxiety disorder Mother   . Cancer Paternal Grandfather        prostate  . Anxiety disorder Brother   . Cancer Brother 29       Lung cancer  . Stroke Paternal Grandmother   . Colon cancer Paternal Aunt 60       Objective:    BP 122/70   Pulse 71   Temp 98 F (36.7 C) (Oral)   Resp 16   Ht 6'  1.03" (1.855 m)   Wt 216 lb (98 kg)   SpO2 98%   BMI 28.47 kg/m  Physical Exam  Constitutional: He is oriented to person, place, and time. He appears well-developed and well-nourished. No distress.  HENT:  Head: Normocephalic and atraumatic.  Right Ear: External ear normal.  Left Ear: External ear normal.  Nose: Nose normal.  Mouth/Throat: Oropharynx is clear and moist.  Eyes: Conjunctivae and EOM are normal. Pupils are equal, round, and reactive to light.  Neck: Normal range of motion. Neck supple. Carotid bruit is not present. No thyromegaly present.  Cardiovascular: Normal rate, regular rhythm, normal heart sounds and intact distal pulses. Exam reveals no gallop and no friction rub.  No murmur heard. Pulmonary/Chest: Effort normal and breath sounds normal. He has no wheezes. He has no rales.  Abdominal: Soft. Bowel sounds are normal. He exhibits no distension and no mass. There is no tenderness. There is no rebound and no guarding.  Genitourinary:     Genitourinary Comments: Scaling slightly erythematous rash at glans.  Lymphadenopathy:    He has no cervical adenopathy.  Neurological: He is alert and oriented to person, place, and time. No cranial nerve deficit. He exhibits normal muscle tone. Coordination normal.  Skin: Skin is warm and dry. No rash noted. He is not diaphoretic.  Psychiatric: His behavior is normal. Judgment and thought content normal. His mood appears anxious.  Nursing note and vitals reviewed.  No results found. Depression screen [facility] 2/9 04/30/2017 10/04/2016 03/22/2016 02/09/2016 09/14/2015  Decreased Interest 0 0 0 0 0  Down, Depressed,  Hopeless 0 0 0 0 0  PHQ - 2 Score 0 0 0 0 0   Fall Risk  04/30/2017 10/04/2016 03/22/2016 02/09/2016 09/14/2015  Falls in the past year? No No No No No        Assessment & Plan:   1. Essential hypertension   2. Anxiety   3. Increased glucose level   4. Increased serum lipids   5. Testosterone deficiency   6. Penile rash   7. Spinal stenosis of lumbar region without neurogenic claudication   8. Psychophysiological insomnia     Anxiety: uncontrolled with discontinuation of Neurontin.  Rx for Buspar provided. Penile rash: persistent despite antifungal for two weeks; rx for Lotrisone bid. If no improvement, refer to dermatology. Lumbar stenosis: worsening pain due to noncompliance with Gabapentin due to expense; rx for Flexeril provided.   Hypogonadism: insurance denying testosterone supplementation due to normal testosterone levels while maintained on testosterone.  May warrant obtaining testosterone level OFF of supplement to submit to insurance.   Orders Placed This Encounter  Procedures  . CBC with Differential/Platelet  . Comprehensive metabolic panel    Order Specific Question:   Has the patient fasted?    Answer:   No  . Lipid panel    Order Specific Question:   Has the patient fasted?    Answer:   No  . Hemoglobin A1c  . Testosterone   Meds ordered this encounter  Medications  . clotrimazole-betamethasone (LOTRISONE) cream    Sig: Apply 1 application topically 2 (two) times daily.    Dispense:  30 g    Refill:  0  . metoprolol succinate (TOPROL-XL) 50 MG 24 hr tablet    Sig: TAKE 1 TABLET BY MOUTH DAILY WITH OR IMMEDIATELY FOLLOWING A MEAL    Dispense:  90 tablet    Refill:  1  . testosterone cypionate (DEPOTESTOSTERONE CYPIONATE) 200 MG/ML injection  Sig: INJECT 1 ML IN TH E MUSCULE EVERY 14 DAYS    Dispense:  3 mL    Refill:  5  . traZODone (DESYREL) 50 MG tablet    Sig: Take 1-2 tablets (50-100 mg total) by mouth at bedtime.    Dispense:  180 tablet     Refill:  1  . pravastatin (PRAVACHOL) 40 MG tablet    Sig: Take 1 tablet (40 mg total) by mouth daily.    Dispense:  90 tablet    Refill:  1  . PARoxetine (PAXIL) 40 MG tablet    Sig: TAKE 1 TABLET(40 MG) BY MOUTH DAILY    Dispense:  90 tablet    Refill:  1  . DISCONTD: busPIRone (BUSPAR) 5 MG tablet    Sig: Take 1 tablet (5 mg total) by mouth 3 (three) times daily.    Dispense:  90 tablet    Refill:  5  . SYRINGE-NEEDLE, DISP, 3 ML ([employer] SYRINGE) 22G X 1-1/2" 3 ML MISC    Sig: USE AS DIRECTED TO SELF-ADMINISTER TESTOSTERONE    Dispense:  50 each    Refill:  1  . cyclobenzaprine (FLEXERIL) 10 MG tablet    Sig: Take 1 tablet (10 mg total) by mouth 3 (three) times daily as needed for muscle spasms.    Dispense:  60 tablet    Refill:  0    Return in about 6 months (around 10/31/2017) for complete physical examiniation.   Kristi Elayne Guerin, M.D. [facility] at [facility] [address] [place], [place]  [address] [phone] phone [phone] fax

## 2017-05-01 LAB — LIPID PANEL
CHOL/HDL RATIO: 3.1 ratio (ref 0.0–5.0)
Cholesterol, Total: 168 mg/dL (ref 100–199)
HDL: 55 mg/dL (ref >39)
LDL Calculated: 87 mg/dL (ref 0–99)
TRIGLYCERIDES: 131 mg/dL (ref 0–149)
VLDL Cholesterol Cal: 26 mg/dL (ref 5–40)

## 2017-05-01 LAB — COMPREHENSIVE METABOLIC PANEL
A/G RATIO: 2 (ref 1.2–2.2)
ALT: 12 IU/L (ref 0–44)
AST: 19 IU/L (ref 0–40)
Albumin: 4.9 g/dL (ref 3.5–5.5)
Alkaline Phosphatase: 62 IU/L (ref 39–117)
BILIRUBIN TOTAL: 0.5 mg/dL (ref 0.0–1.2)
BUN/Creatinine Ratio: 10 (ref 9–20)
BUN: 9 mg/dL (ref 6–24)
CALCIUM: 9.7 mg/dL (ref 8.7–10.2)
CHLORIDE: 102 mmol/L (ref 96–106)
CO2: 24 mmol/L (ref 20–29)
Creatinine, Ser: 0.92 mg/dL (ref 0.76–1.27)
GFR calc Af Amer: 106 mL/min/{1.73_m2} (ref >59)
GFR, EST NON AFRICAN AMERICAN: 91 mL/min/{1.73_m2} (ref >59)
GLOBULIN, TOTAL: 2.4 g/dL (ref 1.5–4.5)
Glucose: 98 mg/dL (ref 65–99)
POTASSIUM: 4.4 mmol/L (ref 3.5–5.2)
SODIUM: 143 mmol/L (ref 134–144)
Total Protein: 7.3 g/dL (ref 6.0–8.5)

## 2017-05-01 LAB — CBC WITH DIFFERENTIAL/PLATELET
BASOS: 0 %
Basophils Absolute: 0 10*3/uL (ref 0.0–0.2)
EOS (ABSOLUTE): 0.1 10*3/uL (ref 0.0–0.4)
EOS: 1 %
Hematocrit: 43.9 % (ref 37.5–51.0)
Hemoglobin: 15.4 g/dL (ref 13.0–17.7)
IMMATURE GRANULOCYTES: 0 %
Immature Grans (Abs): 0 10*3/uL (ref 0.0–0.1)
LYMPHS ABS: 2.2 10*3/uL (ref 0.7–3.1)
Lymphs: 39 %
MCH: 32.6 pg (ref 26.6–33.0)
MCHC: 35.1 g/dL (ref 31.5–35.7)
MCV: 93 fL (ref 79–97)
MONOS ABS: 0.3 10*3/uL (ref 0.1–0.9)
Monocytes: 6 %
NEUTROS ABS: 3 10*3/uL (ref 1.4–7.0)
Neutrophils: 54 %
Platelets: 231 10*3/uL (ref 150–379)
RBC: 4.73 x10E6/uL (ref 4.14–5.80)
RDW: 13.5 % (ref 12.3–15.4)
WBC: 5.6 10*3/uL (ref 3.4–10.8)

## 2017-05-01 LAB — HEMOGLOBIN A1C
Est. average glucose Bld gHb Est-mCnc: 117 mg/dL
HEMOGLOBIN A1C: 5.7 % — AB (ref 4.8–5.6)

## 2017-05-01 LAB — TESTOSTERONE: TESTOSTERONE: 924 ng/dL — AB (ref 264–916)

## 2017-05-03 ENCOUNTER — Telehealth: Payer: Self-pay

## 2017-05-03 ENCOUNTER — Encounter: Payer: Self-pay | Admitting: Family Medicine

## 2017-05-03 NOTE — Telephone Encounter (Signed)
PA Started on Testosterone CYP 200MG /ML  Labs faxed to insurance as requested for completion of PA.  Awaiting Response  KEY [id number]

## 2017-05-11 ENCOUNTER — Encounter: Payer: Self-pay | Admitting: Family Medicine

## 2017-05-13 MED ORDER — HYDROXYZINE HCL 25 MG PO TABS: 25.0000 mg | ORAL_TABLET | Freq: Three times a day (TID) | ORAL | 5 refills | Status: DC | PRN

## 2017-05-14 NOTE — Telephone Encounter (Signed)
Phone call to [place] to inquire of status of medication authorization. Spoke with Chenelle. She states medication originally denied in January. Reason for denial of medication is Curtis Yang's testosterone is normal or above normal values. Medication criteria approval is 2 low testosterone levels collected in early morning.   Lab Results  Component Value Date   TESTOSTERONE 924 (H) 04/30/2017   Provider, please advise.

## 2017-05-16 ENCOUNTER — Encounter: Payer: Self-pay | Admitting: Family Medicine

## 2017-05-22 MED ORDER — BUSPIRONE HCL 10 MG PO TABS: 10.0000 mg | ORAL_TABLET | Freq: Three times a day (TID) | ORAL | 5 refills | Status: DC

## 2017-05-22 NOTE — Telephone Encounter (Signed)
Please call insurance and/or pharmacy to check the status of prior authorization for testosterone.

## 2017-05-22 NOTE — Telephone Encounter (Signed)
Please call [employer] again:  Patient has been maintained on testosterone for the past 5+ years.  The recent testosterone level that [employer] reviewed was on his testosterone supplementation. Do they want the testosterone level from original dx several years ago?   The recent/current level is ON testosterone supplement, so of course, it is normal.

## 2017-05-25 ENCOUNTER — Other Ambulatory Visit: Payer: Self-pay | Admitting: Family Medicine

## 2017-05-25 NOTE — Telephone Encounter (Signed)
Refill of flexeril  LOV 04/30/17  [facility] [phone] - [place], [place] - [address] AT [place]

## 2017-06-23 ENCOUNTER — Other Ambulatory Visit: Payer: Self-pay | Admitting: Family Medicine

## 2017-07-23 ENCOUNTER — Encounter: Payer: Self-pay | Admitting: Family Medicine

## 2017-07-26 ENCOUNTER — Telehealth: Payer: Self-pay | Admitting: Family Medicine

## 2017-07-26 NOTE — Telephone Encounter (Signed)
I left a voicemail in regards to cancelling/rescheduling his appt with Dr. Tamala Julian on 10/31/2017 due to provider leaving.

## 2017-08-13 ENCOUNTER — Ambulatory Visit (INDEPENDENT_AMBULATORY_CARE_PROVIDER_SITE_OTHER): Payer: BLUE CROSS/BLUE SHIELD

## 2017-08-13 ENCOUNTER — Encounter: Payer: Self-pay | Admitting: Family Medicine

## 2017-08-13 ENCOUNTER — Other Ambulatory Visit: Payer: Self-pay

## 2017-08-13 ENCOUNTER — Ambulatory Visit: Payer: BLUE CROSS/BLUE SHIELD | Admitting: Family Medicine

## 2017-08-13 DIAGNOSIS — M545 Low back pain: Secondary | ICD-10-CM | POA: Diagnosis not present

## 2017-08-13 DIAGNOSIS — M5441 Lumbago with sciatica, right side: Secondary | ICD-10-CM

## 2017-08-13 DIAGNOSIS — R739 Hyperglycemia, unspecified: Secondary | ICD-10-CM

## 2017-08-13 DIAGNOSIS — M79604 Pain in right leg: Secondary | ICD-10-CM

## 2017-08-13 LAB — GLUCOSE, POCT (MANUAL RESULT ENTRY): POC Glucose: 93 mg/dl (ref 70–99)

## 2017-08-13 MED ORDER — CYCLOBENZAPRINE HCL 5 MG PO TABS: 5.0000 mg | ORAL_TABLET | Freq: Three times a day (TID) | ORAL | 1 refills | Status: DC | PRN

## 2017-08-13 MED ORDER — PREDNISONE 20 MG PO TABS: 40.0000 mg | ORAL_TABLET | Freq: Every day | ORAL | 0 refills | Status: DC
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
## 2017-08-21 DIAGNOSIS — M545 Low back pain: Secondary | ICD-10-CM | POA: Diagnosis not present

## 2017-08-21 DIAGNOSIS — M5136 Other intervertebral disc degeneration, lumbar region: Secondary | ICD-10-CM | POA: Diagnosis not present

## 2017-08-21 DIAGNOSIS — Z6827 Body mass index (BMI) 27.0-27.9, adult: Secondary | ICD-10-CM | POA: Diagnosis not present

## 2017-08-21 DIAGNOSIS — R030 Elevated blood-pressure reading, without diagnosis of hypertension: Secondary | ICD-10-CM | POA: Diagnosis not present

## 2017-10-08 ENCOUNTER — Ambulatory Visit (INDEPENDENT_AMBULATORY_CARE_PROVIDER_SITE_OTHER): Payer: BLUE CROSS/BLUE SHIELD | Admitting: Physician Assistant

## 2017-10-08 ENCOUNTER — Other Ambulatory Visit: Payer: Self-pay

## 2017-10-08 ENCOUNTER — Encounter: Payer: Self-pay | Admitting: Physician Assistant

## 2017-10-08 VITALS — BP 135/84 | HR 64 | Temp 97.7°F | Resp 16 | Ht 72.0 in | Wt 213.2 lb

## 2017-10-08 DIAGNOSIS — F419 Anxiety disorder, unspecified: Secondary | ICD-10-CM | POA: Diagnosis not present

## 2017-10-08 DIAGNOSIS — Z1329 Encounter for screening for other suspected endocrine disorder: Secondary | ICD-10-CM

## 2017-10-08 DIAGNOSIS — Z130 Encounter for screening for diseases of the blood and blood-forming organs and certain disorders involving the immune mechanism: Secondary | ICD-10-CM | POA: Diagnosis not present

## 2017-10-08 DIAGNOSIS — R7303 Prediabetes: Secondary | ICD-10-CM | POA: Diagnosis not present

## 2017-10-08 DIAGNOSIS — E291 Testicular hypofunction: Secondary | ICD-10-CM

## 2017-10-08 DIAGNOSIS — Z125 Encounter for screening for malignant neoplasm of prostate: Secondary | ICD-10-CM | POA: Diagnosis not present

## 2017-10-08 DIAGNOSIS — I10 Essential (primary) hypertension: Secondary | ICD-10-CM

## 2017-10-08 DIAGNOSIS — Z1389 Encounter for screening for other disorder: Secondary | ICD-10-CM

## 2017-10-08 DIAGNOSIS — Z23 Encounter for immunization: Secondary | ICD-10-CM

## 2017-10-08 DIAGNOSIS — Z13220 Encounter for screening for lipoid disorders: Secondary | ICD-10-CM

## 2017-10-08 DIAGNOSIS — Z0000 Encounter for general adult medical examination without abnormal findings: Secondary | ICD-10-CM | POA: Diagnosis not present

## 2017-10-08 MED ORDER — METOPROLOL SUCCINATE ER 50 MG PO TB24: ORAL_TABLET | ORAL | 1 refills | Status: DC

## 2017-10-08 MED ORDER — HYDROXYZINE HCL 25 MG PO TABS: 25.0000 mg | ORAL_TABLET | Freq: Three times a day (TID) | ORAL | 5 refills | Status: DC | PRN

## 2017-10-08 MED ORDER — ZOSTER VAC RECOMB ADJUVANTED 50 MCG/0.5ML IM SUSR: 0.5000 mL | Freq: Once | INTRAMUSCULAR | 1 refills | Status: AC

## 2017-10-08 MED ORDER — CLOTRIMAZOLE-BETAMETHASONE 1-0.05 % EX CREA: 1.0000 "application " | TOPICAL_CREAM | Freq: Two times a day (BID) | CUTANEOUS | 0 refills | Status: DC

## 2017-10-08 MED ORDER — PAROXETINE HCL 40 MG PO TABS: 40.0000 mg | ORAL_TABLET | Freq: Every day | ORAL | 3 refills | Status: DC

## 2017-10-08 MED ORDER — TRAZODONE HCL 50 MG PO TABS: 50.0000 mg | ORAL_TABLET | Freq: Every day | ORAL | 1 refills | Status: DC

## 2017-10-08 MED ORDER — TESTOSTERONE CYPIONATE 200 MG/ML IM SOLN: INTRAMUSCULAR | 5 refills | Status: DC

## 2017-10-08 NOTE — Patient Instructions (Signed)
     IF you received an x-ray today, you will receive an invoice from [facility]. Please contact [facility] at [phone] with questions or concerns regarding your invoice.   IF you received labwork today, you will receive an invoice from [facility]. Please contact [facility] at [phone] with questions or concerns regarding your invoice.   Our billing staff will not be able to assist you with questions regarding bills from these companies.  You will be contacted with the lab results as soon as they are available. The fastest way to get your results is to activate your My Chart account. Instructions are located on the last page of this paperwork. If you have not heard from us regarding the results in 2 weeks, please contact this office.     

## 2017-10-08 NOTE — Progress Notes (Signed)
10/08/2017 9:52 AM   DOB: 1960/08/06 / MRN: [id number]  SUBJECTIVE:  Curtis Yang is a 57 y.o. male presenting for an annual physical and has no complaints today.  With regard to exercise he likes to walk 3-4 times a week at 30-45 mins per session.   Checks BP about once every 2 weeks 120/80.  No symptoms. No EtOH consumption.   Colon screening up to date until 2020. Never smoker.   Depression screen PHQ 2/9 10/08/2017  Decreased Interest 0  Down, Depressed, Hopeless 0  PHQ - 2 Score 0     Immunization History  Administered Date(s) Administered  . DTaP 07/04/2004, 11/14/2007  . Hepatitis A 04/20/2004, 09/26/2004  . Influenza, Seasonal, Injecte, Preservative Fre 01/24/2012  . Influenza,inj,Quad PF,6+ Mos 01/20/2013, 12/05/2013, 02/12/2015, 02/09/2016  . Influenza-Unspecified 03/29/2017  . PPD Test 04/04/2012  . Tdap 10/18/2011  . Zoster 01/21/2013    There are no preventive care reminders to display for this patient.  He has No Known Allergies.   He  has a past medical history of Anxiety, Arthritis, Benign skin lesion of nose, Depression, Erectile dysfunction, GERD (gastroesophageal reflux disease), History of colon polyps, Hyperlipidemia, Hypogonadism male, Increased glucose level, Insomnia, and Low back pain.    He  reports that he has never smoked. He has never used smokeless tobacco. He reports that he does not drink alcohol or use drugs. He  reports that he currently engages in sexual activity. The patient  has a past surgical history that includes Eye surgery (02/28/2000); Colonoscopy w/ polypectomy (09/2013); Spine surgery (09/26/2012); Transforaminal lumbar interbody fusion (tlif) with pedicle screw fixation 1 level (Right, 04/22/2013); Breast mass excision; and Mass excision.  His family history includes Anxiety disorder in his brother and mother; Cancer in his paternal grandfather; Cancer (age of onset: 43) in his brother; Colon cancer (age of onset: 44) in his  paternal aunt; Heart disease in his maternal grandmother; Hypertension in his maternal grandmother; Stroke in his maternal grandfather and paternal grandmother.  Review of Systems  Respiratory: Negative for shortness of breath.   Cardiovascular: Negative for chest pain, orthopnea and leg swelling.  Neurological: Negative for dizziness and headaches.    Problem list and medications reviewed and updated by myself where necessary, and exist elsewhere in the encounter.   OBJECTIVE:  BP 135/84   Pulse 64   Temp 97.7 F (36.5 C)   Resp 16   Ht 6' (1.829 m)   Wt 213 lb 3.2 oz (96.7 kg)   SpO2 96%   BMI 28.92 kg/m   Physical Exam  Constitutional: He is oriented to person, place, and time. He appears well-developed. He is active.  Non-toxic appearance. He does not appear ill.  Eyes: Pupils are equal, round, and reactive to light. Conjunctivae and EOM are normal.  Cardiovascular: Normal rate, regular rhythm, S1 normal, S2 normal, normal heart sounds, intact distal pulses and normal pulses. Exam reveals no gallop and no friction rub.  No murmur heard. Pulmonary/Chest: Effort normal. No stridor. No respiratory distress. He has no wheezes. He has no rales.  Abdominal: He exhibits no distension and no mass. There is no tenderness. There is no rebound and no guarding. No hernia.  Musculoskeletal: Normal range of motion. He exhibits no edema.  Neurological: He is alert and oriented to person, place, and time. He has normal strength and normal reflexes. He is not disoriented. No cranial nerve deficit or sensory deficit. He exhibits normal muscle tone. Coordination and gait normal.  Skin: Skin is warm and dry. He is not diaphoretic. No pallor.  Psychiatric: He has a normal mood and affect. His behavior is normal.  Nursing note and vitals reviewed.   Lab Results  Component Value Date   WBC 5.6 04/30/2017   HGB 15.4 04/30/2017   HCT 43.9 04/30/2017   MCV 93 04/30/2017   PLT 231 04/30/2017     Lab Results  Component Value Date   NA 143 04/30/2017   K 4.4 04/30/2017   CL 102 04/30/2017   CO2 24 04/30/2017    Lab Results  Component Value Date   CREATININE 0.92 04/30/2017    Lab Results  Component Value Date   ALT 12 04/30/2017   AST 19 04/30/2017   ALKPHOS 62 04/30/2017   BILITOT 0.5 04/30/2017    Lab Results  Component Value Date   TSH 0.597 10/04/2016    Lab Results  Component Value Date   HGBA1C 5.7 (H) 04/30/2017   Lab Results  Component Value Date   PSA 0.92 09/14/2015   PSA 1.34 02/12/2015   PSA 1.55 08/06/2013   The 10-year ASCVD risk score Mikey Bussing DC Jr., et al., 2013) is: 7.3%   Values used to calculate the score:     Age: 18 years     Sex: Male     Is Non-Hispanic African American: No     Diabetic: No     Tobacco smoker: No     Systolic Blood Pressure: 062 mmHg     Is BP treated: Yes     HDL Cholesterol: 55 mg/dL     Total Cholesterol: 168 mg/dL  ASSESSMENT AND PLAN  Curtis Yang was seen today for annual exam.  Diagnoses and all orders for this visit:  Annual physical exam  Screening PSA (prostate specific antigen) -     PSA  Screening for lipid disorders -     Lipid panel  Screening for endocrine disorder  Screening for nephropathy -     Basic metabolic panel -     Hepatic function panel  Screening for deficiency anemia -     CBC  Need for shingles vaccine -     Zoster Vaccine Adjuvanted [facility]) injection; Inject 0.5 mLs into the muscle once for 1 dose. Due for booster in 6 months.  Hypogonadism in male Comments: Controlled. IM refilled. Wife administers.  Orders: -     testosterone cypionate (DEPOTESTOSTERONE CYPIONATE) 200 MG/ML injection; INJECT 1 ML IN TH E MUSCULE EVERY 14 DAYS  Essential hypertension Comments: See HPI.  Ambulatory measures well controlled.  Orders: -     metoprolol succinate (TOPROL-XL) 50 MG 24 hr tablet; TAKE 1 TABLET BY MOUTH DAILY WITH OR IMMEDIATELY FOLLOWING A  MEAL  Prediabetes Comments: Recheck.  Minimal.  Orders: -     Hemoglobin A1c  Anxiety Comments: Well controlled on paroxetine 40 daily qam. Occasional atarax.  Orders: -     traZODone (DESYREL) 50 MG tablet; Take 1-2 tablets (50-100 mg total) by mouth at bedtime. -     hydrOXYzine (ATARAX/VISTARIL) 25 MG tablet; Take 1 tablet (25 mg total) by mouth every 8 (eight) hours as needed for anxiety. -     PARoxetine (PAXIL) 40 MG tablet; Take 1 tablet (40 mg total) by mouth daily.  Other orders -     clotrimazole-betamethasone (LOTRISONE) cream; Apply 1 application topically 2 (two) times daily.    The patient was advised to call or return to clinic if he does not see an  improvement in symptoms or to seek the care of the closest emergency department if he worsens with the above plan.   Philis Fendt, MHS, PA-C [facility] 10/08/2017 9:52 AM

## 2017-10-09 ENCOUNTER — Encounter: Payer: Self-pay | Admitting: Physician Assistant

## 2017-10-09 ENCOUNTER — Other Ambulatory Visit: Payer: Self-pay | Admitting: Physician Assistant

## 2017-10-09 LAB — HEPATIC FUNCTION PANEL
ALT: 13 IU/L (ref 0–44)
AST: 15 IU/L (ref 0–40)
Albumin: 4.6 g/dL (ref 3.5–5.5)
Alkaline Phosphatase: 70 IU/L (ref 39–117)
BILIRUBIN TOTAL: 0.4 mg/dL (ref 0.0–1.2)
Bilirubin, Direct: 0.11 mg/dL (ref 0.00–0.40)
Total Protein: 6.6 g/dL (ref 6.0–8.5)

## 2017-10-09 LAB — HEMOGLOBIN A1C
Est. average glucose Bld gHb Est-mCnc: 114 mg/dL
HEMOGLOBIN A1C: 5.6 % (ref 4.8–5.6)

## 2017-10-09 LAB — LIPID PANEL
CHOL/HDL RATIO: 4.4 ratio (ref 0.0–5.0)
CHOLESTEROL TOTAL: 254 mg/dL — AB (ref 100–199)
HDL: 58 mg/dL (ref >39)
LDL Calculated: 164 mg/dL — ABNORMAL HIGH (ref 0–99)
TRIGLYCERIDES: 159 mg/dL — AB (ref 0–149)
VLDL Cholesterol Cal: 32 mg/dL (ref 5–40)

## 2017-10-09 LAB — BASIC METABOLIC PANEL
BUN / CREAT RATIO: 8 — AB (ref 9–20)
BUN: 9 mg/dL (ref 6–24)
CALCIUM: 9.6 mg/dL (ref 8.7–10.2)
CHLORIDE: 104 mmol/L (ref 96–106)
CO2: 25 mmol/L (ref 20–29)
Creatinine, Ser: 1.16 mg/dL (ref 0.76–1.27)
GFR, EST AFRICAN AMERICAN: 80 mL/min/{1.73_m2} (ref >59)
GFR, EST NON AFRICAN AMERICAN: 69 mL/min/{1.73_m2} (ref >59)
Glucose: 102 mg/dL — ABNORMAL HIGH (ref 65–99)
Potassium: 5.1 mmol/L (ref 3.5–5.2)
Sodium: 141 mmol/L (ref 134–144)

## 2017-10-09 LAB — CBC
Hematocrit: 47.1 % (ref 37.5–51.0)
Hemoglobin: 15.2 g/dL (ref 13.0–17.7)
MCH: 29.6 pg (ref 26.6–33.0)
MCHC: 32.3 g/dL (ref 31.5–35.7)
MCV: 92 fL (ref 79–97)
Platelets: 240 10*3/uL (ref 150–450)
RBC: 5.14 x10E6/uL (ref 4.14–5.80)
RDW: 14.3 % (ref 12.3–15.4)
WBC: 6.5 10*3/uL (ref 3.4–10.8)

## 2017-10-09 LAB — PSA: Prostate Specific Ag, Serum: 1.3 ng/mL (ref 0.0–4.0)

## 2017-10-09 MED ORDER — ROSUVASTATIN CALCIUM 5 MG PO TABS: 5.0000 mg | ORAL_TABLET | Freq: Every day | ORAL | 3 refills | Status: DC

## 2017-10-17 ENCOUNTER — Encounter: Payer: Self-pay | Admitting: Physician Assistant

## 2017-10-18 ENCOUNTER — Other Ambulatory Visit: Payer: Self-pay | Admitting: *Deleted

## 2017-10-18 DIAGNOSIS — E291 Testicular hypofunction: Secondary | ICD-10-CM

## 2017-10-31 ENCOUNTER — Encounter: Payer: BLUE CROSS/BLUE SHIELD | Admitting: Family Medicine

## 2017-11-25 ENCOUNTER — Other Ambulatory Visit: Payer: Self-pay | Admitting: Family Medicine

## 2017-11-26 NOTE — Telephone Encounter (Signed)
Patient is requesting a refill of the following medications: Requested Prescriptions   Pending Prescriptions Disp Refills  . testosterone cypionate (DEPOTESTOSTERONE CYPIONATE) 200 MG/ML injection [Pharmacy Med Name: TESTOSTERONE CYP 200MG /ML INJ, 1ML] 3 mL 0    Sig: INJECT 1 ML INTO THE MUSCLE EVERY 14 DAYS    Date of patient request: 11/25/2017 Last office visit: 10/08/2017 Date of last refill: 10/08/2017 Last refill amount: 2ml Follow up time period per chart:

## 2017-11-26 NOTE — Telephone Encounter (Signed)
Please remind patient that he needs to come in to have his testosterone level checked prior to next refill due. thanks

## 2017-11-28 ENCOUNTER — Telehealth: Payer: Self-pay | Admitting: Family Medicine

## 2017-11-28 NOTE — Telephone Encounter (Signed)
Patient was denied his medication testosterone 200 mg/ml injection by their insurance company.

## 2017-11-29 NOTE — Telephone Encounter (Signed)
Please schedule patient to establish care as patient's previous pcp in longer with this practice. thanks 

## 2017-11-30 ENCOUNTER — Telehealth: Payer: Self-pay | Admitting: Family Medicine

## 2017-11-30 NOTE — Telephone Encounter (Signed)
Called patient to try and schedule an appt to establish with another provider since his previous pcp is no longer at our practice per Dr. Pamella Pert since his medication was denied by his insurance company. Did not give permission to leave a VM per dpr.

## 2018-04-11 ENCOUNTER — Other Ambulatory Visit: Payer: Self-pay | Admitting: Physician Assistant

## 2018-04-11 DIAGNOSIS — E291 Testicular hypofunction: Secondary | ICD-10-CM

## 2018-04-11 DIAGNOSIS — I10 Essential (primary) hypertension: Secondary | ICD-10-CM

## 2018-04-12 NOTE — Telephone Encounter (Signed)
Patient called and stated that his provider(Dr.Clark) is no longer there and he needs two scripts filled. Needs prior authorizations for both  metoprolol succinate (TOPROL-XL) 50 MG 24 hr tablet [[id number]]   testosterone cypionate (DEPOTESTOSTERONE CYPIONATE) 200 MG/ML injection [[id number]]

## 2018-04-15 ENCOUNTER — Other Ambulatory Visit: Payer: Self-pay | Admitting: *Deleted

## 2018-04-15 ENCOUNTER — Encounter: Payer: Self-pay | Admitting: Family Medicine

## 2018-04-15 DIAGNOSIS — I10 Essential (primary) hypertension: Secondary | ICD-10-CM

## 2018-04-15 MED ORDER — TESTOSTERONE CYPIONATE 200 MG/ML IM SOLN: INTRAMUSCULAR | 0 refills | Status: DC

## 2018-04-15 MED ORDER — METOPROLOL SUCCINATE ER 50 MG PO TB24: ORAL_TABLET | ORAL | 0 refills | Status: DC

## 2018-04-17 NOTE — Telephone Encounter (Signed)
Rx has been sent to pharmacy

## 2018-05-01 ENCOUNTER — Other Ambulatory Visit: Payer: Self-pay | Admitting: Family Medicine

## 2018-05-04 NOTE — Telephone Encounter (Signed)
Last OV aug 2019 with Carlis Abbott, PA-C Last PSA normal, stable, aug 2019 Med refilled, 57ml no refills Needs to establish with new PCP at practice of choice.

## 2018-05-12 ENCOUNTER — Other Ambulatory Visit: Payer: Self-pay | Admitting: Physician Assistant

## 2018-05-12 DIAGNOSIS — F419 Anxiety disorder, unspecified: Secondary | ICD-10-CM

## 2018-05-13 NOTE — Telephone Encounter (Signed)
Patient is requesting a refill of the following medications: Requested Prescriptions   Pending Prescriptions Disp Refills  . traZODone (DESYREL) 50 MG tablet [Pharmacy Med Name: TRAZODONE 50MG  TABLETS] 180 tablet 1    Sig: TAKE 1 TO 2 TABLETS(50 TO 100 MG) BY MOUTH AT BEDTIME    Date of patient request: 05/12/2018 Last office visit: 10/08/2017 Date of last refill:10/08/2017 Last refill amount: 180 RF 1 Follow up time period per chart:

## 2018-06-11 ENCOUNTER — Other Ambulatory Visit: Payer: Self-pay | Admitting: Family Medicine

## 2018-06-11 ENCOUNTER — Telehealth: Payer: BLUE CROSS/BLUE SHIELD | Admitting: Family Medicine

## 2018-06-11 ENCOUNTER — Other Ambulatory Visit: Payer: Self-pay

## 2018-06-11 NOTE — Telephone Encounter (Signed)
Patient will be seing Dr Carlota Raspberry today will talk about refill at appt time

## 2018-06-13 NOTE — Telephone Encounter (Signed)
No-show for appointment 2 days ago by telemedicine.  Please call to reschedule that appointment.  I did refill medication temporarily but needs office visit by tele-med.  Thanks.

## 2018-06-13 NOTE — Telephone Encounter (Signed)
Please advise 

## 2018-06-14 ENCOUNTER — Other Ambulatory Visit: Payer: Self-pay | Admitting: Family Medicine

## 2018-06-14 DIAGNOSIS — F419 Anxiety disorder, unspecified: Secondary | ICD-10-CM
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
## 2018-06-14 NOTE — Telephone Encounter (Signed)
Please assist to schedule visit for med follow up tele-med visit with Dr. Carlota Raspberry at the next available

## 2018-07-22 ENCOUNTER — Other Ambulatory Visit: Payer: Self-pay | Admitting: Family Medicine

## 2018-07-22 DIAGNOSIS — I10 Essential (primary) hypertension: Secondary | ICD-10-CM

## 2018-07-23 ENCOUNTER — Other Ambulatory Visit: Payer: Self-pay | Admitting: Family Medicine

## 2018-07-23 DIAGNOSIS — F419 Anxiety disorder, unspecified: Secondary | ICD-10-CM

## 2018-08-06 NOTE — Telephone Encounter (Signed)
Called pt to schedule appt. Did not give permission to leave VM per dpr.

## 2018-08-06 NOTE — Telephone Encounter (Signed)
NO SHOWED W/ DR Carlota Raspberry

## 2018-08-20 DIAGNOSIS — Z6828 Body mass index (BMI) 28.0-28.9, adult: Secondary | ICD-10-CM | POA: Diagnosis not present

## 2018-08-20 DIAGNOSIS — M545 Low back pain: Secondary | ICD-10-CM | POA: Diagnosis not present

## 2018-08-20 DIAGNOSIS — R030 Elevated blood-pressure reading, without diagnosis of hypertension: Secondary | ICD-10-CM | POA: Diagnosis not present

## 2018-08-20 DIAGNOSIS — M5136 Other intervertebral disc degeneration, lumbar region: Secondary | ICD-10-CM | POA: Diagnosis not present

## 2018-08-21 ENCOUNTER — Ambulatory Visit: Payer: BLUE CROSS/BLUE SHIELD | Admitting: Family Medicine

## 2018-08-21 ENCOUNTER — Other Ambulatory Visit: Payer: Self-pay | Admitting: Family Medicine

## 2018-08-21 DIAGNOSIS — I10 Essential (primary) hypertension: Secondary | ICD-10-CM

## 2018-08-29 ENCOUNTER — Telehealth: Payer: Self-pay | Admitting: Family Medicine

## 2018-08-29 ENCOUNTER — Ambulatory Visit: Payer: BLUE CROSS/BLUE SHIELD | Admitting: Family Medicine
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
## 2018-09-02 ENCOUNTER — Encounter: Payer: Self-pay | Admitting: Physician Assistant

## 2018-09-05 ENCOUNTER — Other Ambulatory Visit: Payer: Self-pay | Admitting: Physician Assistant

## 2018-09-05 ENCOUNTER — Other Ambulatory Visit: Payer: Self-pay | Admitting: Family Medicine

## 2018-09-05 NOTE — Telephone Encounter (Signed)
Due to closure and need to reschedule, will refill once. Needs appt for future refills.

## 2018-09-05 NOTE — Telephone Encounter (Signed)
Forwarding medication refill to PCP for review. 

## 2018-09-06 NOTE — Telephone Encounter (Signed)
Refilled medication for 30 days until pt can get an appointment.

## 2018-09-18 ENCOUNTER — Encounter: Payer: Self-pay | Admitting: Gastroenterology

## 2018-09-23 ENCOUNTER — Other Ambulatory Visit: Payer: Self-pay | Admitting: Family Medicine

## 2018-09-23 DIAGNOSIS — I10 Essential (primary) hypertension: Secondary | ICD-10-CM
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
## 2018-10-23 ENCOUNTER — Other Ambulatory Visit: Payer: Self-pay | Admitting: Family Medicine

## 2018-10-23 DIAGNOSIS — I10 Essential (primary) hypertension: Secondary | ICD-10-CM
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
## 2018-11-06 NOTE — Telephone Encounter (Signed)
Scheduled for 11/11/2018

## 2018-11-11 ENCOUNTER — Other Ambulatory Visit: Payer: Self-pay

## 2018-11-11 ENCOUNTER — Telehealth: Payer: BLUE CROSS/BLUE SHIELD | Admitting: Family Medicine

## 2018-11-11 DIAGNOSIS — E785 Hyperlipidemia, unspecified: Secondary | ICD-10-CM

## 2018-11-11 DIAGNOSIS — Z125 Encounter for screening for malignant neoplasm of prostate: Secondary | ICD-10-CM

## 2018-11-11 DIAGNOSIS — F419 Anxiety disorder, unspecified: Secondary | ICD-10-CM

## 2018-11-11 DIAGNOSIS — Z1211 Encounter for screening for malignant neoplasm of colon: Secondary | ICD-10-CM

## 2018-11-11 DIAGNOSIS — I10 Essential (primary) hypertension: Secondary | ICD-10-CM

## 2018-11-11 DIAGNOSIS — Z5181 Encounter for therapeutic drug level monitoring: Secondary | ICD-10-CM

## 2018-11-11 DIAGNOSIS — E291 Testicular hypofunction: Secondary | ICD-10-CM | POA: Diagnosis not present

## 2018-11-11 MED ORDER — "VANISHPOINT SAFETY SYRINGE 22G X 1-1/2"" 3 ML MISC": 1 refills | Status: DC

## 2018-11-11 MED ORDER — TRAZODONE HCL 50 MG PO TABS: 50.0000 mg | ORAL_TABLET | Freq: Every evening | ORAL | 1 refills | Status: DC | PRN

## 2018-11-11 MED ORDER — TESTOSTERONE CYPIONATE 200 MG/ML IM SOLN: INTRAMUSCULAR | 1 refills | Status: DC

## 2018-11-11 MED ORDER — PAROXETINE HCL 40 MG PO TABS: 40.0000 mg | ORAL_TABLET | Freq: Every day | ORAL | 3 refills | Status: DC

## 2018-11-11 MED ORDER — METOPROLOL SUCCINATE ER 100 MG PO TB24: ORAL_TABLET | ORAL | 1 refills | Status: DC

## 2018-11-11 MED ORDER — ROSUVASTATIN CALCIUM 5 MG PO TABS: 5.0000 mg | ORAL_TABLET | Freq: Every day | ORAL | 1 refills | Status: DC

## 2018-11-11 MED ORDER — HYDROXYZINE HCL 25 MG PO TABS: 25.0000 mg | ORAL_TABLET | Freq: Three times a day (TID) | ORAL | 5 refills | Status: DC | PRN

## 2018-11-11 NOTE — Progress Notes (Signed)
CC- f/u for medication refill- #1 Patient need a refill on  All pended medication.  #2 patient have been having a headache for the past 2 week. Think this may be due to vision or stress. Patient stated he need to have labs for Testerone level and other labs. But he was flagged by the system for headache on the covid screening.

## 2018-11-11 NOTE — Progress Notes (Signed)
Virtual Visit via Telephone Note  I connected with Curtis Yang on 11/11/18 at 9:20 AM by telephone and verified that I am speaking with the correct person using two identifiers.   I discussed the limitations, risks, security and privacy concerns of performing an evaluation and management service by telephone and the availability of in person appointments. I also discussed with the patient that there may be a patient responsible charge related to this service. The patient expressed understanding and agreed to proceed, consent obtained  Chief complaint: Med refills, labs, headache.    History of Present Illness: Curtis Yang is a 58 y.o. male  Headache: Has not had vision checked in few years. HA off and on for past few weeks. Thinks d/t vision. More HA after reading. Reading glasses need to be changed.  No fever, cough, dyspnea, sore throat, myalgias.  No sick contacts, included Covid exposure.  Plans to see optho. No weakness, slurred speech.  Tx: alleve QD. Helps HA.   Anxiety: Some stress with work, some increased work stress - handling ok. Not usually causing HA. Anxiety has increased some - self managing with breathing, relaxation techniques. paxil working well, trazodone works well. Feels rested.  Hydroxyzine - every other day - 1-2 times. Working well.  Prior on benzodiazepines.   Hyperlipidemia: crestor 5mg  QD. No new myalgias/side effects.  Lab Results  Component Value Date   CHOL 254 (H) 10/08/2017   HDL 58 10/08/2017   LDLCALC 164 (H) 10/08/2017   TRIG 159 (H) 10/08/2017   CHOLHDL 4.4 10/08/2017   Lab Results  Component Value Date   ALT 13 10/08/2017   AST 15 10/08/2017   ALKPHOS 70 10/08/2017   BILITOT 0.4 10/08/2017   Hypertension: BP Readings from Last 3 Encounters:  10/08/17 135/84  08/13/17 125/77  04/30/17 122/70   Lab Results  Component Value Date   CREATININE 1.16 10/08/2017  toprol 50mg  - 2 at night  Outside readings: 125/80 No  new side effects.   Constitutional: Negative for fatigue and unexpected weight change.  Eyes: Negative for visual disturbance.  Respiratory: Negative for cough, chest tightness and shortness of breath.   Cardiovascular: Negative for chest pain, palpitations and leg swelling.  Gastrointestinal: Negative for abdominal pain and blood in stool.  Neurological: Negative for dizziness, light-headedness and headaches.    Hypogonadism: Using testosterone 200mg  injection every 3 weeks - seemed to level off at that frequency. Last injection 8/30.  Last testosterone March 2019 - 924 - continued same dose, possible at 2 week intervals then.  PSA 1.3 in 09/2017. LFT's, HGB normal at that time.  Controlled substance database (PDMP) reviewed. No concerns appreciated.     Patient Active Problem List   Diagnosis Date Noted  . Essential hypertension 03/27/2016  . Lumbar stenosis 09/16/2015  . Gastroesophageal reflux disease without esophagitis 02/25/2014  . 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 Depression 03/15/2011  . Insomnia 03/15/2011  . Anxiety 03/15/2011  . Increased serum lipids 03/15/2011  . Low back pain 03/15/2011  . Erectile dysfunction 03/15/2011  . Increased glucose level 03/15/2011  . Testosterone deficiency 03/15/2011  . History of colon polyps 03/15/2011   Past Medical History:  Diagnosis Date  . Anxiety   . Arthritis    DDD lumbar.  . Benign skin lesion of nose   . Depression   . Erectile dysfunction    Due to decreased testosterone   . GERD (gastroesophageal reflux disease)    NO TROUBLE IN  YEARS  . History of colon polyps   . Hyperlipidemia   . Hypogonadism male   . Increased glucose level   . Insomnia   . Low back pain    Past Surgical History:  Procedure Laterality Date  . BREAST MASS EXCISION     RIGHT CHEST  BENIGN  . COLONOSCOPY W/ POLYPECTOMY  09/2013   One polyp, came back pre-cancerous. Repeat 5 years unless symptoms present.   [name]  02/28/2000   Lasik  . MASS EXCISION     RIGHT EAR   BENIGN  . SPINE SURGERY  09/26/2012   [facility] [place].  . TRANSFORAMINAL LUMBAR INTERBODY FUSION (TLIF) WITH PEDICLE SCREW FIXATION 1 LEVEL Right 04/22/2013   Procedure: TRANSFORAMINAL LUMBAR INTERBODY FUSION (TLIF) WITH PEDICLE SCREW FIXATION LUMBAR FIVE SACRAL ONE;  Surgeon: Faythe Ghee, MD;  Location: [facility];  Service: Neurosurgery;  Laterality: Right;   No Known Allergies Prior to Admission medications   Medication Sig Start Date End Date Taking? Authorizing Provider  hydrOXYzine (ATARAX/VISTARIL) 25 MG tablet Take 1 tablet (25 mg total) by mouth every 8 (eight) hours as needed for anxiety. 10/08/17  Yes Tereasa Coop, PA-C  metoprolol succinate (TOPROL-XL) 50 MG 24 hr tablet TAKE 1 TABLET BY MOUTH TWICE DAILY WITH OR IMMEDIATELY FOLLOWING A MEAL 09/23/18  Yes Wendie Agreste, MD  PARoxetine (PAXIL) 40 MG tablet Take 1 tablet (40 mg total) by mouth daily. 10/08/17  Yes Tereasa Coop, PA-C  rosuvastatin (CRESTOR) 5 MG tablet TAKE 1 TABLET BY MOUTH EVERY DAY 09/06/18  Yes Stallings, Zoe A, MD  SYRINGE-NEEDLE, DISP, 3 ML (VANISHPOINT SAFETY SYRINGE) 22G X 1-1/2" 3 ML MISC USE AS DIRECTED TO SELF-ADMINISTER TESTOSTERONE 04/30/17  Yes Wardell Honour, MD  testosterone cypionate (DEPOTESTOSTERONE CYPIONATE) 200 MG/ML injection ADMINISTER 1 ML IN THE MUSCLE EVERY 14 DAYS 09/05/18  Yes Wendie Agreste, MD  traZODone (DESYREL) 50 MG tablet TAKE 1 TO 2 TABLETS(50 TO 100 MG) BY MOUTH AT BEDTIME 06/14/18  Yes Rutherford Guys, MD   Social History   Socioeconomic History  . Marital status: Married    Spouse name: Not on file  . Number of children: Not on file  . Years of education: Not on file  . Highest education level: Not on file  Occupational History  . Not on file  Social Needs  . Financial resource strain: Not on file  . Food insecurity    Worry: Not on file    Inability: Not on file  .  Transportation needs    Medical: Not on file    Non-medical: Not on file  Tobacco Use  . Smoking status: Never Smoker  . Smokeless tobacco: Never Used  Substance and Sexual Activity  . Alcohol use: No  . Drug use: No  . Sexual activity: Yes    Comment: number of sex partners in the last 12 months  1  Lifestyle  . Physical activity    Days per week: Not on file    Minutes per session: Not on file  . Stress: Not on file  Relationships  . Social Herbalist on phone: Not on file    Gets together: Not on file    Attends religious service: Not on file    Active member of club or organization: Not on file    Attends meetings of clubs or organizations: Not on file    Relationship status: Not  on file  . Intimate partner violence    Fear of current or ex partner: Not on file    Emotionally abused: Not on file    Physically abused: Not on file    Forced sexual activity: Not on file  Other Topics Concern  . Not on file  Social History Narrative   Marital status:  Married x 54 years; happily married; second marriage; first wife died AMI age 54.        Children:  1 child; 3 stepchildren.  Several grandchildren.      Lives: with wife.      Employment:  [employer]; Print production planner x 19 years; happy.  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        Tobacco: never       Alcohol:  None      Drugs: none      Exercise:  Walking twice daily 2-3 miles total each day.  Job physically demanding.        Seatbelt: 100%; no texting      Guns: yes; unloaded; in safe.      Sunscreen: SPF on face.     Observations/Objective: No distress, appropriate responses. . There were no vitals filed for this visit.   Assessment and Plan: Hypogonadism in male - Plan: SYRINGE-NEEDLE, DISP, 3 ML (VANISHPOINT SAFETY SYRINGE) 22G X 1-1/2" 3 ML MISC, testosterone cypionate (DEPOTESTOSTERONE CYPIONATE) 200 MG/ML injection, Testosterone, Free, Total, SHBG, CANCELED: Testosterone, Free, Total, SHBG  Medication monitoring encounter - Plan: CBC, Comprehensive metabolic panel, Lipid panel, PSA, CANCELED: Comprehensive metabolic panel, CANCELED: Lipid panel, CANCELED: CBC, CANCELED: PSA   - check labs, monitoring labs above. Continue same dose for now.  May need mid dosing testing to determine true readings.  Discuss further at 6-week follow-up depending on levels.  Anxiety - Well controlled on paroxetine 40 daily qam. Occasional atarax.  - Plan: hydrOXYzine (ATARAX/VISTARIL) 25 MG tablet, PARoxetine (PAXIL) 40 MG tablet, traZODone (DESYREL) 50 MG tablet  -Continue Paxil, trazodone, stress management discussed.  Essential hypertension - See HPI.  Ambulatory measures well controlled.  - Plan: metoprolol succinate (TOPROL-XL) 100 MG 24 hr tablet, Comprehensive metabolic panel, CANCELED: Comprehensive metabolic panel  -Controlled per home readings.  Continue same regimen, lab only visit pending.  Hyperlipidemia, unspecified hyperlipidemia type - Plan: rosuvastatin (CRESTOR) 5 MG tablet, Comprehensive metabolic panel, CANCELED: Comprehensive metabolic panel  -Continue Crestor, check labs.  Screening for prostate cancer - Plan: PSA, CANCELED: PSA With use of testosterone, check PSa  Screen for colon cancer - Plan: Ambulatory referral to Gastroenterology  Headache discussed, no concerning features on history.  Recommended follow-up with eye care provider as may be related to vision.  RTC precautions.   Follow Up Instructions: Lab visit this week.   next 6 weeks to review labs and testosterone plan.    Patient Instructions  Call eye care provider to schedule appointment.  See other info below for headache.  Return to the clinic or go to the nearest emergency room if any of your symptoms worsen or new symptoms occur.  No med changes for now.  Lab only visit tomorrow, then follow-up with me in the next 6 weeks to discuss testosterone and other medications further if needed at that time.  Let  me know if there are questions.     General Headache Without Cause A headache is pain or discomfort that is felt around the head or neck area. There are many causes and types of headaches. In  some cases, the cause may not be found. Follow these instructions at home: Watch your condition for any changes. Let your doctor know about them. Take these steps to help with your condition: Managing pain      Take over-the-counter and prescription medicines only as told by your doctor.  Lie down in a dark, quiet room when you have a headache.  If told, put ice on your head and neck area: ? Put ice in a plastic bag. ? Place a towel between your skin and the bag. ? Leave the ice on for 20 minutes, 2-3 times per day.  If told, put heat on the affected area. Use the heat source that your doctor recommends, such as a moist heat pack or a heating pad. ? Place a towel between your skin and the heat source. ? Leave the heat on for 20-30 minutes. ? Remove the heat if your skin turns bright red. This is very important if you are unable to feel pain, heat, or cold. You may have a greater risk of getting burned.  Keep lights dim if bright lights bother you or make your headaches worse. Eating and drinking  Eat meals on a regular schedule.  If you drink alcohol: ? Limit how much you use to:  0-1 drink a day for women.  0-2 drinks a day for men. ? Be aware of how much alcohol is in your drink. In the [place] one drink equals one 12 oz bottle of beer (355 mL), one 5 oz glass of wine (148 mL), or one 1 oz glass of hard liquor (44 mL).  Stop drinking caffeine, or reduce how much caffeine you drink. General instructions   Keep a journal to find out if certain things bring on headaches. For example, write down: ? What you eat and drink. ? How much sleep you get. ? Any change to your diet or medicines.  Get a massage or try other ways to relax.  Limit stress.  Sit up straight. Do not tighten  (tense) your muscles.  Do not use any products that contain nicotine or tobacco. This includes cigarettes, e-cigarettes, and chewing tobacco. If you need help quitting, ask your doctor.  Exercise regularly as told by your doctor.  Get enough sleep. This often means 7-9 hours of sleep each night.  Keep all follow-up visits as told by your doctor. This is important. Contact a doctor if:  Your symptoms are not helped by medicine.  You have a headache that feels different than the other headaches.  You feel sick to your stomach (nauseous) or you throw up (vomit).  You have a fever. Get help right away if:  Your headache gets very bad quickly.  Your headache gets worse after a lot of physical activity.  You keep throwing up.  You have a stiff neck.  You have trouble seeing.  You have trouble speaking.  You have pain in the eye or ear.  Your muscles are weak or you lose muscle control.  You lose your balance or have trouble walking.  You feel like you will pass out (faint) or you pass out.  You are mixed up (confused).  You have a seizure. Summary  A headache is pain or discomfort that is felt around the head or neck area.  There are many causes and types of headaches. In some cases, the cause may not be found.  Keep a journal to help find out what causes your headaches. Watch your condition for  any changes. Let your doctor know about them.  Contact a doctor if you have a headache that is different from usual, or if your headache is not helped by medicine.  Get help right away if your headache gets very bad, you throw up, you have trouble seeing, you lose your balance, or you have a seizure. This information is not intended to replace advice given to you by your health care provider. Make sure you discuss any questions you have with your health care provider. Document Released: 11/23/2007 Document Revised: 09/03/2017 Document Reviewed: 09/03/2017 Elsevier Patient  Education  [place].    I discussed the assessment and treatment plan with the patient. The patient was provided an opportunity to ask questions and all were answered. The patient agreed with the plan and demonstrated an understanding of the instructions.   The patient was advised to call back or seek an in-person evaluation if the symptoms worsen or if the condition fails to improve as anticipated.  I provided 19 minutes of non-face-to-face time during this encounter.  Signed,   Merri Ray, MD [facility].  11/11/18

## 2018-11-11 NOTE — Patient Instructions (Addendum)
Call eye care provider to schedule appointment.  See other info below for headache.  Return to the clinic or go to the nearest emergency room if any of your symptoms worsen or new symptoms occur.  No med changes for now.  Lab only visit tomorrow, then follow-up with me in the next 6 weeks to discuss testosterone and other medications further if needed at that time.  Let me know if there are questions.     General Headache Without Cause A headache is pain or discomfort that is felt around the head or neck area. There are many causes and types of headaches. In some cases, the cause may not be found. Follow these instructions at home: Watch your condition for any changes. Let your doctor know about them. Take these steps to help with your condition: Managing pain      Take over-the-counter and prescription medicines only as told by your doctor.  Lie down in a dark, quiet room when you have a headache.  If told, put ice on your head and neck area: ? Put ice in a plastic bag. ? Place a towel between your skin and the bag. ? Leave the ice on for 20 minutes, 2-3 times per day.  If told, put heat on the affected area. Use the heat source that your doctor recommends, such as a moist heat pack or a heating pad. ? Place a towel between your skin and the heat source. ? Leave the heat on for 20-30 minutes. ? Remove the heat if your skin turns bright red. This is very important if you are unable to feel pain, heat, or cold. You may have a greater risk of getting burned.  Keep lights dim if bright lights bother you or make your headaches worse. Eating and drinking  Eat meals on a regular schedule.  If you drink alcohol: ? Limit how much you use to:  0-1 drink a day for women.  0-2 drinks a day for men. ? Be aware of how much alcohol is in your drink. In the [place] one drink equals one 12 oz bottle of beer (355 mL), one 5 oz glass of wine (148 mL), or one 1 oz glass of hard liquor (44 mL).   Stop drinking caffeine, or reduce how much caffeine you drink. General instructions   Keep a journal to find out if certain things bring on headaches. For example, write down: ? What you eat and drink. ? How much sleep you get. ? Any change to your diet or medicines.  Get a massage or try other ways to relax.  Limit stress.  Sit up straight. Do not tighten (tense) your muscles.  Do not use any products that contain nicotine or tobacco. This includes cigarettes, e-cigarettes, and chewing tobacco. If you need help quitting, ask your doctor.  Exercise regularly as told by your doctor.  Get enough sleep. This often means 7-9 hours of sleep each night.  Keep all follow-up visits as told by your doctor. This is important. Contact a doctor if:  Your symptoms are not helped by medicine.  You have a headache that feels different than the other headaches.  You feel sick to your stomach (nauseous) or you throw up (vomit).  You have a fever. Get help right away if:  Your headache gets very bad quickly.  Your headache gets worse after a lot of physical activity.  You keep throwing up.  You have a stiff neck.  You have trouble seeing.  You have trouble speaking.  You have pain in the eye or ear.  Your muscles are weak or you lose muscle control.  You lose your balance or have trouble walking.  You feel like you will pass out (faint) or you pass out.  You are mixed up (confused).  You have a seizure. Summary  A headache is pain or discomfort that is felt around the head or neck area.  There are many causes and types of headaches. In some cases, the cause may not be found.  Keep a journal to help find out what causes your headaches. Watch your condition for any changes. Let your doctor know about them.  Contact a doctor if you have a headache that is different from usual, or if your headache is not helped by medicine.  Get help right away if your headache gets  very bad, you throw up, you have trouble seeing, you lose your balance, or you have a seizure. This information is not intended to replace advice given to you by your health care provider. Make sure you discuss any questions you have with your health care provider. Document Released: 11/23/2007 Document Revised: 09/03/2017 Document Reviewed: 09/03/2017 Elsevier Patient Education  2020 [employer].

## 2018-11-18 ENCOUNTER — Other Ambulatory Visit: Payer: Self-pay

## 2018-11-18 ENCOUNTER — Ambulatory Visit (INDEPENDENT_AMBULATORY_CARE_PROVIDER_SITE_OTHER): Payer: BLUE CROSS/BLUE SHIELD | Admitting: Family Medicine

## 2018-11-18 DIAGNOSIS — E785 Hyperlipidemia, unspecified: Secondary | ICD-10-CM | POA: Diagnosis not present

## 2018-11-18 DIAGNOSIS — Z5181 Encounter for therapeutic drug level monitoring: Secondary | ICD-10-CM | POA: Diagnosis not present

## 2018-11-18 DIAGNOSIS — Z125 Encounter for screening for malignant neoplasm of prostate: Secondary | ICD-10-CM

## 2018-11-18 DIAGNOSIS — E291 Testicular hypofunction: Secondary | ICD-10-CM | POA: Diagnosis not present

## 2018-11-18 DIAGNOSIS — I10 Essential (primary) hypertension: Secondary | ICD-10-CM | POA: Diagnosis not present

## 2018-11-18 DIAGNOSIS — Z23 Encounter for immunization: Secondary | ICD-10-CM | POA: Diagnosis not present

## 2018-11-18 NOTE — Addendum Note (Signed)
Addended by: Dierdre Searles on: 11/18/2018 08:30 AM   Modules accepted: Orders

## 2018-11-18 NOTE — Patient Instructions (Signed)
Injection given, tolerated well.

## 2018-11-21 LAB — CBC
Hematocrit: 43.7 % (ref 37.5–51.0)
Hemoglobin: 14.4 g/dL (ref 13.0–17.7)
MCH: 31.4 pg (ref 26.6–33.0)
MCHC: 33 g/dL (ref 31.5–35.7)
MCV: 95 fL (ref 79–97)
Platelets: 197 10*3/uL (ref 150–450)
RBC: 4.59 x10E6/uL (ref 4.14–5.80)
RDW: 13.4 % (ref 11.6–15.4)
WBC: 8.4 10*3/uL (ref 3.4–10.8)

## 2018-11-21 LAB — COMPREHENSIVE METABOLIC PANEL
ALT: 15 IU/L (ref 0–44)
AST: 19 IU/L (ref 0–40)
Albumin/Globulin Ratio: 1.9 (ref 1.2–2.2)
Albumin: 4.4 g/dL (ref 3.8–4.9)
Alkaline Phosphatase: 85 IU/L (ref 39–117)
BUN/Creatinine Ratio: 10 (ref 9–20)
BUN: 11 mg/dL (ref 6–24)
Bilirubin Total: 0.5 mg/dL (ref 0.0–1.2)
CO2: 27 mmol/L (ref 20–29)
Calcium: 9.5 mg/dL (ref 8.7–10.2)
Chloride: 100 mmol/L (ref 96–106)
Creatinine, Ser: 1.07 mg/dL (ref 0.76–1.27)
GFR calc Af Amer: 87 mL/min/{1.73_m2} (ref >59)
GFR calc non Af Amer: 76 mL/min/{1.73_m2} (ref >59)
Globulin, Total: 2.3 g/dL (ref 1.5–4.5)
Glucose: 160 mg/dL — ABNORMAL HIGH (ref 65–99)
Potassium: 4.3 mmol/L (ref 3.5–5.2)
Sodium: 140 mmol/L (ref 134–144)
Total Protein: 6.7 g/dL (ref 6.0–8.5)

## 2018-11-21 LAB — LIPID PANEL
Chol/HDL Ratio: 3.4 ratio (ref 0.0–5.0)
Cholesterol, Total: 163 mg/dL (ref 100–199)
HDL: 48 mg/dL (ref >39)
LDL Chol Calc (NIH): 84 mg/dL (ref 0–99)
Triglycerides: 182 mg/dL — ABNORMAL HIGH (ref 0–149)
VLDL Cholesterol Cal: 31 mg/dL (ref 5–40)

## 2018-11-21 LAB — TESTOSTERONE, FREE, TOTAL, SHBG
Sex Hormone Binding: 47 nmol/L (ref 19.3–76.4)
Testosterone, Free: 3.9 pg/mL — ABNORMAL LOW (ref 7.2–24.0)
Testosterone: 318 ng/dL (ref 264–916)

## 2018-11-21 LAB — PSA: Prostate Specific Ag, Serum: 1.2 ng/mL (ref 0.0–4.0)

## 2018-12-01 ENCOUNTER — Other Ambulatory Visit: Payer: Self-pay | Admitting: Physician Assistant

## 2018-12-01 DIAGNOSIS — F419 Anxiety disorder, unspecified: Secondary | ICD-10-CM

## 2018-12-09 ENCOUNTER — Encounter: Payer: BLUE CROSS/BLUE SHIELD | Admitting: Family Medicine

## 2018-12-10 ENCOUNTER — Other Ambulatory Visit: Payer: Self-pay

## 2018-12-10 ENCOUNTER — Ambulatory Visit (AMBULATORY_SURGERY_CENTER): Payer: Self-pay

## 2018-12-10 VITALS — Ht 72.0 in | Wt 204.0 lb

## 2018-12-10 DIAGNOSIS — Z86010 Personal history of colonic polyps: Secondary | ICD-10-CM

## 2018-12-10 MED ORDER — NA SULFATE-K SULFATE-MG SULF 17.5-3.13-1.6 GM/177ML PO SOLN: 1.0000 | Freq: Once | ORAL | 0 refills | Status: AC

## 2018-12-10 NOTE — Progress Notes (Signed)
Denies allergies to eggs or soy products. Denies complication of anesthesia or sedation. Denies use of weight loss medication. Denies use of O2.   [facility]i instructions given for colonoscopy.  Patient is scheduled for a colonoscopy on 12/26/18. Patient declined Covid screening.   A 15.00 coupon for Suprep was given to the patient to help with his co-pay.

## 2018-12-13 ENCOUNTER — Encounter: Payer: Self-pay | Admitting: Gastroenterology

## 2018-12-25 ENCOUNTER — Telehealth: Payer: Self-pay

## 2018-12-25 NOTE — Telephone Encounter (Signed)
Pt called back and answered no to all Covid-19 questions

## 2018-12-25 NOTE — Telephone Encounter (Signed)
Covid-19 screening questions   Do you now or have you had a fever in the last 14 days?  Do you have any respiratory symptoms of shortness of breath or cough now or in the last 14 days?  Do you have any family members or close contacts with diagnosed or suspected Covid-19 in the past 14 days?  Have you been tested for Covid-19 and found to be positive?       

## 2018-12-26 ENCOUNTER — Other Ambulatory Visit: Payer: Self-pay

## 2018-12-26 ENCOUNTER — Encounter: Payer: Self-pay | Admitting: Gastroenterology

## 2018-12-26 ENCOUNTER — Other Ambulatory Visit: Payer: Self-pay | Admitting: Gastroenterology

## 2018-12-26 ENCOUNTER — Ambulatory Visit (AMBULATORY_SURGERY_CENTER): Payer: BLUE CROSS/BLUE SHIELD | Admitting: Gastroenterology

## 2018-12-26 VITALS — BP 105/65 | HR 52 | Temp 98.0°F | Resp 13 | Ht 72.0 in | Wt 204.0 lb

## 2018-12-26 DIAGNOSIS — Z1211 Encounter for screening for malignant neoplasm of colon: Secondary | ICD-10-CM | POA: Diagnosis not present

## 2018-12-26 DIAGNOSIS — Z86010 Personal history of colonic polyps: Secondary | ICD-10-CM | POA: Diagnosis not present

## 2018-12-26 DIAGNOSIS — D122 Benign neoplasm of ascending colon: Secondary | ICD-10-CM | POA: Diagnosis not present

## 2018-12-26 MED ORDER — SODIUM CHLORIDE 0.9 % IV SOLN: 500.0000 mL | Freq: Once | INTRAVENOUS | Status: DC

## 2018-12-26 NOTE — Op Note (Signed)
[place] Patient Name: Curtis Yang Procedure Date: 12/26/2018 8:30 AM MRN: [id number] Endoscopist: Ladene Artist , MD Age: 58 Referring MD:  Date of Birth: 08-25-1960 Gender: Male Account #: [id number] Procedure:                Colonoscopy Indications:              Surveillance: Personal history of adenomatous                            polyps on last colonoscopy 5 years ago Medicines:                Monitored Anesthesia Care Procedure:                Pre-Anesthesia Assessment:                           - Prior to the procedure, a History and Physical                            was performed, and patient medications and                            allergies were reviewed. The patient's tolerance of                            previous anesthesia was also reviewed. The risks                            and benefits of the procedure and the sedation                            options and risks were discussed with the patient.                            All questions were answered, and informed consent                            was obtained. Prior Anticoagulants: The patient has                            taken no previous anticoagulant or antiplatelet                            agents. ASA Grade Assessment: II - A patient with                            mild systemic disease. After reviewing the risks                            and benefits, the patient was deemed in                            satisfactory condition to undergo the procedure.  After obtaining informed consent, the colonoscope                            was passed under direct vision. Throughout the                            procedure, the patient's blood pressure, pulse, and                            oxygen saturations were monitored continuously. The                            Colonoscope was introduced through the anus and                            advanced to the the  cecum, identified by                            appendiceal orifice and ileocecal valve. The                            ileocecal valve, appendiceal orifice, and rectum                            were photographed. The quality of the bowel                            preparation was good. The colonoscopy was performed                            without difficulty. The patient tolerated the                            procedure well. Scope In: 8:36:45 AM Scope Out: 8:48:59 AM Scope Withdrawal Time: 0 hours 10 minutes 29 seconds  Total Procedure Duration: 0 hours 12 minutes 14 seconds  Findings:                 The perianal and digital rectal examinations were                            normal.                           A 7 mm polyp was found in the ascending colon. The                            polyp was sessile. The polyp was removed with a                            cold snare. Resection and retrieval were complete.                           Anal papilla(e) were hypertrophied.  Internal hemorrhoids were found during                            retroflexion. The hemorrhoids were moderate and                            Grade I (internal hemorrhoids that do not prolapse).                           The exam was otherwise without abnormality on                            direct and retroflexion views. Complications:            No immediate complications. Estimated blood loss:                            None. Estimated Blood Loss:     Estimated blood loss: none. Impression:               - One 7 mm polyp in the ascending colon, removed                            with a cold snare. Resected and retrieved.                           - Anal papilla(e) were hypertrophied.                           - Internal hemorrhoids.                           - The examination was otherwise normal on direct                            and retroflexion views. Recommendation:           -  Repeat colonoscopy in 7 years for surveillance.                           - Patient has a contact number available for                            emergencies. The signs and symptoms of potential                            delayed complications were discussed with the                            patient. Return to normal activities tomorrow.                            Written discharge instructions were provided to the                            patient.                           -  Resume previous diet.                           - Continue present medications.                           - Await pathology results. Ladene Artist, MD 12/26/2018 8:52:38 AM This report has been signed electronically.

## 2018-12-26 NOTE — Patient Instructions (Signed)
Thank you for allowing [place] to care for you today!  Await pathology results by mail, approximately 2 weeks.  Recommend next colonoscopy in 7 years.  Resume previous diet and medications today.  Return to your normal activities tomorrow.    YOU HAD AN ENDOSCOPIC PROCEDURE TODAY AT [facility]:   Refer to the procedure report that was given to you for any specific questions about what was found during the examination.  If the procedure report does not answer your questions, please call your gastroenterologist to clarify.  If you requested that your care partner not be given the details of your procedure findings, then the procedure report has been included in a sealed envelope for you to review at your convenience later.  YOU SHOULD EXPECT: Some feelings of bloating in the abdomen. Passage of more gas than usual.  Walking can help get rid of the air that was put into your GI tract during the procedure and reduce the bloating. If you had a lower endoscopy (such as a colonoscopy or flexible sigmoidoscopy) you may notice spotting of blood in your stool or on the toilet paper. If you underwent a bowel prep for your procedure, you may not have a normal bowel movement for a few days.  Please Note:  You might notice some irritation and congestion in your nose or some drainage.  This is from the oxygen used during your procedure.  There is no need for concern and it should clear up in a day or so.  SYMPTOMS TO REPORT IMMEDIATELY:   Following lower endoscopy (colonoscopy or flexible sigmoidoscopy):  Excessive amounts of blood in the stool  Significant tenderness or worsening of abdominal pains  Swelling of the abdomen that is new, acute  Fever of 100F or higher   For urgent or emergent issues, a gastroenterologist can be reached at any hour by calling [phone].   DIET:  We do recommend a small meal at first, but then you may proceed to your regular diet.  Drink plenty of  fluids but you should avoid alcoholic beverages for 24 hours.  ACTIVITY:  You should plan to take it easy for the rest of today and you should NOT DRIVE or use heavy machinery until tomorrow (because of the sedation medicines used during the test).    FOLLOW UP: Our staff will call the number listed on your records 48-72 hours following your procedure to check on you and address any questions or concerns that you may have regarding the information given to you following your procedure. If we do not reach you, we will leave a message.  We will attempt to reach you two times.  During this call, we will ask if you have developed any symptoms of COVID 19. If you develop any symptoms (ie: fever, flu-like symptoms, shortness of breath, cough etc.) before then, please call [phone].  If you test positive for Covid 19 in the 2 weeks post procedure, please call and report this information to [place].    If any biopsies were taken you will be contacted by phone or by letter within the next 1-3 weeks.  Please call us at [phone] if you have not heard about the biopsies in 3 weeks.    SIGNATURES/CONFIDENTIALITY: You and/or your care partner have signed paperwork which will be entered into your electronic medical record.  These signatures attest to the fact that that the information above on your After Visit Summary has been reviewed and is understood.  Full responsibility of the confidentiality of this discharge information lies with you and/or your care-partner.

## 2018-12-26 NOTE — Progress Notes (Signed)
Vitals-CW Temp-JB  Pt's states no medical or surgical changes since previsit or office visit.  covid test negative.

## 2018-12-27 ENCOUNTER — Encounter: Payer: Self-pay | Admitting: Family Medicine

## 2018-12-27 ENCOUNTER — Ambulatory Visit (INDEPENDENT_AMBULATORY_CARE_PROVIDER_SITE_OTHER): Payer: BLUE CROSS/BLUE SHIELD | Admitting: Family Medicine

## 2018-12-27 VITALS — BP 121/80 | HR 61 | Temp 98.4°F | Wt 208.8 lb

## 2018-12-27 DIAGNOSIS — Z0001 Encounter for general adult medical examination with abnormal findings: Secondary | ICD-10-CM | POA: Diagnosis not present

## 2018-12-27 DIAGNOSIS — Z0000 Encounter for general adult medical examination without abnormal findings: Secondary | ICD-10-CM

## 2018-12-27 DIAGNOSIS — E291 Testicular hypofunction: Secondary | ICD-10-CM | POA: Diagnosis not present

## 2018-12-27 DIAGNOSIS — R739 Hyperglycemia, unspecified: Secondary | ICD-10-CM

## 2018-12-27 DIAGNOSIS — Z23 Encounter for immunization: Secondary | ICD-10-CM

## 2018-12-27 LAB — HEMOGLOBIN A1C
Est. average glucose Bld gHb Est-mCnc: 111 mg/dL
Hgb A1c MFr Bld: 5.5 % (ref 4.8–5.6)

## 2018-12-27 MED ORDER — SHINGRIX 50 MCG/0.5ML IM SUSR: 0.5000 mL | Freq: Once | INTRAMUSCULAR | 1 refills | Status: AC

## 2018-12-27 NOTE — Progress Notes (Signed)
Subjective:    Patient ID: Curtis Yang, male    DOB: 22-Jul-1960, 58 y.o.   MRN: [id number]  HPI Curtis Yang is a 58 y.o. male Presents today for: Chief Complaint  Patient presents with  . Annual Exam    here for his annual exam only have no other issues at this time. Was not sure if we were going to check his testosterone level again today or not   Here for annual exam. Med review telemedicine visit September 14.  Hypogonadism: Testosterone 200 mg inj every 3 weeks Last injection - due this weekend.  Labs performed 9/21. Injection 8/30   Results for orders placed or performed in visit on 11/18/18  Testosterone, Free, Total, SHBG  Result Value Ref Range   Testosterone 318 264 - 916 ng/dL   Testosterone, Free 3.9 (L) 7.2 - 24.0 pg/mL   Sex Hormone Binding 47.0 19.3 - 76.4 nmol/L  PSA  Result Value Ref Range   Prostate Specific Ag, Serum 1.2 0.0 - 4.0 ng/mL  Lipid panel  Result Value Ref Range   Cholesterol, Total 163 100 - 199 mg/dL   Triglycerides 182 (H) 0 - 149 mg/dL   HDL 48 >39 mg/dL   VLDL Cholesterol Cal 31 5 - 40 mg/dL   LDL Chol Calc (NIH) 84 0 - 99 mg/dL   Chol/HDL Ratio 3.4 0.0 - 5.0 ratio  Comprehensive metabolic panel  Result Value Ref Range   Glucose 160 (H) 65 - 99 mg/dL   BUN 11 6 - 24 mg/dL   Creatinine, Ser 1.07 0.76 - 1.27 mg/dL   GFR calc non Af Amer 76 >59 mL/min/1.73   GFR calc Af Amer 87 >59 mL/min/1.73   BUN/Creatinine Ratio 10 9 - 20   Sodium 140 134 - 144 mmol/L   Potassium 4.3 3.5 - 5.2 mmol/L   Chloride 100 96 - 106 mmol/L   CO2 27 20 - 29 mmol/L   Calcium 9.5 8.7 - 10.2 mg/dL   Total Protein 6.7 6.0 - 8.5 g/dL   Albumin 4.4 3.8 - 4.9 g/dL   Globulin, Total 2.3 1.5 - 4.5 g/dL   Albumin/Globulin Ratio 1.9 1.2 - 2.2   Bilirubin Total 0.5 0.0 - 1.2 mg/dL   Alkaline Phosphatase 85 39 - 117 IU/L   AST 19 0 - 40 IU/L   ALT 15 0 - 44 IU/L  CBC  Result Value Ref Range   WBC 8.4 3.4 - 10.8 x10E3/uL   RBC 4.59 4.14 - 5.80  x10E6/uL   Hemoglobin 14.4 13.0 - 17.7 g/dL   Hematocrit 43.7 37.5 - 51.0 %   MCV 95 79 - 97 fL   MCH 31.4 26.6 - 33.0 pg   MCHC 33.0 31.5 - 35.7 g/dL   RDW 13.4 11.6 - 15.4 %   Platelets 197 150 - 450 x10E3/uL   Hyperglycemia: Noted on recent labs.  Lab Results  Component Value Date   HGBA1C 5.6 10/08/2017   Cancer screening: Colonoscopy yesterday. Small polyp- recheck in 7 years.  Prostate: PSA as above in september normal. DRE deferred.  Skin: no recent eval - area on chest in past benign. No FH skin cancer.   Immunization History  Administered Date(s) Administered  . DTaP 07/04/2004, 11/14/2007  . Hepatitis A 04/20/2004, 09/26/2004  . Influenza, Seasonal, Injecte, Preservative Fre 01/24/2012  . Influenza,inj,Quad PF,6+ Mos 01/20/2013, 12/05/2013, 02/12/2015, 02/09/2016, 11/18/2018  . Influenza-Unspecified 03/29/2017  . PPD Test 04/04/2012  . Tdap 10/18/2011  . Zoster  01/21/2013  shingles: shingrix rx sent.   Depression screen [facility] 2/9 12/27/2018 11/11/2018 10/08/2017 08/13/2017 04/30/2017  Decreased Interest 0 0 0 0 0  Down, Depressed, Hopeless 0 0 0 0 0  PHQ - 2 Score 0 0 0 0 0    Hearing Screening   125Hz  250Hz  500Hz  1000Hz  2000Hz  3000Hz  4000Hz  6000Hz  8000Hz   Right ear:           Left ear:             Visual Acuity Screening   Right eye Left eye Both eyes  Without correction: 20/30 20/40 20/30   With correction:     optometry eval few weeks ago. Has glasses for distance - not used today.  Dental: every 6 months.   Exercise: walking few times per week. Active job.    Patient Active Problem List   Diagnosis Date Noted  . Essential hypertension 03/27/2016  . Lumbar stenosis 09/16/2015  . Gastroesophageal reflux disease without esophagitis 02/25/2014  . 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 Depression 03/15/2011  . Insomnia 03/15/2011  . Anxiety 03/15/2011  . Increased serum lipids 03/15/2011  . Low back pain 03/15/2011  . Erectile  dysfunction 03/15/2011  . Increased glucose level 03/15/2011  . Testosterone deficiency 03/15/2011  . History of colon polyps 03/15/2011   Past Medical History:  Diagnosis Date  . Allergy   . Anxiety   . Arthritis    DDD lumbar.  . Benign skin lesion of nose   . Depression   . Erectile dysfunction    Due to decreased testosterone   . GERD (gastroesophageal reflux disease)    NO TROUBLE IN YEARS  . History of colon polyps   . Hyperlipidemia   . Hypertension   . Hypogonadism male   . Increased glucose level   . Insomnia   . Low back pain    Past Surgical History:  Procedure Laterality Date  . BREAST MASS EXCISION     RIGHT CHEST  BENIGN  . COLONOSCOPY W/ POLYPECTOMY  09/2013   One polyp, came back pre-cancerous. Repeat 5 years unless symptoms present.   [name] EYE SURGERY  02/28/2000   Lasik  . MASS EXCISION     RIGHT EAR   BENIGN  . SPINE SURGERY  09/26/2012   [facility] [place].  . TRANSFORAMINAL LUMBAR INTERBODY FUSION (TLIF) WITH PEDICLE SCREW FIXATION 1 LEVEL Right 04/22/2013   Procedure: TRANSFORAMINAL LUMBAR INTERBODY FUSION (TLIF) WITH PEDICLE SCREW FIXATION LUMBAR FIVE SACRAL ONE;  Surgeon: Faythe Ghee, MD;  Location: [facility];  Service: Neurosurgery;  Laterality: Right;   No Known Allergies Prior to Admission medications   Medication Sig Start Date End Date Taking? Authorizing Provider  hydrOXYzine (ATARAX/VISTARIL) 25 MG tablet Take 1 tablet (25 mg total) by mouth every 8 (eight) hours as needed for anxiety. 11/11/18  Yes Wendie Agreste, MD  metoprolol succinate (TOPROL-XL) 100 MG 24 hr tablet TAKE 1 TABLET BY MOUTH TWICE DAILY WITH OR IMMEDIATELY FOLLOWING A MEAL 11/11/18  Yes Wendie Agreste, MD  PARoxetine (PAXIL) 40 MG tablet Take 1 tablet (40 mg total) by mouth daily. 11/11/18  Yes Wendie Agreste, MD  rosuvastatin (CRESTOR) 5 MG tablet Take 1 tablet (5 mg total) by mouth daily. 11/11/18  Yes Wendie Agreste, MD  SYRINGE-NEEDLE, DISP,  3 ML (VANISHPOINT SAFETY SYRINGE) 22G X 1-1/2" 3 ML MISC USE AS DIRECTED TO SELF-ADMINISTER TESTOSTERONE 11/11/18  Yes Wendie Agreste, MD  testosterone cypionate (DEPOTESTOSTERONE CYPIONATE) 200 MG/ML injection Every 3 weeks. 11/11/18  Yes Wendie Agreste, MD  traZODone (DESYREL) 50 MG tablet Take 1 tablet (50 mg total) by mouth at bedtime as needed for sleep. 11/11/18  Yes Wendie Agreste, MD   Social History   Socioeconomic History  . Marital status: Married    Spouse name: Not on file  . Number of children: Not on file  . Years of education: Not on file  . Highest education level: Not on file  Occupational History  . Not on file  Social Needs  . Financial resource strain: Not on file  . Food insecurity    Worry: Not on file    Inability: Not on file  . Transportation needs    Medical: Not on file    Non-medical: Not on file  Tobacco Use  . Smoking status: Never Smoker  . Smokeless tobacco: Never Used  Substance and Sexual Activity  . Alcohol use: No  . Drug use: No  . Sexual activity: Yes    Comment: number of sex partners in the last 12 months  1  Lifestyle  . Physical activity    Days per week: Not on file    Minutes per session: Not on file  . Stress: Not on file  Relationships  . Social Herbalist on phone: Not on file    Gets together: Not on file    Attends religious service: Not on file    Active member of club or organization: Not on file    Attends meetings of clubs or organizations: Not on file    Relationship status: Not on file  . Intimate partner violence    Fear of current or ex partner: Not on file    Emotionally abused: Not on file    Physically abused: Not on file    Forced sexual activity: Not on file  Other Topics Concern  . Not on file  Social History Narrative   Marital status:  Married x 5 years; happily married; second marriage; first wife died AMI age 61.        Children:  1 child; 3 stepchildren.  Several grandchildren.       Lives: with wife.      Employment:  [employer]; Print production planner x 19 years; happy.  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        Tobacco: never       Alcohol:  None      Drugs: none      Exercise:  Walking twice daily 2-3 miles total each day.  Job physically demanding.        Seatbelt: 100%; no texting      Guns: yes; unloaded; in safe.      Sunscreen: SPF on face.    Review of Systems 13 point review of systems per patient health survey noted.  Negative other than as indicated above or in HPI.      Objective:   Physical Exam Vitals signs reviewed.  Constitutional:      Appearance: He is well-developed.  HENT:     Head: Normocephalic and atraumatic.     Right Ear: External ear normal.     Left Ear: External ear normal.  Eyes:     Conjunctiva/sclera: Conjunctivae normal.     Pupils: Pupils are equal, round, and reactive to light.  Neck:     Musculoskeletal: Normal range of motion and neck supple.  Thyroid: No thyromegaly.  Cardiovascular:     Rate and Rhythm: Normal rate and regular rhythm.     Heart sounds: Normal heart sounds.  Pulmonary:     Effort: Pulmonary effort is normal. No respiratory distress.     Breath sounds: Normal breath sounds. No wheezing.  Abdominal:     General: There is no distension.     Palpations: Abdomen is soft.     Tenderness: There is no abdominal tenderness.  Musculoskeletal: Normal range of motion.        General: No tenderness.  Lymphadenopathy:     Cervical: No cervical adenopathy.  Skin:    General: Skin is warm and dry.  Neurological:     Mental Status: He is alert and oriented to person, place, and time.     Deep Tendon Reflexes: Reflexes are normal and symmetric.  Psychiatric:        Behavior: Behavior normal.    Vitals:   12/27/18 0836  BP: 121/80  Pulse: 61  Temp: 98.4 F (36.9 C)  TempSrc: Oral  SpO2: 98%  Weight: 208 lb 12.8 oz (94.7 kg)       Assessment & Plan:   Curtis Yang is a 58 y.o.  male Annual physical exam  - -anticipatory guidance as below in AVS, screening labs above. Health maintenance items as above in HPI discussed/recommended as applicable.   Hyperglycemia - Plan: Hemoglobin A1c  -Exercise, weight management discussed, check A1c  Hypogonadism male  -Option of every 2-week dosing with a lower dose as option but doing well currently with current regimen.  Follow-up in approximately 5 months and at that time testing mid injections ideally.  Need for shingles vaccine - Plan: Zoster Vaccine Adjuvanted [facility]) injection Sent to pharmacy  Meds ordered this encounter  Medications  . Zoster Vaccine Adjuvanted [facility]) injection    Sig: Inject 0.5 mLs into the muscle once for 1 dose. Repeat in 2-6 months.    Dispense:  0.5 mL    Refill:  1   Patient Instructions   No med changes. I will check diabetes screening test. Recheck in 5 months - mid testosterone dosing if possible.   Keeping you healthy  Get these tests  Blood pressure- Have your blood pressure checked once a year by your healthcare provider.  Normal blood pressure is 120/80  Weight- Have your body mass index (BMI) calculated to screen for obesity.  BMI is a measure of body fat based on height and weight. You can also calculate your own BMI at [email or web address].  Cholesterol- Have your cholesterol checked every year.  Diabetes- Have your blood sugar checked regularly if you have high blood pressure, high cholesterol, have a family history of diabetes or if you are overweight.  Screening for Colon Cancer- Colonoscopy starting at age 43.  Screening may begin sooner depending on your family history and other health conditions. Follow up colonoscopy as directed by your Gastroenterologist.  Screening for Prostate Cancer- Both blood work (PSA) and a rectal exam help screen for Prostate Cancer.  Screening begins at age 60 with African-American men and at age 39 with Caucasian men.  Screening  may begin sooner depending on your family history.  Take these medicines  Aspirin- One aspirin daily can help prevent Heart disease and Stroke.  Flu shot- Every fall.  Tetanus- Every 10 years.  Zostavax- Once after the age of 93 to prevent Shingles.  Pneumonia shot- Once after the age of 30; if you are younger than  63, ask your healthcare provider if you need a Pneumonia shot.  Take these steps  Don't smoke- If you do smoke, talk to your doctor about quitting.  For tips on how to quit, go to www.smokefree.gov or call 1-800-QUIT-NOW.  Be physically active- Exercise 5 days a week for at least 30 minutes.  If you are not already physically active start slow and gradually work up to 30 minutes of moderate physical activity.  Examples of moderate activity include walking briskly, mowing the yard, dancing, swimming, bicycling, etc.  Eat a healthy diet- Eat a variety of healthy food such as fruits, vegetables, low fat milk, low fat cheese, yogurt, lean meant, poultry, fish, beans, tofu, etc. For more information go to www.thenutritionsource.org  Drink alcohol in moderation- Limit alcohol intake to less than two drinks a day. Never drink and drive.  Dentist- Brush and floss twice daily; visit your dentist twice a year.  Depression- Your emotional health is as important as your physical health. If you're feeling down, or losing interest in things you would normally enjoy please talk to your healthcare provider.  Eye exam- Visit your eye doctor every year.  Safe sex- If you may be exposed to a sexually transmitted infection, use a condom.  Seat belts- Seat belts can save your life; always wear one.  Smoke/Carbon Monoxide detectors- These detectors need to be installed on the appropriate level of your home.  Replace batteries at least once a year.  Skin cancer- When out in the sun, cover up and use sunscreen 15 SPF or higher.  Violence- If anyone is threatening you, please tell your  healthcare provider. Living Will/ Health care power of attorney- Speak with your healthcare provider and family.  If you have lab work done today you will be contacted with your lab results within the next 2 weeks.  If you have not heard from [place] then please contact us. The fastest way to get your results is to register for My Chart.   IF you received an x-ray today, you will receive an invoice from [facility]. Please contact [facility] at [phone] with questions or concerns regarding your invoice.   IF you received labwork today, you will receive an invoice from [place]. Please contact [facility] at [phone] with questions or concerns regarding your invoice.   Our billing staff will not be able to assist you with questions regarding bills from these companies.  You will be contacted with the lab results as soon as they are available. The fastest way to get your results is to activate your My Chart account. Instructions are located on the last page of this paperwork. If you have not heard from [place] regarding the results in 2 weeks, please contact this office.       Signed,   Merri Ray, MD [facility] [place].  12/27/18 12:40 PM

## 2018-12-27 NOTE — Patient Instructions (Addendum)
No med changes. I will check diabetes screening test. Recheck in 5 months - mid testosterone dosing if possible.   Keeping you healthy  Get these tests  Blood pressure- Have your blood pressure checked once a year by your healthcare provider.  Normal blood pressure is 120/80  Weight- Have your body mass index (BMI) calculated to screen for obesity.  BMI is a measure of body fat based on height and weight. You can also calculate your own BMI at [email or web address].  Cholesterol- Have your cholesterol checked every year.  Diabetes- Have your blood sugar checked regularly if you have high blood pressure, high cholesterol, have a family history of diabetes or if you are overweight.  Screening for Colon Cancer- Colonoscopy starting at age 64.  Screening may begin sooner depending on your family history and other health conditions. Follow up colonoscopy as directed by your Gastroenterologist.  Screening for Prostate Cancer- Both blood work (PSA) and a rectal exam help screen for Prostate Cancer.  Screening begins at age 62 with African-American men and at age 39 with Caucasian men.  Screening may begin sooner depending on your family history.  Take these medicines  Aspirin- One aspirin daily can help prevent Heart disease and Stroke.  Flu shot- Every fall.  Tetanus- Every 10 years.  Zostavax- Once after the age of 62 to prevent Shingles.  Pneumonia shot- Once after the age of 33; if you are younger than 32, ask your healthcare provider if you need a Pneumonia shot.  Take these steps  Don't smoke- If you do smoke, talk to your doctor about quitting.  For tips on how to quit, go to www.smokefree.gov or call 1-800-QUIT-NOW.  Be physically active- Exercise 5 days a week for at least 30 minutes.  If you are not already physically active start slow and gradually work up to 30 minutes of moderate physical activity.  Examples of moderate activity include walking briskly, mowing the yard,  dancing, swimming, bicycling, etc.  Eat a healthy diet- Eat a variety of healthy food such as fruits, vegetables, low fat milk, low fat cheese, yogurt, lean meant, poultry, fish, beans, tofu, etc. For more information go to www.thenutritionsource.org  Drink alcohol in moderation- Limit alcohol intake to less than two drinks a day. Never drink and drive.  Dentist- Brush and floss twice daily; visit your dentist twice a year.  Depression- Your emotional health is as important as your physical health. If you're feeling down, or losing interest in things you would normally enjoy please talk to your healthcare provider.  Eye exam- Visit your eye doctor every year.  Safe sex- If you may be exposed to a sexually transmitted infection, use a condom.  Seat belts- Seat belts can save your life; always wear one.  Smoke/Carbon Monoxide detectors- These detectors need to be installed on the appropriate level of your home.  Replace batteries at least once a year.  Skin cancer- When out in the sun, cover up and use sunscreen 15 SPF or higher.  Violence- If anyone is threatening you, please tell your healthcare provider. Living Will/ Health care power of attorney- Speak with your healthcare provider and family.  If you have lab work done today you will be contacted with your lab results within the next 2 weeks.  If you have not heard from [place] then please contact us. The fastest way to get your results is to register for My Chart.   IF you received an x-ray today, you will receive an invoice  from [facility]. Please contact [facility] at [phone] with questions or concerns regarding your invoice.   IF you received labwork today, you will receive an invoice from [place]. Please contact [facility] at [phone] with questions or concerns regarding your invoice.   Our billing staff will not be able to assist you with questions regarding bills from these companies.  You will be  contacted with the lab results as soon as they are available. The fastest way to get your results is to activate your My Chart account. Instructions are located on the last page of this paperwork. If you have not heard from [place] regarding the results in 2 weeks, please contact this office.

## 2018-12-30 ENCOUNTER — Telehealth: Payer: Self-pay

## 2018-12-30 ENCOUNTER — Telehealth: Payer: Self-pay | Admitting: *Deleted

## 2018-12-30 NOTE — Telephone Encounter (Signed)
  Follow up Call-  Call back number 12/26/2018  Post procedure Call Back phone  # [phone]  Permission to leave phone message Yes  Some recent data might be hidden    LMOM to call back with any questions or concerns.  Also, call back if patient has developed fever, respiratory issues or been dx with COVID or had any family members or close contacts diagnosed since her procedure.

## 2018-12-30 NOTE — Telephone Encounter (Signed)
Left message on follow up call. 

## 2019-01-02 ENCOUNTER — Encounter: Payer: Self-pay | Admitting: Gastroenterology

## 2019-02-08 DIAGNOSIS — Z20828 Contact with and (suspected) exposure to other viral communicable diseases: Secondary | ICD-10-CM | POA: Diagnosis not present

## 2019-02-08 DIAGNOSIS — R05 Cough: Secondary | ICD-10-CM | POA: Diagnosis not present

## 2019-02-26 ENCOUNTER — Encounter: Payer: Self-pay | Admitting: Family Medicine

## 2019-02-27 ENCOUNTER — Ambulatory Visit (INDEPENDENT_AMBULATORY_CARE_PROVIDER_SITE_OTHER)
Admission: RE | Admit: 2019-02-27 | Discharge: 2019-02-27 | Disposition: A | Discharge status: Home / Self Care | Payer: BC Managed Care – PPO | Source: Ambulatory Visit

## 2019-02-27 DIAGNOSIS — R059 Cough, unspecified: Secondary | ICD-10-CM

## 2019-02-27 DIAGNOSIS — U071 COVID-19: Secondary | ICD-10-CM

## 2019-02-27 DIAGNOSIS — R05 Cough: Secondary | ICD-10-CM

## 2019-02-27 MED ORDER — BENZONATATE 100 MG PO CAPS: 100.0000 mg | ORAL_CAPSULE | Freq: Three times a day (TID) | ORAL | 0 refills | Status: DC

## 2019-02-27 NOTE — Discharge Instructions (Signed)
Take the [facility] as needed for your cough.    Come in to be seen in person or follow-up with your primary care provider if your symptoms are not improving or if you develop new symptoms such as fever or shortness of breath.

## 2019-02-27 NOTE — ED Provider Notes (Signed)
Virtual Visit via Video Note:  DRISTON ADERHOLT  initiated request for Telemedicine visit with [facility] team. I connected with Curtis Yang  on 02/27/2019 at 2:15 PM  for a synchronized telemedicine visit using a video enabled HIPPA compliant telemedicine application. I verified that I am speaking with Curtis Yang  using two identifiers. Sharion Balloon, NP  was physically located in a [facility] Urgent care site and MADSEN FROHN was located at a different location.   The limitations of evaluation and management by telemedicine as well as the availability of in-person appointments were discussed. Patient was informed that he  may incur a bill ( including co-pay) for this virtual visit encounter. Curtis Yang  expressed understanding and gave verbal consent to proceed with virtual visit.     History of Present Illness:Curtis Yang  is a 58 y.o. male presents for evaluation of cough productive of green sputum, headache, and nausea.  He states he tested COVID positive on 02/08/2019.  He denies fever, chills, sore throat, rash, shortness of breath, vomiting, diarrhea, or other symptoms.  He has been treating his symptoms at home with Nyquil.     No Known Allergies   Past Medical History:  Diagnosis Date  . Allergy   . Anxiety   . Arthritis    DDD lumbar.  . Benign skin lesion of nose   . Depression   . Erectile dysfunction    Due to decreased testosterone   . GERD (gastroesophageal reflux disease)    NO TROUBLE IN YEARS  . History of colon polyps   . Hyperlipidemia   . Hypertension   . Hypogonadism male   . Increased glucose level   . Insomnia   . Low back pain      Social History   Tobacco Use  . Smoking status: Never Smoker  . Smokeless tobacco: Never Used  Substance Use Topics  . Alcohol use: No  . Drug use: No        Observations/Objective: Physical Exam  VITALS: Patient denies fever. GENERAL: Alert, appears well and in no  acute distress. HEENT: Atraumatic. NECK: Normal movements of the head and neck. CARDIOPULMONARY: No increased WOB. Speaking in clear sentences. I:E ratio WNL.  MS: Moves all visible extremities without noticeable abnormality. PSYCH: Pleasant and cooperative, well-groomed. Speech normal rate and rhythm. Affect is appropriate. Insight and judgement are appropriate. Attention is focused, linear, and appropriate.  NEURO: CN grossly intact. Oriented as arrived to appointment on time with no prompting. Moves both UE equally.  SKIN: No obvious lesions, wounds, erythema, or cyanosis noted on face or hands.   Assessment and Plan:    ICD-10-CM   1. Cough  R05   2. COVID-19  U07.1        Follow Up Instructions: Treating cough with Tessalon Perles.  Instructed patient to come in to be seen in person either here at the urgent care or with his primary care provider if his symptoms are not improving or he develops new symptoms such as fever or shortness of breath.  Patient agrees to plan of care.    I discussed the assessment and treatment plan with the patient. The patient was provided an opportunity to ask questions and all were answered. The patient agreed with the plan and demonstrated an understanding of the instructions.   The patient was advised to call back or seek an in-person evaluation if the symptoms worsen or if the condition  fails to improve as anticipated.      Sharion Balloon, NP  02/27/2019 2:15 PM         Sharion Balloon, NP 02/27/19 1415

## 2019-03-13 ENCOUNTER — Encounter: Payer: Self-pay | Admitting: Family Medicine

## 2019-03-14 ENCOUNTER — Encounter: Payer: Self-pay | Admitting: Family Medicine

## 2019-03-21 ENCOUNTER — Other Ambulatory Visit: Payer: Self-pay | Admitting: Family Medicine

## 2019-03-21 DIAGNOSIS — F419 Anxiety disorder, unspecified: Secondary | ICD-10-CM

## 2019-03-21 DIAGNOSIS — E291 Testicular hypofunction: Secondary | ICD-10-CM

## 2019-03-26 ENCOUNTER — Ambulatory Visit (INDEPENDENT_AMBULATORY_CARE_PROVIDER_SITE_OTHER): Payer: BC Managed Care – PPO

## 2019-03-26 ENCOUNTER — Other Ambulatory Visit: Payer: Self-pay

## 2019-03-26 ENCOUNTER — Telehealth (INDEPENDENT_AMBULATORY_CARE_PROVIDER_SITE_OTHER): Payer: BC Managed Care – PPO | Admitting: Family Medicine

## 2019-03-26 ENCOUNTER — Ambulatory Visit: Payer: Self-pay

## 2019-03-26 ENCOUNTER — Ambulatory Visit: Payer: BC Managed Care – PPO

## 2019-03-26 VITALS — Ht 72.0 in | Wt 200.0 lb

## 2019-03-26 DIAGNOSIS — R05 Cough: Secondary | ICD-10-CM

## 2019-03-26 DIAGNOSIS — R0781 Pleurodynia: Secondary | ICD-10-CM

## 2019-03-26 DIAGNOSIS — R079 Chest pain, unspecified: Secondary | ICD-10-CM

## 2019-03-26 DIAGNOSIS — R059 Cough, unspecified: Secondary | ICD-10-CM

## 2019-03-26 DIAGNOSIS — Z8616 Personal history of COVID-19: Secondary | ICD-10-CM

## 2019-03-26 MED ORDER — BENZONATATE 100 MG PO CAPS: 100.0000 mg | ORAL_CAPSULE | Freq: Three times a day (TID) | ORAL | 0 refills | Status: DC | PRN

## 2019-03-26 NOTE — Patient Instructions (Signed)
Tessalon Perles as needed for cough.  Will check to the blood clot screening test today and if elevated may need to order a CT scan of your chest.  We will also check an x-ray today to rule out fractures or pneumonia.  If any acute worsening of symptoms including worsening chest pain, shortness of breath or other worsening symptoms, proceed to the emergency room.   Pleurisy  Pleurisy, also called pleuritis, is irritation and swelling (inflammation) of the linings of the lungs. The linings of the lungs are called pleura. They cover the outside of the lungs and the inside of the chest wall. There is a small amount of fluid (pleural fluid) between the pleura that allows the lungs to move in and out smoothly when you breathe. Pleurisy causes the pleura to be rough and dry and to rub together when you breathe, which is painful. In some cases, pleurisy can cause pleural fluid to build up between the pleura (pleural effusion). What are the causes? Common causes of this condition include:  A lung infection caused by bacteria or a virus.  A blood clot that travels to the lung (pulmonary embolism).  Air leaking into the pleural space (pneumothorax).  Lung cancer or a lung tumor.  A chest injury.  Diseases that can cause lung inflammation. These include rheumatoid arthritis, lupus, sickle cell disease, inflammatory bowel disease, and pancreatitis.  Heart or chest surgery.  Lung damage from inhaling asbestos.  A lung reaction to certain medicines. Sometimes the cause is unknown. What are the signs or symptoms? Chest pain is the main symptom of this condition. The pain is usually on one side. Chest pain may start suddenly and be sharp or stabbing. It may become a constant dull ache. You may also feel pain in your back or shoulder. The pain may get worse when you cough, take deep breaths, or make sudden movements. Other symptoms may include:  Shortness of breath.  Noisy breathing  (wheezing).  Cough.  Chills.  Fever. How is this diagnosed? This condition may be diagnosed based on:  Your medical history.  Your symptoms.  A physical exam. Your health care provider will listen to your breathing with a stethoscope to check for a rough, rubbing sound (friction rub). If you have pleural effusion, your breathing sounds may be muffled.  Tests, such as: ? Blood tests to check for infections or diseases and to measure the oxygen in your blood. ? Imaging studies of your lungs. These may include a chest X-ray, ultrasound, MRI, or CT scan. ? A procedure to remove pleural fluid with a needle for testing (thoracentesis). How is this treated? Treatment for this condition depends on the cause. Pleurisy that was caused by a virus usually clears up within 2 weeks. Treatment for pleurisy may include:  NSAIDs to help relieve pain and swelling.  Antibiotic medicines, if your condition was caused by a bacterial infection.  Prescription pain or cough medicine.  Medicines to dissolve a blood clot, if your condition was caused by pulmonary embolism.  Removal of pleural fluid or air. Follow these instructions at home: Medicines  Take over-the-counter and prescription medicines only as told by your health care provider.  If you were prescribed an antibiotic, take it as told by your health care provider. Do not stop taking the antibiotic even if you start to feel better. Activity  Rest and return to your normal activities as told by your health care provider. Ask your health care provider what activities are safe  for you.  Do not drive or use heavy machinery while taking prescription pain medicine. General instructions   Monitor your pleurisy for any changes.  Take deep breaths often, even if it is painful. This can help prevent lung infection (pneumonia) and collapse of lung tissue (atelectasis).  When lying down, lie on your painful side. This may reduce pain.  Do not  smoke. If you need help quitting, ask your health care provider.  Keep all follow-up visits as told by your health care provider. This is important. Contact a health care provider if:  You have pain that: ? Gets worse. ? Does not get better with medicine. ? Lasts for more than 1 week.  You have a fever or chills.  Your cough or shortness of breath is not improving at home.  You cough up pus-like (purulent) secretions. Get help right away if:  Your lips, fingernails, or toenails darken or turn blue.  You cough up blood.  You have any of the following symptoms that get worse: ? Difficulty breathing. ? Shortness of breath. ? Wheezing.  You have pain that spreads into your neck, arms, or jaw.  You develop a rash.  You vomit.  You faint. Summary  Pleurisy is inflammation of the linings of the lungs (pleura).  Pleurisy causes pain that makes it difficult for you to breathe or cough.  Pleurisy is often caused by an underlying infection or disease.  Treatment of pleurisy depends on the cause, and it often includes medicines. This information is not intended to replace advice given to you by your health care provider. Make sure you discuss any questions you have with your health care provider. Document Revised: 01/26/2017 Document Reviewed: 11/08/2015 Elsevier Patient Education  [place].  Cough, Adult A cough helps to clear your throat and lungs. A cough may be a sign of an illness or another medical condition. An acute cough may only last 2-3 weeks, while a chronic cough may last 8 or more weeks. Many things can cause a cough. They include:  Germs (viruses or bacteria) that attack the airway.  Breathing in things that bother (irritate) your lungs.  Allergies.  Asthma.  Mucus that runs down the back of your throat (postnasal drip).  Smoking.  Acid backing up from the stomach into the tube that moves food from the mouth to the stomach (gastroesophageal  reflux).  Some medicines.  Lung problems.  Other medical conditions, such as heart failure or a blood clot in the lung (pulmonary embolism). Follow these instructions at home: Medicines  Take over-the-counter and prescription medicines only as told by your doctor.  Talk with your doctor before you take medicines that stop a cough (coughsuppressants). Lifestyle   Do not smoke, and try not to be around smoke. Do not use any products that contain nicotine or tobacco, such as cigarettes, e-cigarettes, and chewing tobacco. If you need help quitting, ask your doctor.  Drink enough fluid to keep your pee (urine) pale yellow.  Avoid caffeine.  Do not drink alcohol if your doctor tells you not to drink. General instructions   Watch for any changes in your cough. Tell your doctor about them.  Always cover your mouth when you cough.  Stay away from things that make you cough, such as perfume, candles, campfire smoke, or cleaning products.  If the air is dry, use a cool mist vaporizer or humidifier in your home.  If your cough is worse at night, try using extra pillows to  raise your head up higher while you sleep.  Rest as needed.  Keep all follow-up visits as told by your doctor. This is important. Contact a doctor if:  You have new symptoms.  You cough up pus.  Your cough does not get better after 2-3 weeks, or your cough gets worse.  Cough medicine does not help your cough and you are not sleeping well.  You have pain that gets worse or pain that is not helped with medicine.  You have a fever.  You are losing weight and you do not know why.  You have night sweats. Get help right away if:  You cough up blood.  You have trouble breathing.  Your heartbeat is very fast. These symptoms may be an emergency. Do not wait to see if the symptoms will go away. Get medical help right away. Call your local emergency services (911 in the [place] Do not drive yourself to the  hospital. Summary  A cough helps to clear your throat and lungs. Many things can cause a cough.  Take over-the-counter and prescription medicines only as told by your doctor.  Always cover your mouth when you cough.  Contact a doctor if you have new symptoms or you have a cough that does not get better or gets worse. This information is not intended to replace advice given to you by your health care provider. Make sure you discuss any questions you have with your health care provider. Document Revised: 03/04/2018 Document Reviewed: 03/04/2018 Elsevier Patient Education  [place].

## 2019-03-26 NOTE — Progress Notes (Signed)
Virtual Visit via Video Note  I connected with Curtis Yang on 03/26/19 at 8:55 AM by a video enabled telemedicine application [employer] and verified that I am speaking with the correct person using two identifiers.   I discussed the limitations, risks, security and privacy concerns of performing an evaluation and management service by telephone and the availability of in person appointments. I also discussed with the patient that there may be a patient responsible charge related to this service. The patient expressed understanding and agreed to proceed, consent obtained.   Chief complaint:  Chief Complaint  Patient presents with  . Flank Pain    pt's states his R lung hurts when he coughs, sneezes, or takes a deep breth. Pt had COVID-19 last month tested positive on the 02/08/19. acheing and throbing pain. the pain has been going on since his positive covid last month. pt still has a cough.     History of Present Illness: Curtis Yang is a 59 y.o. male  History of COVID-19 infection, initial symptoms 12/11, positive test December 12th - at [facility]. No hospitalization - treated outpatient with cough suppressant.  Still some intermittent cough. Occasional clear phlegm only.  No fevers.  No dyspnea.  Some pain in R chest with deep breath yesterday. Soreness on the right side of his lungs with coughing or sneezing, or taking deep breaths. R sided chest pain since about 4-5 weeks. Not improving. Unable to push on area of pain.  No recent air travel, no recent calf pain or swelling. Does drive long distances, but takes breaks.  Tx: nyquil at night, occasional alleve.      Patient Active Problem List   Diagnosis Date Noted  . Essential hypertension 03/27/2016  . Lumbar stenosis 09/16/2015  . Gastroesophageal reflux disease without esophagitis 02/25/2014  . 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 Depression 03/15/2011  . Insomnia 03/15/2011  . Anxiety 03/15/2011   . Increased serum lipids 03/15/2011  . Low back pain 03/15/2011  . Erectile dysfunction 03/15/2011  . Increased glucose level 03/15/2011  . Testosterone deficiency 03/15/2011  . History of colon polyps 03/15/2011   Past Medical History:  Diagnosis Date  . Allergy   . Anxiety   . Arthritis    DDD lumbar.  . Benign skin lesion of nose   . Depression   . Erectile dysfunction    Due to decreased testosterone   . GERD (gastroesophageal reflux disease)    NO TROUBLE IN YEARS  . History of colon polyps   . Hyperlipidemia   . Hypertension   . Hypogonadism male   . Increased glucose level   . Insomnia   . Low back pain    Past Surgical History:  Procedure Laterality Date  . BREAST MASS EXCISION     RIGHT CHEST  BENIGN  . COLONOSCOPY W/ POLYPECTOMY  09/2013   One polyp, came back pre-cancerous. Repeat 5 years unless symptoms present.   [name] EYE SURGERY  02/28/2000   Lasik  . MASS EXCISION     RIGHT EAR   BENIGN  . SPINE SURGERY  09/26/2012   [facility] [place].  . TRANSFORAMINAL LUMBAR INTERBODY FUSION (TLIF) WITH PEDICLE SCREW FIXATION 1 LEVEL Right 04/22/2013   Procedure: TRANSFORAMINAL LUMBAR INTERBODY FUSION (TLIF) WITH PEDICLE SCREW FIXATION LUMBAR FIVE SACRAL ONE;  Surgeon: Faythe Ghee, MD;  Location: [facility];  Service: Neurosurgery;  Laterality: Right;   No Known Allergies Prior to Admission medications  Medication Sig Start Date End Date Taking? Authorizing Provider  hydrOXYzine (ATARAX/VISTARIL) 25 MG tablet Take 1 tablet (25 mg total) by mouth every 8 (eight) hours as needed for anxiety. 11/11/18  Yes Wendie Agreste, MD  metoprolol succinate (TOPROL-XL) 100 MG 24 hr tablet TAKE 1 TABLET BY MOUTH TWICE DAILY WITH OR IMMEDIATELY FOLLOWING A MEAL 11/11/18  Yes Wendie Agreste, MD  PARoxetine (PAXIL) 40 MG tablet Take 1 tablet (40 mg total) by mouth daily. 11/11/18  Yes Wendie Agreste, MD  rosuvastatin (CRESTOR) 5 MG tablet Take 1 tablet (5 mg  total) by mouth daily. 11/11/18  Yes Wendie Agreste, MD  SYRINGE-NEEDLE, DISP, 3 ML (VANISHPOINT SAFETY SYRINGE) 22G X 1-1/2" 3 ML MISC USE AS DIRECTED TO SELF-ADMINISTER TESTOSTERONE 11/11/18  Yes Wendie Agreste, MD  testosterone cypionate (DEPOTESTOSTERONE CYPIONATE) 200 MG/ML injection Every 3 weeks. 11/11/18  Yes Wendie Agreste, MD  traZODone (DESYREL) 50 MG tablet TAKE 1 TABLET(50 MG) BY MOUTH AT BEDTIME AS NEEDED FOR SLEEP 03/21/19  Yes Wendie Agreste, MD  benzonatate (TESSALON) 100 MG capsule Take 1 capsule (100 mg total) by mouth every 8 (eight) hours. Patient not taking: Reported on 03/26/2019 02/27/19   Sharion Balloon, NP   Social History   Socioeconomic History  . Marital status: Married    Spouse name: Not on file  . Number of children: Not on file  . Years of education: Not on file  . Highest education level: Not on file  Occupational History  . Not on file  Tobacco Use  . Smoking status: Never Smoker  . Smokeless tobacco: Never Used  Substance and Sexual Activity  . Alcohol use: No  . Drug use: No  . Sexual activity: Yes    Comment: number of sex partners in the last 12 months  1  Other Topics Concern  . Not on file  Social History Narrative   Marital status:  Married x 85 years; happily married; second marriage; first wife died AMI age 54.        Children:  1 child; 3 stepchildren.  Several grandchildren.      Lives: with wife.      Employment:  [employer]; Print production planner x 19 years; happy.  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        Tobacco: never       Alcohol:  None      Drugs: none      Exercise:  Walking twice daily 2-3 miles total each day.  Job physically demanding.        Seatbelt: 100%; no texting      Guns: yes; unloaded; in safe.      Sunscreen: SPF on face.   Social Determinants of Health   Financial Resource Strain:   . Difficulty of Paying Living Expenses: Not on file  Food Insecurity:   . Worried About Charity fundraiser in  the Last Year: Not on file  . Ran Out of Food in the Last Year: Not on file  Transportation Needs:   . Lack of Transportation (Medical): Not on file  . Lack of Transportation (Non-Medical): Not on file  Physical Activity:   . Days of Exercise per Week: Not on file  . Minutes of Exercise per Session: Not on file  Stress:   . Feeling of Stress : Not on file  Social Connections:   . Frequency of Communication with Friends and Family: Not on file  .  Frequency of Social Gatherings with Friends and Family: Not on file  . Attends Religious Services: Not on file  . Active Member of Clubs or Organizations: Not on file  . Attends Archivist Meetings: Not on file  . Marital Status: Not on file  Intimate Partner Violence:   . Fear of Current or Ex-Partner: Not on file  . Emotionally Abused: Not on file  . Physically Abused: Not on file  . Sexually Abused: Not on file    Observations/Objective: Vitals:   03/26/19 [id number]  Weight: 200 lb (90.7 kg)  Height: 6' (1.829 m)  Speak in full sentences, not dyspneic on exam, nontoxic appearing.  Appropriate responses, all questions answered.  Had patient self exam with palpation along chest wall without any focal tenderness.  DG Chest 1 View  Result Date: 03/26/2019 CLINICAL DATA:  Persistent cough, pleuritic chest pain and RIGHT rib pain of unknown origin since COVID in December 2020 EXAM: CHEST 1 VIEW (LATERAL) COMPARISON:  Earlier chest and rib radiographs of 03/26/2019 FINDINGS: Lateral view demonstrates normal heart size. Lungs clear. No pleural effusion or pneumothorax. Osseous structures unremarkable. IMPRESSION: No acute abnormalities. Electronically Signed   By: Lavonia Dana M.D.   On: 03/26/2019 13:57   DG Ribs Unilateral W/Chest Right  Result Date: 03/26/2019 CLINICAL DATA:  Persistent cough. Coronavirus infection in December. Right-sided chest pain. EXAM: RIGHT RIBS AND CHEST - 3+ VIEW COMPARISON:  04/04/2012 FINDINGS: Heart size is  normal. Mediastinal shadows are normal. The lungs are clear. No bronchial thickening. No infiltrate, mass, effusion or collapse. Pulmonary vascularity is normal. No bony abnormality. Rib detail films do not show a rib fracture. Bilateral nipple shadows are noted. IMPRESSION: No active disease. Electronically Signed   By: Nelson Chimes M.D.   On: 03/26/2019 13:55    Assessment and Plan: Cough - Plan: benzonatate (TESSALON) 100 MG capsule, DG Chest 1 View  History of COVID-19 - Plan: D-dimer, quantitative (not at [facility])  Pleuritic chest pain - Plan: D-dimer, quantitative (not at [facility]), DG Ribs Unilateral W/Chest Right, DG Chest 1 View  Right-sided chest pain - Plan: D-dimer, quantitative (not at [facility]), DG Ribs Unilateral W/Chest Right, DG Chest 1 View  Possible postinfectious cough but with right-sided chest pain, pleuritic pain and history of driving will check D-dimer, consider CT chest to rule out pulmonary embolus, especially with history of COVID-19 infection.  Check chest x-ray with rib series today to rule out rib fracture or infiltrate.  Tessalon Perles for cough as needed with ER/RTC precautions, other symptomatic care discussed.  X-ray reassuring.  D-dimer ordered as above.  ER precautions given.   Follow Up Instructions: X-ray, lab visit today, follow-up to be determined   I discussed the assessment and treatment plan with the patient. The patient was provided an opportunity to ask questions and all were answered. The patient agreed with the plan and demonstrated an understanding of the instructions.   The patient was advised to call back or seek an in-person evaluation if the symptoms worsen or if the condition fails to improve as anticipated.  I provided 9 minutes of non-face-to-face time during this encounter.   Wendie Agreste, MD

## 2019-03-26 NOTE — Telephone Encounter (Signed)
Plan discussed December 27, 2018, office visit scheduled in March.  Refills were ordered for testosterone every 3 weeks.  2 refills ordered, should be sufficient quantities until follow-up visit.

## 2019-03-26 NOTE — Telephone Encounter (Signed)
Patient is requesting a refill of the following medications: Requested Prescriptions   Pending Prescriptions Disp Refills   testosterone cypionate (DEPOTESTOSTERONE CYPIONATE) 200 MG/ML injection [Pharmacy Med Name: TESTOSTERONE CYP 200MG /ML SDV 1ML] 1 mL     Sig: INJECT AS DIRECTED EVERY 3 WEEKS    Date of patient request: 03/21/19 Last office visit: 03/26/19 Date of last refill: 11/11/18 Last refill amount: 4 ml 1Rf Follow up time period per chart: 05/28/19

## 2019-04-02 ENCOUNTER — Encounter: Payer: Self-pay | Admitting: Family Medicine

## 2019-04-07 ENCOUNTER — Encounter: Payer: Self-pay | Admitting: Family Medicine

## 2019-04-08 NOTE — Telephone Encounter (Signed)
Please see lab results - it appears D dimer has not been resulted - please call [facility] today and check results and let me know. Message also routed to lab add on pool.  Thanks.

## 2019-04-08 NOTE — Telephone Encounter (Signed)
Msg has been sent to patient by mychart that he need to return to the office to have the d dimer redrawn.

## 2019-04-10 LAB — D-DIMER, QUANTITATIVE

## 2019-05-06 ENCOUNTER — Other Ambulatory Visit: Payer: Self-pay | Admitting: Family Medicine

## 2019-05-06 DIAGNOSIS — E785 Hyperlipidemia, unspecified: Secondary | ICD-10-CM

## 2019-05-20 NOTE — Telephone Encounter (Signed)
Pt returned to office and d dimer drawn

## 2019-05-23 ENCOUNTER — Ambulatory Visit: Payer: BC Managed Care – PPO | Attending: Internal Medicine

## 2019-05-23 DIAGNOSIS — Z23 Encounter for immunization: Secondary | ICD-10-CM

## 2019-05-23 NOTE — Progress Notes (Signed)
   Covid-19 Vaccination Clinic  Name:  Curtis Yang    MRN: [id number] DOB: 1960-07-19  05/23/2019  [name] was observed post Covid-19 immunization for 15 minutes without incident. He was provided with Vaccine Information Sheet and instruction to access the V-Safe system.   [name] was instructed to call 911 with any severe reactions post vaccine: [name] Difficulty breathing  . Swelling of face and throat  . A fast heartbeat  . A bad rash all over body  . Dizziness and weakness   Immunizations Administered    Name Date Dose VIS Date Route   Pfizer COVID-19 Vaccine 05/23/2019  8:20 AM 0.3 mL 02/07/2019 Intramuscular   Manufacturer: [place]   Lot: [id number]   [place]: [id number]

## 2019-05-28 ENCOUNTER — Ambulatory Visit: Payer: BLUE CROSS/BLUE SHIELD | Admitting: Family Medicine

## 2019-06-02 ENCOUNTER — Other Ambulatory Visit: Payer: Self-pay | Admitting: Family Medicine

## 2019-06-02 ENCOUNTER — Telehealth: Payer: Self-pay

## 2019-06-02 DIAGNOSIS — E291 Testicular hypofunction: Secondary | ICD-10-CM

## 2019-06-02 NOTE — Telephone Encounter (Signed)
Patient is requesting a refill of the following medications: Requested Prescriptions    No prescriptions requested or ordered in this encounter  Testosterone 200 mg/mL  Date of patient request: 06/02/2019 Last office visit: 12/27/2018 Date of last refill: 03/26/2019 Last refill amount: unsure Follow up time period per chart: 5 months apx 05/27/2019    Has F/u scheduled 06/17/2019

## 2019-06-03 MED ORDER — TESTOSTERONE CYPIONATE 200 MG/ML IM SOLN: INTRAMUSCULAR | 1 refills | Status: DC

## 2019-06-03 NOTE — Telephone Encounter (Signed)
Controlled substance database (PDMP) reviewed. No concerns appreciated.  Last filled March 20.  Refill ordered but needs office visit.  Appears he was scheduled March 30 but that needs to be rescheduled. Thanks.

## 2019-06-03 NOTE — Telephone Encounter (Signed)
Patient informed and will call back to schedule his med review appointment

## 2019-06-03 NOTE — Addendum Note (Signed)
Addended by: Merri Ray R on: 06/03/2019 12:58 PM   Modules accepted: Orders

## 2019-06-17 ENCOUNTER — Ambulatory Visit: Payer: BC Managed Care – PPO | Attending: Internal Medicine

## 2019-06-17 DIAGNOSIS — Z23 Encounter for immunization: Secondary | ICD-10-CM

## 2019-06-17 NOTE — Progress Notes (Signed)
   Covid-19 Vaccination Clinic  Name:  JUNAH FRAGOZA    MRN: [id number] DOB: Oct 17, 1960  06/17/2019  Mr. Hopp was observed post Covid-19 immunization for 15 minutes without incident. He was provided with Vaccine Information Sheet and instruction to access the V-Safe system.   Mr. Alfieri was instructed to call 911 with any severe reactions post vaccine: [name] Difficulty breathing  . Swelling of face and throat  . A fast heartbeat  . A bad rash all over body  . Dizziness and weakness   Immunizations Administered    Name Date Dose VIS Date Route   Pfizer COVID-19 Vaccine 06/17/2019  8:03 AM 0.3 mL 04/23/2018 Intramuscular   Manufacturer: [place]   Lot: [id number]   Anthony: [id number]

## 2019-06-23 ENCOUNTER — Other Ambulatory Visit: Payer: Self-pay | Admitting: Family Medicine

## 2019-06-23 DIAGNOSIS — F419 Anxiety disorder, unspecified: Secondary | ICD-10-CM

## 2019-06-23 DIAGNOSIS — I10 Essential (primary) hypertension: Secondary | ICD-10-CM

## 2019-07-11 ENCOUNTER — Other Ambulatory Visit: Payer: Self-pay | Admitting: Family Medicine

## 2019-07-11 DIAGNOSIS — F419 Anxiety disorder, unspecified: Secondary | ICD-10-CM

## 2019-07-11 NOTE — Telephone Encounter (Signed)
Called pt put him on Dr.Greens schedule for 07/25/2019

## 2019-07-11 NOTE — Telephone Encounter (Signed)
Patient is requesting a refill of the following medications: Requested Prescriptions   Pending Prescriptions Disp Refills   traZODone (DESYREL) 50 MG tablet [Pharmacy Med Name: TRAZODONE 50MG  TABLETS] 90 tablet 1    Sig: TAKE 1 TABLET(50 MG) BY MOUTH AT BEDTIME AS NEEDED FOR SLEEP    Date of patient request: 07/11/2019 Last office visit: 03/26/2019 Date of last refill: 03/21/2019 Last refill amount: 90 Follow up time period per chart: none

## 2019-07-11 NOTE — Telephone Encounter (Signed)
Trazodone refilled, but needs ov - please schedule follow up.

## 2019-07-25 ENCOUNTER — Ambulatory Visit: Payer: BC Managed Care – PPO | Admitting: Family Medicine

## 2019-08-18 ENCOUNTER — Other Ambulatory Visit: Payer: Self-pay | Admitting: Family Medicine

## 2019-08-18 DIAGNOSIS — E785 Hyperlipidemia, unspecified: Secondary | ICD-10-CM

## 2019-08-26 DIAGNOSIS — M545 Low back pain: Secondary | ICD-10-CM | POA: Diagnosis not present

## 2019-08-26 DIAGNOSIS — M542 Cervicalgia: Secondary | ICD-10-CM | POA: Diagnosis not present

## 2019-09-25 ENCOUNTER — Other Ambulatory Visit: Payer: Self-pay | Admitting: Family Medicine

## 2019-09-25 DIAGNOSIS — I10 Essential (primary) hypertension: Secondary | ICD-10-CM

## 2019-09-25 NOTE — Telephone Encounter (Signed)
Called Pt LVMTCB On 09/24/19 @ 1:56

## 2019-09-25 NOTE — Telephone Encounter (Signed)
No further refills without office visit 

## 2019-09-25 NOTE — Telephone Encounter (Signed)
Please schedule appt for f/u and med refills

## 2019-09-25 NOTE — Telephone Encounter (Signed)
Requested  medications are  due for refill today yes  Requested medications are on the active medication list yes  Last refill 6/14  Last visit 12/27/2018  Future visit scheduled No  Notes to clinic Failed protocol of visit within 6 months

## 2019-10-06 ENCOUNTER — Encounter: Payer: Self-pay | Admitting: Family Medicine

## 2019-10-08 ENCOUNTER — Other Ambulatory Visit: Payer: Self-pay | Admitting: Family Medicine

## 2019-10-08 DIAGNOSIS — I10 Essential (primary) hypertension: Secondary | ICD-10-CM

## 2019-10-08 NOTE — Telephone Encounter (Signed)
Requested Prescriptions  Pending Prescriptions Disp Refills  . metoprolol succinate (TOPROL-XL) 100 MG 24 hr tablet [Pharmacy Med Name: METOPROLOL ER SUCCINATE 100MG  TABS] 36 tablet 0    Sig: TAKE 1 TABLET BY MOUTH TWICE DAILY WITH OR IMMEDIATELY FOLLOWING A MEAL     Cardiovascular:  Beta Blockers Failed - 10/08/2019  3:46 AM      Failed - Valid encounter within last 6 months    Recent Outpatient Visits          6 months ago Cough   [facility], Ranell Patrick, MD   9 months ago Annual physical exam   [facility], Ranell Patrick, MD   10 months ago Need for influenza vaccination   [facility], Ranell Patrick, MD   11 months ago Hypogonadism in male   [facility], Ranell Patrick, MD   2 years ago Annual physical exam   [facility] [place] [facility], PA-C      Future Appointments            In 2 weeks Wendie Agreste, MD [facility] [place] [place] BP in normal range    BP Readings from Last 1 Encounters:  12/27/18 121/80         Passed - Last Heart Rate in normal range    Pulse Readings from Last 1 Encounters:  12/27/18 61         Courtesy Refill of 36 tablets to get patient to his scheduled appt on 10/24/2019.  Notation made that patient must keep appt. For further refills.

## 2019-10-24 ENCOUNTER — Other Ambulatory Visit: Payer: Self-pay

## 2019-10-24 ENCOUNTER — Encounter: Payer: Self-pay | Admitting: Family Medicine

## 2019-10-24 ENCOUNTER — Ambulatory Visit: Payer: BC Managed Care – PPO | Admitting: Family Medicine

## 2019-10-24 VITALS — BP 122/82 | HR 57 | Temp 98.1°F | Ht 72.0 in | Wt 206.0 lb

## 2019-10-24 DIAGNOSIS — E785 Hyperlipidemia, unspecified: Secondary | ICD-10-CM

## 2019-10-24 DIAGNOSIS — E291 Testicular hypofunction: Secondary | ICD-10-CM | POA: Diagnosis not present

## 2019-10-24 DIAGNOSIS — Z5181 Encounter for therapeutic drug level monitoring: Secondary | ICD-10-CM

## 2019-10-24 DIAGNOSIS — F419 Anxiety disorder, unspecified: Secondary | ICD-10-CM

## 2019-10-24 DIAGNOSIS — I10 Essential (primary) hypertension: Secondary | ICD-10-CM

## 2019-10-24 DIAGNOSIS — Z23 Encounter for immunization: Secondary | ICD-10-CM

## 2019-10-24 DIAGNOSIS — M545 Low back pain, unspecified: Secondary | ICD-10-CM

## 2019-10-24 MED ORDER — TRAZODONE HCL 50 MG PO TABS: ORAL_TABLET | ORAL | 1 refills | Status: DC

## 2019-10-24 MED ORDER — METOPROLOL SUCCINATE ER 100 MG PO TB24: 100.0000 mg | ORAL_TABLET | Freq: Every day | ORAL | 2 refills | Status: DC

## 2019-10-24 MED ORDER — TESTOSTERONE CYPIONATE 200 MG/ML IM SOLN: INTRAMUSCULAR | 5 refills | Status: DC

## 2019-10-24 MED ORDER — PAROXETINE HCL 40 MG PO TABS: 40.0000 mg | ORAL_TABLET | Freq: Every day | ORAL | 3 refills | Status: DC

## 2019-10-24 MED ORDER — CYCLOBENZAPRINE HCL 5 MG PO TABS: 5.0000 mg | ORAL_TABLET | Freq: Three times a day (TID) | ORAL | 1 refills | Status: DC | PRN

## 2019-10-24 MED ORDER — ROSUVASTATIN CALCIUM 5 MG PO TABS: ORAL_TABLET | ORAL | 1 refills | Status: DC

## 2019-10-24 NOTE — Progress Notes (Signed)
Subjective:  Patient ID: Curtis Yang, male    DOB: 1960/04/04  Age: 59 y.o. MRN: [id number]  CC:  Chief Complaint  Patient presents with  . Testosterone check    Pt reports feeling a little fatiuged. pt reports no decress in sex drive. pt also would like a refill on his testosterone medication  . Medication Refill    Pt would like a refill on Toprol,Paxil,Crestor, Testosterone, and Trazodone. pt reports these medications work well for him with no known side effects. pt reports the Toprol-XL  he takes one tab po daily, but notices that the med list states to take one tab po 2x daily and would like to know if he is taking it wrong or not. pt stats meication seems to be working well reguardless.  . Back Pain    Pt reports lower back pain and states the provider has treated it before and given the pt a musscel relaxer in the past and was wondering if the provider could do that again becuase his pain is flairing up. pt was informed he may need to make a seprate appt for this.    HPI Curtis Yang presents for  Multiple concerns as above with med refills.  Episodic back pain: Low back pain, no known injury recently.  Has had flares in the past with right-sided sciatica, treated with Flexeril.  Prior lumbar stenosis, previous disc herniation. Occasional flair - more past 3 weeks. Saw NS 1 month ago - Dr. Arnoldo Morale. Had prednisone. About the same. Follow up planned for possible MRI.  Would like to try flexeril - tolerated prior. No recent use.  No bowel or bladder incontinence, no saddle anesthesia, no lower extremity weakness.  No recent injury/fall.   Depression: paxil working well. Trazodone working well for sleep.   Depression screen [facility] 2/9 10/24/2019 12/27/2018 11/11/2018 10/08/2017 08/13/2017  Decreased Interest 0 0 0 0 0  Down, Depressed, Hopeless 0 0 0 0 0  PHQ - 2 Score 0 0 0 0 0     Hyperlipidemia: Crestor 5 mg daily, no new myalgias/side effects.  Lab Results    Component Value Date   CHOL 163 11/18/2018   HDL 48 11/18/2018   LDLCALC 84 11/18/2018   TRIG 182 (H) 11/18/2018   CHOLHDL 3.4 11/18/2018   Lab Results  Component Value Date   ALT 15 11/18/2018   AST 19 11/18/2018   ALKPHOS 85 11/18/2018   BILITOT 0.5 11/18/2018   Hypogonadism Last in person visit in October 2020.  Testosterone injection 200 mg every 3 weeks at that time. Currently taking 200mg  inj Q3 weeks.  Level of 318 in 10/2018. Last injection 2 weeks ago.  Has had some decreased energy/fatigue - past few weeks, no recent fever,  libido has been stable. Has been working in hot conditions.   Hypertension: Toprol 100 mg -has been taking 1/day. Same dose for awhile.  Home readings: normal at pharmacy.  BP Readings from Last 3 Encounters:  10/24/19 122/82  12/27/18 121/80  12/26/18 105/65   Lab Results  Component Value Date   CREATININE 1.07 11/18/2018       History Patient Active Problem List   Diagnosis Date Noted  . Essential hypertension 03/27/2016  . Lumbar stenosis 09/16/2015  . Gastroesophageal reflux disease without esophagitis 02/25/2014  . 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 Depression 03/15/2011  . Insomnia 03/15/2011  . Anxiety 03/15/2011  . Increased serum lipids 03/15/2011  . Low back pain 03/15/2011  .  Erectile dysfunction 03/15/2011  . Increased glucose level 03/15/2011  . Testosterone deficiency 03/15/2011  . History of colon polyps 03/15/2011   Past Medical History:  Diagnosis Date  . Allergy   . Anxiety   . Arthritis    DDD lumbar.  . Benign skin lesion of nose   . Depression   . Erectile dysfunction    Due to decreased testosterone   . GERD (gastroesophageal reflux disease)    NO TROUBLE IN YEARS  . History of colon polyps   . Hyperlipidemia   . Hypertension   . Hypogonadism male   . Increased glucose level   . Insomnia   . Low back pain    Past Surgical History:  Procedure Laterality Date  . BREAST  MASS EXCISION     RIGHT CHEST  BENIGN  . COLONOSCOPY W/ POLYPECTOMY  09/2013   One polyp, came back pre-cancerous. Repeat 5 years unless symptoms present.   [name] EYE SURGERY  02/28/2000   Lasik  . MASS EXCISION     RIGHT EAR   BENIGN  . SPINE SURGERY  09/26/2012   [facility] [place].  . TRANSFORAMINAL LUMBAR INTERBODY FUSION (TLIF) WITH PEDICLE SCREW FIXATION 1 LEVEL Right 04/22/2013   Procedure: TRANSFORAMINAL LUMBAR INTERBODY FUSION (TLIF) WITH PEDICLE SCREW FIXATION LUMBAR FIVE SACRAL ONE;  Surgeon: Faythe Ghee, MD;  Location: [facility];  Service: Neurosurgery;  Laterality: Right;   No Known Allergies Prior to Admission medications   Medication Sig Start Date End Date Taking? Authorizing Provider  benzonatate (TESSALON) 100 MG capsule Take 1 capsule (100 mg total) by mouth 3 (three) times daily as needed for cough. 03/26/19  Yes Wendie Agreste, MD  hydrOXYzine (ATARAX/VISTARIL) 25 MG tablet TAKE 1 TABLET(25 MG) BY MOUTH EVERY 8 HOURS AS NEEDED FOR ANXIETY 06/23/19  Yes Wendie Agreste, MD  metoprolol succinate (TOPROL-XL) 100 MG 24 hr tablet TAKE 1 TABLET BY MOUTH TWICE DAILY WITH OR IMMEDIATELY FOLLOWING A MEAL 10/08/19  Yes Wendie Agreste, MD  PARoxetine (PAXIL) 40 MG tablet Take 1 tablet (40 mg total) by mouth daily. 11/11/18  Yes Wendie Agreste, MD  rosuvastatin (CRESTOR) 5 MG tablet TAKE 1 TABLET(5 MG) BY MOUTH DAILY 08/18/19  Yes Wendie Agreste, MD  SYRINGE-NEEDLE, DISP, 3 ML ([employer] SAFETY SYRINGE) 22G X 1-1/2" 3 ML MISC USE AS DIRECTED TO SELF-ADMINISTER TESTOSTERONE 11/11/18  Yes Wendie Agreste, MD  testosterone cypionate (DEPOTESTOSTERONE CYPIONATE) 200 MG/ML injection INJECT UNDER THE SKIN EVERY 3 WEEKS AS DIRECTED 06/03/19  Yes Wendie Agreste, MD  traZODone (DESYREL) 50 MG tablet TAKE 1 TABLET(50 MG) BY MOUTH AT BEDTIME AS NEEDED FOR SLEEP 07/11/19  Yes Wendie Agreste, MD   Social History   Socioeconomic History  . Marital status:  Married    Spouse name: Not on file  . Number of children: Not on file  . Years of education: Not on file  . Highest education level: Not on file  Occupational History  . Not on file  Tobacco Use  . Smoking status: Never Smoker  . Smokeless tobacco: Never Used  Vaping Use  . Vaping Use: Never used  Substance and Sexual Activity  . Alcohol use: No  . Drug use: No  . Sexual activity: Yes    Comment: number of sex partners in the last 12 months  1  Other Topics Concern  . Not on file  Social History Narrative   Marital status:  Married  x 19 years; happily married; second marriage; first wife died AMI age 19.        Children:  1 child; 3 stepchildren.  Several grandchildren.      Lives: with wife.      Employment:  [employer]; Print production planner x 19 years; happy.  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        Tobacco: never       Alcohol:  None      Drugs: none      Exercise:  Walking twice daily 2-3 miles total each day.  Job physically demanding.        Seatbelt: 100%; no texting      Guns: yes; unloaded; in safe.      Sunscreen: SPF on face.   Social Determinants of Health   Financial Resource Strain:   . Difficulty of Paying Living Expenses: Not on file  Food Insecurity:   . Worried About Charity fundraiser in the Last Year: Not on file  . Ran Out of Food in the Last Year: Not on file  Transportation Needs:   . Lack of Transportation (Medical): Not on file  . Lack of Transportation (Non-Medical): Not on file  Physical Activity:   . Days of Exercise per Week: Not on file  . Minutes of Exercise per Session: Not on file  Stress:   . Feeling of Stress : Not on file  Social Connections:   . Frequency of Communication with Friends and Family: Not on file  . Frequency of Social Gatherings with Friends and Family: Not on file  . Attends Religious Services: Not on file  . Active Member of Clubs or Organizations: Not on file  . Attends Archivist Meetings: Not  on file  . Marital Status: Not on file  Intimate Partner Violence:   . Fear of Current or Ex-Partner: Not on file  . Emotionally Abused: Not on file  . Physically Abused: Not on file  . Sexually Abused: Not on file    Review of Systems  Constitutional: Negative for fatigue and unexpected weight change.  Eyes: Negative for visual disturbance.  Respiratory: Negative for cough, chest tightness and shortness of breath.   Cardiovascular: Negative for chest pain, palpitations and leg swelling.  Gastrointestinal: Negative for abdominal pain and blood in stool.  Neurological: Negative for dizziness, light-headedness and headaches.     Objective:   Vitals:   10/24/19 1339  BP: 122/82  Pulse: (!) 57  Temp: 98.1 F (36.7 C)  TempSrc: Temporal  SpO2: 95%  Weight: 206 lb (93.4 kg)  Height: 6' (1.829 m)     Physical Exam Vitals reviewed.  Constitutional:      Appearance: He is well-developed.  HENT:     Head: Normocephalic and atraumatic.  Eyes:     Pupils: Pupils are equal, round, and reactive to light.  Neck:     Vascular: No carotid bruit or JVD.  Cardiovascular:     Rate and Rhythm: Normal rate and regular rhythm.     Heart sounds: Normal heart sounds. No murmur heard.   Pulmonary:     Effort: Pulmonary effort is normal.     Breath sounds: Normal breath sounds. No rales.  Skin:    General: Skin is warm and dry.  Neurological:     Mental Status: He is alert and oriented to person, place, and time.        Assessment & Plan:  [name]  is a 59 y.o. male . Hypogonadism in male - Plan: Testosterone, Free, Total, SHBG, testosterone cypionate (DEPOTESTOSTERONE CYPIONATE) 200 MG/ML injection  -Check testosterone levels as well as other monitoring labs.  Continue same dose for now at 200 mg every 3 weeks.  Essential hypertension - Plan: Lipid panel, Comprehensive metabolic panel, metoprolol succinate (TOPROL-XL) 100 MG 24 hr tablet Essential hypertension - See  HPI.  Ambulatory measures well controlled.  - Plan: Lipid panel, Comprehensive metabolic panel, metoprolol succinate (TOPROL-XL) 100 MG 24 hr tablet  - Stable, tolerating current regimen. Medications refilled. Labs pending as above.   Need for prophylactic vaccination and inoculation against influenza - Plan: Flu Vaccine QUAD 36+ mos IM  Anxiety -  - Plan: PARoxetine (PAXIL) 40 MG tablet, traZODone (DESYREL) 50 MG tablet  -Stable on current regimen, continue same  Hyperlipidemia, unspecified hyperlipidemia type - Plan: rosuvastatin (CRESTOR) 5 MG tablet ` -Tolerating Crestor, continue same dose  Episodic low back pain, followed by neurosurgery, recent flare.  Has follow-up planned with neurosurgery if not improving, agreed on temporary refill of cyclobenzaprine with potential side effects discussed.  No orders of the defined types were placed in this encounter.  Patient Instructions    Flexeril if needed short term - follow up with Dr. Arnoldo Morale for back pain if persists.  Follow up in next few weeks to discuss labs for fatigue, and other possible causes.  Return to the clinic or go to the nearest emergency room if any of your symptoms worsen or new symptoms occur.       If you have lab work done today you will be contacted with your lab results within the next 2 weeks.  If you have not heard from [place] then please contact us. The fastest way to get your results is to register for My Chart.   IF you received an x-ray today, you will receive an invoice from [facility]. Please contact [facility] at [phone] with questions or concerns regarding your invoice.   IF you received labwork today, you will receive an invoice from [place]. Please contact [facility] at [phone] with questions or concerns regarding your invoice.   Our billing staff will not be able to assist you with questions regarding bills from these companies.  You will be contacted with the lab results  as soon as they are available. The fastest way to get your results is to activate your My Chart account. Instructions are located on the last page of this paperwork. If you have not heard from [place] regarding the results in 2 weeks, please contact this office.         Signed, Merri Ray, MD [facility]

## 2019-10-24 NOTE — Patient Instructions (Addendum)
  Flexeril if needed short term - follow up with Dr. Arnoldo Morale for back pain if persists.  Follow up in next few weeks to discuss labs for fatigue, and other possible causes.  Return to the clinic or go to the nearest emergency room if any of your symptoms worsen or new symptoms occur.       If you have lab work done today you will be contacted with your lab results within the next 2 weeks.  If you have not heard from [place] then please contact us. The fastest way to get your results is to register for My Chart.   IF you received an x-ray today, you will receive an invoice from [facility]. Please contact [facility] at [phone] with questions or concerns regarding your invoice.   IF you received labwork today, you will receive an invoice from [place]. Please contact [facility] at [phone] with questions or concerns regarding your invoice.   Our billing staff will not be able to assist you with questions regarding bills from these companies.  You will be contacted with the lab results as soon as they are available. The fastest way to get your results is to activate your My Chart account. Instructions are located on the last page of this paperwork. If you have not heard from [place] regarding the results in 2 weeks, please contact this office.

## 2019-10-25 LAB — COMPREHENSIVE METABOLIC PANEL
ALT: 19 IU/L (ref 0–44)
AST: 18 IU/L (ref 0–40)
Albumin/Globulin Ratio: 2 (ref 1.2–2.2)
Albumin: 4.9 g/dL (ref 3.8–4.9)
Alkaline Phosphatase: 96 IU/L (ref 48–121)
BUN/Creatinine Ratio: 11 (ref 10–24)
BUN: 13 mg/dL (ref 8–27)
Bilirubin Total: 0.6 mg/dL (ref 0.0–1.2)
CO2: 26 mmol/L (ref 20–29)
Calcium: 9.9 mg/dL (ref 8.6–10.2)
Chloride: 103 mmol/L (ref 96–106)
Creatinine, Ser: 1.17 mg/dL (ref 0.76–1.27)
GFR calc Af Amer: 78 mL/min/{1.73_m2} (ref >59)
GFR calc non Af Amer: 67 mL/min/{1.73_m2} (ref >59)
Globulin, Total: 2.4 g/dL (ref 1.5–4.5)
Glucose: 86 mg/dL (ref 65–99)
Potassium: 4.7 mmol/L (ref 3.5–5.2)
Sodium: 139 mmol/L (ref 134–144)
Total Protein: 7.3 g/dL (ref 6.0–8.5)

## 2019-10-25 LAB — LIPID PANEL
Chol/HDL Ratio: 3.5 ratio (ref 0.0–5.0)
Cholesterol, Total: 191 mg/dL (ref 100–199)
HDL: 55 mg/dL (ref >39)
LDL Chol Calc (NIH): 116 mg/dL — ABNORMAL HIGH (ref 0–99)
Triglycerides: 109 mg/dL (ref 0–149)
VLDL Cholesterol Cal: 20 mg/dL (ref 5–40)

## 2019-10-25 LAB — CBC
Hematocrit: 45.5 % (ref 37.5–51.0)
Hemoglobin: 15.4 g/dL (ref 13.0–17.7)
MCH: 31 pg (ref 26.6–33.0)
MCHC: 33.8 g/dL (ref 31.5–35.7)
MCV: 92 fL (ref 79–97)
Platelets: 237 10*3/uL (ref 150–450)
RBC: 4.96 x10E6/uL (ref 4.14–5.80)
RDW: 13.3 % (ref 11.6–15.4)
WBC: 6.5 10*3/uL (ref 3.4–10.8)

## 2019-10-25 LAB — PSA: Prostate Specific Ag, Serum: 1 ng/mL (ref 0.0–4.0)

## 2019-10-25 LAB — TESTOSTERONE, FREE, TOTAL, SHBG
Sex Hormone Binding: 65.8 nmol/L (ref 19.3–76.4)
Testosterone, Free: 4.2 pg/mL — ABNORMAL LOW (ref 6.6–18.1)
Testosterone: 339 ng/dL (ref 264–916)

## 2019-11-01 ENCOUNTER — Other Ambulatory Visit: Payer: Self-pay | Admitting: Family Medicine

## 2019-11-01 DIAGNOSIS — I10 Essential (primary) hypertension: Secondary | ICD-10-CM

## 2019-12-10 ENCOUNTER — Other Ambulatory Visit: Payer: Self-pay

## 2019-12-10 ENCOUNTER — Encounter: Payer: Self-pay | Admitting: Family Medicine

## 2019-12-10 ENCOUNTER — Ambulatory Visit: Payer: BC Managed Care – PPO | Admitting: Family Medicine

## 2019-12-10 VITALS — BP 126/81 | HR 64 | Temp 98.4°F | Ht 72.0 in | Wt 212.4 lb

## 2019-12-10 DIAGNOSIS — F419 Anxiety disorder, unspecified: Secondary | ICD-10-CM

## 2019-12-10 DIAGNOSIS — R5383 Other fatigue: Secondary | ICD-10-CM | POA: Diagnosis not present

## 2019-12-10 MED ORDER — BUSPIRONE HCL 5 MG PO TABS: 5.0000 mg | ORAL_TABLET | Freq: Two times a day (BID) | ORAL | 1 refills | Status: DC

## 2019-12-10 NOTE — Patient Instructions (Addendum)
For anxiety/stress - see info below, but meeting with counselor may also be helpful. That can be through [place] or here are local numbers.   Here are a few options for counseling:  [facility]:  [place] [phone]  Can also try adding buspirone twice per day. This may lessen the need for hydroxyzine (which may cause sedation).    If continued wakening with treatment of anxiety, I would recommend a sleep study.   Send me update on symptoms in 3-4 weeks. Office visit 2 months.  Return to the clinic or go to the nearest emergency room if any of your symptoms worsen or new symptoms occur.   Managing Stress, Adult Feeling a certain amount of stress is normal. Stress helps our body and mind get ready to deal with the demands of life. Stress hormones can motivate you to do well at work and meet your responsibilities. However severe or long-lasting (chronic) stress can affect your mental and physical health. Chronic stress puts you at higher risk for anxiety, depression, and other health problems like digestive problems, muscle aches, heart disease, high blood pressure, and stroke. What are the causes? Common causes of stress include:  Demands from work, such as deadlines, feeling overworked, or having long hours.  Pressures at home, such as money issues, disagreements with a spouse, or parenting issues.  Pressures from major life changes, such as divorce, moving, loss of a loved one, or chronic illness. You may be at higher risk for stress-related problems if you do not get enough sleep, are in poor health, do not have emotional support, or have a mental health disorder like anxiety or depression. How to recognize stress Stress can make you:  Have trouble sleeping.  Feel sad, anxious, irritable, or overwhelmed.  Lose your appetite.  Overeat or want to eat unhealthy foods.  Want to use drugs or alcohol. Stress can also cause  physical symptoms, such as:  Sore, tense muscles, especially in the shoulders and neck.  Headaches.  Trouble breathing.  A faster heart rate.  Stomach pain, nausea, or vomiting.  Diarrhea or constipation.  Trouble concentrating. Follow these instructions at home: Lifestyle  Identify the source of your stress and your reaction to it. See a therapist who can help you change your reactions.  When there are stressful events: ? Talk about it with family, friends, or co-workers. ? Try to think realistically about stressful events and not ignore them or overreact. ? Try to find the positives in a stressful situation and not focus on the negatives. ? Cut back on responsibilities at work and home, if possible. Ask for help from friends or family members if you need it.  Find ways to cope with stress, such as: ? Meditation. ? Deep breathing. ? Yoga or tai chi. ? Progressive muscle relaxation. ? Doing art, playing music, or reading. ? Making time for fun activities. ? Spending time with family and friends.  Get support from family, friends, or spiritual resources. Eating and drinking  Eat a healthy diet. This includes: ? Eating foods that are high in fiber, such as beans, whole grains, and fresh fruits and vegetables. ? Limiting foods that are high in fat and processed sugars, such as fried and sweet foods.  Do not skip meals or overeat.  Drink enough fluid to keep your urine pale yellow. Alcohol use  Do not drink alcohol if: ? Your health care provider tells you not to drink. ? You are pregnant, may  be pregnant, or are planning to become pregnant.  Drinking alcohol is a way some people try to ease their stress. This can be dangerous, so if you drink alcohol: ? Limit how much you use to:  0-1 drink a day for women.  0-2 drinks a day for men. ? Be aware of how much alcohol is in your drink. In the [place] one drink equals one 12 oz bottle of beer (355 mL), one 5 oz glass of  wine (148 mL), or one 1 oz glass of hard liquor (44 mL). Activity   Include 30 minutes of exercise in your daily schedule. Exercise is a good stress reducer.  Include time in your day for an activity that you find relaxing. Try taking a walk, going on a bike ride, reading a book, or listening to music.  Schedule your time in a way that lowers stress, and keep a consistent schedule. Prioritize what is most important to get done. General instructions  Get enough sleep. Try to go to sleep and get up at about the same time every day.  Take over-the-counter and prescription medicines only as told by your health care provider.  Do not use any products that contain nicotine or tobacco, such as cigarettes, e-cigarettes, and chewing tobacco. If you need help quitting, ask your health care provider.  Do not use drugs or smoke to cope with stress.  Keep all follow-up visits as told by your health care provider. This is important. Where to find support  Talk with your health care provider about stress management or finding a support group.  Find a therapist to work with you on your stress management techniques. Contact a health care provider if:  Your stress symptoms get worse.  You are unable to manage your stress at home.  You are struggling to stop using drugs or alcohol. Get help right away if:  You may be a danger to yourself or others.  You have any thoughts of death or suicide. If you ever feel like you may hurt yourself or others, or have thoughts about taking your own life, get help right away. You can go to your nearest emergency department or call:  Your local emergency services (911 in the [place]  A suicide crisis helpline, such as the [place] at 765-259-0735. This is open 24 hours a day. Summary  Feeling a certain amount of stress is normal, but severe or long-lasting (chronic) stress can affect your mental and physical health.  Chronic  stress can put you at higher risk for anxiety, depression, and other health problems like digestive problems, muscle aches, heart disease, high blood pressure, and stroke.  You may be at higher risk for stress-related problems if you do not get enough sleep, are in poor health, lack emotional support, or have a mental health disorder like anxiety or depression.  Identify the source of your stress and your reaction to it. Try talking about stressful events with family, friends, or co-workers, finding a coping method, or getting support from spiritual resources.  If you need more help, talk with your health care provider about finding a support group or a mental health therapist. This information is not intended to replace advice given to you by your health care provider. Make sure you discuss any questions you have with your health care provider. Document Revised: 09/11/2018 Document Reviewed: 09/11/2018 Elsevier Patient Education  [place], Adult After being diagnosed with an anxiety disorder, you  may be relieved to know why you have felt or behaved a certain way. You may also feel overwhelmed about the treatment ahead and what it will mean for your life. With care and support, you can manage this condition and recover from it. How to manage lifestyle changes Managing stress and anxiety  Stress is your body's reaction to life changes and events, both good and bad. Most stress will last just a few hours, but stress can be ongoing and can lead to more than just stress. Although stress can play a major role in anxiety, it is not the same as anxiety. Stress is usually caused by something external, such as a deadline, test, or competition. Stress normally passes after the triggering event has ended.  Anxiety is caused by something internal, such as imagining a terrible outcome or worrying that something will go wrong that will devastate you. Anxiety often does not go away  even after the triggering event is over, and it can become long-term (chronic) worry. It is important to understand the differences between stress and anxiety and to manage your stress effectively so that it does not lead to an anxious response. Talk with your health care provider or a counselor to learn more about reducing anxiety and stress. He or she may suggest tension reduction techniques, such as:  Music therapy. This can include creating or listening to music that you enjoy and that inspires you.  Mindfulness-based meditation. This involves being aware of your normal breaths while not trying to control your breathing. It can be done while sitting or walking.  Centering prayer. This involves focusing on a word, phrase, or sacred image that means something to you and brings you peace.  Deep breathing. To do this, expand your stomach and inhale slowly through your nose. Hold your breath for 3-5 seconds. Then exhale slowly, letting your stomach muscles relax.  Self-talk. This involves identifying thought patterns that lead to anxiety reactions and changing those patterns.  Muscle relaxation. This involves tensing muscles and then relaxing them. Choose a tension reduction technique that suits your lifestyle and personality. These techniques take time and practice. Set aside 5-15 minutes a day to do them. Therapists can offer counseling and training in these techniques. The training to help with anxiety may be covered by some insurance plans. Other things you can do to manage stress and anxiety include:  Keeping a stress/anxiety diary. This can help you learn what triggers your reaction and then learn ways to manage your response.  Thinking about how you react to certain situations. You may not be able to control everything, but you can control your response.  Making time for activities that help you relax and not feeling guilty about spending your time in this way.  Visual imagery and yoga  can help you stay calm and relax.  Medicines Medicines can help ease symptoms. Medicines for anxiety include:  Anti-anxiety drugs.  Antidepressants. Medicines are often used as a primary treatment for anxiety disorder. Medicines will be prescribed by a health care provider. When used together, medicines, psychotherapy, and tension reduction techniques may be the most effective treatment. Relationships Relationships can play a big part in helping you recover. Try to spend more time connecting with trusted friends and family members. Consider going to couples counseling, taking family education classes, or going to family therapy. Therapy can help you and others better understand your condition. How to recognize changes in your anxiety Everyone responds differently to treatment for anxiety. Recovery from  anxiety happens when symptoms decrease and stop interfering with your daily activities at home or work. This may mean that you will start to:  Have better concentration and focus. Worry will interfere less in your daily thinking.  Sleep better.  Be less irritable.  Have more energy.  Have improved memory. It is important to recognize when your condition is getting worse. Contact your health care provider if your symptoms interfere with home or work and you feel like your condition is not improving. Follow these instructions at home: Activity  Exercise. Most adults should do the following: ? Exercise for at least 150 minutes each week. The exercise should increase your heart rate and make you sweat (moderate-intensity exercise). ? Strengthening exercises at least twice a week.  Get the right amount and quality of sleep. Most adults need 7-9 hours of sleep each night. Lifestyle   Eat a healthy diet that includes plenty of vegetables, fruits, whole grains, low-fat dairy products, and lean protein. Do not eat a lot of foods that are high in solid fats, added sugars, or salt.  Make  choices that simplify your life.  Do not use any products that contain nicotine or tobacco, such as cigarettes, e-cigarettes, and chewing tobacco. If you need help quitting, ask your health care provider.  Avoid caffeine, alcohol, and certain over-the-counter cold medicines. These may make you feel worse. Ask your pharmacist which medicines to avoid. General instructions  Take over-the-counter and prescription medicines only as told by your health care provider.  Keep all follow-up visits as told by your health care provider. This is important. Where to find support You can get help and support from these sources:  Self-help groups.  Online and [employer].  A trusted spiritual leader.  Couples counseling.  Family education classes.  Family therapy. Where to find more information You may find that joining a support group helps you deal with your anxiety. The following sources can help you locate counselors or support groups near you:  [place]: www.mentalhealth[place]ica.net  Anxiety and Depression Association of [place] (ADAA): https://www.clark.net/  National Alliance on Mental Illness (NAMI): www.nami.org Contact a health care provider if you:  Have a hard time staying focused or finishing daily tasks.  Spend many hours a day feeling worried about everyday life.  Become exhausted by worry.  Start to have headaches, feel tense, or have nausea.  Urinate more than normal.  Have diarrhea. Get help right away if you have:  A racing heart and shortness of breath.  Thoughts of hurting yourself or others. If you ever feel like you may hurt yourself or others, or have thoughts about taking your own life, get help right away. You can go to your nearest emergency department or call:  Your local emergency services (911 in the [place]  A suicide crisis helpline, such as the [place] [facility] at 407-783-3732. This is open 24 hours a  day. Summary  Taking steps to learn and use tension reduction techniques can help calm you and help prevent triggering an anxiety reaction.  When used together, medicines, psychotherapy, and tension reduction techniques may be the most effective treatment.  Family, friends, and partners can play a big part in helping you recover from an anxiety disorder. This information is not intended to replace advice given to you by your health care provider. Make sure you discuss any questions you have with your health care provider. Document Revised: 07/16/2018 Document Reviewed: 07/16/2018 Elsevier Patient Education  2020 [employer]  Inc.    If you have lab work done today you will be contacted with your lab results within the next 2 weeks.  If you have not heard from [place] then please contact us. The fastest way to get your results is to register for My Chart.   IF you received an x-ray today, you will receive an invoice from [facility]. Please contact [facility] at [phone] with questions or concerns regarding your invoice.   IF you received labwork today, you will receive an invoice from [facility]. Please contact [facility] at [phone] with questions or concerns regarding your invoice.   Our billing staff will not be able to assist you with questions regarding bills from these companies.  You will be contacted with the lab results as soon as they are available. The fastest way to get your results is to activate your My Chart account. Instructions are located on the last page of this paperwork. If you have not heard from [place] regarding the results in 2 weeks, please contact this office.

## 2019-12-10 NOTE — Progress Notes (Signed)
Subjective:  Patient ID: Curtis Yang, male    DOB: 11/25/1960  Age: 59 y.o. MRN: [id number]  CC:  Chief Complaint  Patient presents with  . Fatigue    1 month f/u     HPI BILLYJACK TROMPETER presents for   Fatigue: Follow-up from August 27.  Discussed chronic medical history at that time, he is on testosterone for hypogonadism, he did report some decreased energy or fatigue for a few weeks at that time.  Libido had been stable.  Had been working in hot conditions.  Blood pressure was controlled, and continues to take Paxil and trazodone for depression, doxepin was helping for sleep. CBC was normal, testosterone level stable at 339, normal CMP. Still feeling about the same - feels run down past few months. More field work, some increased stress with job. Overwhelmed at times.  Hydroxyzine 3-4 times per day for anxiety - feels like not working as well for anxiety. Tried meditation, other self mgt - not working as well. paxil helps, trazodone nightly.  buspar rx in 2019- not sure he took it - no known problems.  Denies depression.  No CP/dyspena at rest or activity.  No PND.  Wakes 2-3 times per night. Thinking about work. No snoring.no hx of OSA or testing.  On disability through [place] for back issues and anxiety.  Flexeril off an on for a few weeks, no change in fatigue. Off now.   Depression screen [facility] 2/9 12/10/2019 10/24/2019 12/27/2018 11/11/2018 10/08/2017  Decreased Interest 0 0 0 0 0  Down, Depressed, Hopeless 0 0 0 0 0  PHQ - 2 Score 0 0 0 0 0   No flowsheet data found.     History Patient Active Problem List   Diagnosis Date Noted  . Essential hypertension 03/27/2016  . Lumbar stenosis 09/16/2015  . Gastroesophageal reflux disease without esophagitis 02/25/2014  . 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 Depression 03/15/2011  . Insomnia 03/15/2011  . Anxiety 03/15/2011  . Increased serum lipids 03/15/2011  . Low back pain 03/15/2011  . Erectile  dysfunction 03/15/2011  . Increased glucose level 03/15/2011  . Testosterone deficiency 03/15/2011  . History of colon polyps 03/15/2011   Past Medical History:  Diagnosis Date  . Allergy   . Anxiety   . Arthritis    DDD lumbar.  . Benign skin lesion of nose   . Depression   . Erectile dysfunction    Due to decreased testosterone   . GERD (gastroesophageal reflux disease)    NO TROUBLE IN YEARS  . History of colon polyps   . Hyperlipidemia   . Hypertension   . Hypogonadism male   . Increased glucose level   . Insomnia   . Low back pain    Past Surgical History:  Procedure Laterality Date  . BREAST MASS EXCISION     RIGHT CHEST  BENIGN  . COLONOSCOPY W/ POLYPECTOMY  09/2013   One polyp, came back pre-cancerous. Repeat 5 years unless symptoms present.   [name] EYE SURGERY  02/28/2000   Lasik  . MASS EXCISION     RIGHT EAR   BENIGN  . SPINE SURGERY  09/26/2012   [facility] [place].  . TRANSFORAMINAL LUMBAR INTERBODY FUSION (TLIF) WITH PEDICLE SCREW FIXATION 1 LEVEL Right 04/22/2013   Procedure: TRANSFORAMINAL LUMBAR INTERBODY FUSION (TLIF) WITH PEDICLE SCREW FIXATION LUMBAR FIVE SACRAL ONE;  Surgeon: Faythe Ghee, MD;  Location: [place] [facility];  Service: Neurosurgery;  Laterality: Right;   No Known Allergies Prior to Admission medications   Medication Sig Start Date End Date Taking? Authorizing Provider  hydrOXYzine (ATARAX/VISTARIL) 25 MG tablet TAKE 1 TABLET(25 MG) BY MOUTH EVERY 8 HOURS AS NEEDED FOR ANXIETY 06/23/19  Yes Wendie Agreste, MD  metoprolol succinate (TOPROL-XL) 100 MG 24 hr tablet Take 1 tablet (100 mg total) by mouth daily. Take with or immediately following a meal. 10/24/19  Yes Wendie Agreste, MD  PARoxetine (PAXIL) 40 MG tablet Take 1 tablet (40 mg total) by mouth daily. 10/24/19  Yes Wendie Agreste, MD  rosuvastatin (CRESTOR) 5 MG tablet TAKE 1 TABLET(5 MG) BY MOUTH DAILY 10/24/19  Yes Wendie Agreste, MD  SYRINGE-NEEDLE, DISP, 3 ML  (VANISHPOINT SAFETY SYRINGE) 22G X 1-1/2" 3 ML MISC USE AS DIRECTED TO SELF-ADMINISTER TESTOSTERONE 11/11/18  Yes Wendie Agreste, MD  testosterone cypionate (DEPOTESTOSTERONE CYPIONATE) 200 MG/ML injection INJECT UNDER THE SKIN EVERY 3 WEEKS AS DIRECTED 10/24/19  Yes Wendie Agreste, MD  traZODone (DESYREL) 50 MG tablet TAKE 1 TABLET(50 MG) BY MOUTH AT BEDTIME AS NEEDED FOR SLEEP 10/24/19  Yes Wendie Agreste, MD   Social History   Socioeconomic History  . Marital status: Married    Spouse name: Not on file  . Number of children: Not on file  . Years of education: Not on file  . Highest education level: Not on file  Occupational History  . Not on file  Tobacco Use  . Smoking status: Never Smoker  . Smokeless tobacco: Never Used  Vaping Use  . Vaping Use: Never used  Substance and Sexual Activity  . Alcohol use: No  . Drug use: No  . Sexual activity: Yes    Comment: number of sex partners in the last 12 months  1  Other Topics Concern  . Not on file  Social History Narrative   Marital status:  Married x 10 years; happily married; second marriage; first wife died AMI age 36.        Children:  1 child; 3 stepchildren.  Several grandchildren.      Lives: with wife.      Employment:  [employer]; Print production planner x 19 years; happy.  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        Tobacco: never       Alcohol:  None      Drugs: none      Exercise:  Walking twice daily 2-3 miles total each day.  Job physically demanding.        Seatbelt: 100%; no texting      Guns: yes; unloaded; in safe.      Sunscreen: SPF on face.   Social Determinants of Health   Financial Resource Strain:   . Difficulty of Paying Living Expenses: Not on file  Food Insecurity:   . Worried About Charity fundraiser in the Last Year: Not on file  . Ran Out of Food in the Last Year: Not on file  Transportation Needs:   . Lack of Transportation (Medical): Not on file  . Lack of Transportation  (Non-Medical): Not on file  Physical Activity:   . Days of Exercise per Week: Not on file  . Minutes of Exercise per Session: Not on file  Stress:   . Feeling of Stress : Not on file  Social Connections:   . Frequency of Communication with Friends and Family: Not on file  . Frequency of Social Gatherings  with Friends and Family: Not on file  . Attends Religious Services: Not on file  . Active Member of Clubs or Organizations: Not on file  . Attends Archivist Meetings: Not on file  . Marital Status: Not on file  Intimate Partner Violence:   . Fear of Current or Ex-Partner: Not on file  . Emotionally Abused: Not on file  . Physically Abused: Not on file  . Sexually Abused: Not on file    Review of Systems   Objective:   Vitals:   12/10/19 0945  BP: 126/81  Pulse: 64  Temp: 98.4 F (36.9 C)  TempSrc: Temporal  SpO2: 96%  Weight: 212 lb 6.4 oz (96.3 kg)  Height: 6' (1.829 m)     Physical Exam Vitals reviewed.  Constitutional:      Appearance: He is well-developed.  HENT:     Head: Normocephalic and atraumatic.  Eyes:     Pupils: Pupils are equal, round, and reactive to light.  Neck:     Vascular: No carotid bruit or JVD.  Cardiovascular:     Rate and Rhythm: Normal rate and regular rhythm.     Heart sounds: Normal heart sounds. No murmur heard.   Pulmonary:     Effort: Pulmonary effort is normal.     Breath sounds: Normal breath sounds. No rales.  Skin:    General: Skin is warm and dry.  Neurological:     General: No focal deficit present.     Mental Status: He is alert and oriented to person, place, and time.  Psychiatric:        Attention and Perception: Attention normal.        Mood and Affect: Affect is flat.        Speech: Speech normal.        Behavior: Behavior normal.      32 minutes spent during visit, greater than 50% counseling and assimilation of information, chart review, and discussion of plan.    Assessment & Plan:  WILFRED DAYRIT is a 59 y.o. male . Fatigue, unspecified type - Plan: TSH Anxiety - Plan: busPIRone (BUSPAR) 5 MG tablet  -Previous labs reassuring, suspect anxiety, situational stress may be contributor.  Is also on multiple medications that can cause sedation.    -Minimal relief with hydroxyzine, will add BuSpar then taper hydroxyzine to as needed only.  Risk of serotonin syndrome and potential symptoms discussed with RTC precautions.    -Continue Paxil, trazodone same dose for now.    -Phone numbers provided for counseling.  -  Handout given on stress and anxiety management 4  -update by MyChart in 6 months, in person eval in 2 months.  If persistent nighttime wakening after treatment of anxiety/stress, would recommend sleep study.  Meds ordered this encounter  Medications  . busPIRone (BUSPAR) 5 MG tablet    Sig: Take 1 tablet (5 mg total) by mouth 2 (two) times daily.    Dispense:  60 tablet    Refill:  1   Patient Instructions   For anxiety/stress - see info below, but meeting with counselor may also be helpful. That can be through [place] or here are local numbers.   Here are a few options for counseling:  [facility]:  [place] [phone]  Can also try adding buspirone twice per day. This may lessen the need for hydroxyzine (which may cause sedation).    If continued wakening with treatment of anxiety, I  would recommend a sleep study.   Send me update on symptoms in 3-4 weeks. Office visit 2 months.  Return to the clinic or go to the nearest emergency room if any of your symptoms worsen or new symptoms occur.   Managing Stress, Adult Feeling a certain amount of stress is normal. Stress helps our body and mind get ready to deal with the demands of life. Stress hormones can motivate you to do well at work and meet your responsibilities. However severe or long-lasting (chronic) stress can affect your mental and physical  health. Chronic stress puts you at higher risk for anxiety, depression, and other health problems like digestive problems, muscle aches, heart disease, high blood pressure, and stroke. What are the causes? Common causes of stress include:  Demands from work, such as deadlines, feeling overworked, or having long hours.  Pressures at home, such as money issues, disagreements with a spouse, or parenting issues.  Pressures from major life changes, such as divorce, moving, loss of a loved one, or chronic illness. You may be at higher risk for stress-related problems if you do not get enough sleep, are in poor health, do not have emotional support, or have a mental health disorder like anxiety or depression. How to recognize stress Stress can make you:  Have trouble sleeping.  Feel sad, anxious, irritable, or overwhelmed.  Lose your appetite.  Overeat or want to eat unhealthy foods.  Want to use drugs or alcohol. Stress can also cause physical symptoms, such as:  Sore, tense muscles, especially in the shoulders and neck.  Headaches.  Trouble breathing.  A faster heart rate.  Stomach pain, nausea, or vomiting.  Diarrhea or constipation.  Trouble concentrating. Follow these instructions at home: Lifestyle  Identify the source of your stress and your reaction to it. See a therapist who can help you change your reactions.  When there are stressful events: ? Talk about it with family, friends, or co-workers. ? Try to think realistically about stressful events and not ignore them or overreact. ? Try to find the positives in a stressful situation and not focus on the negatives. ? Cut back on responsibilities at work and home, if possible. Ask for help from friends or family members if you need it.  Find ways to cope with stress, such as: ? Meditation. ? Deep breathing. ? Yoga or tai chi. ? Progressive muscle relaxation. ? Doing art, playing music, or reading. ? Making time  for fun activities. ? Spending time with family and friends.  Get support from family, friends, or spiritual resources. Eating and drinking  Eat a healthy diet. This includes: ? Eating foods that are high in fiber, such as beans, whole grains, and fresh fruits and vegetables. ? Limiting foods that are high in fat and processed sugars, such as fried and sweet foods.  Do not skip meals or overeat.  Drink enough fluid to keep your urine pale yellow. Alcohol use  Do not drink alcohol if: ? Your health care provider tells you not to drink. ? You are pregnant, may be pregnant, or are planning to become pregnant.  Drinking alcohol is a way some people try to ease their stress. This can be dangerous, so if you drink alcohol: ? Limit how much you use to:  0-1 drink a day for women.  0-2 drinks a day for men. ? Be aware of how much alcohol is in your drink. In the [place] one drink equals one 12 oz bottle of beer (  355 mL), one 5 oz glass of wine (148 mL), or one 1 oz glass of hard liquor (44 mL). Activity   Include 30 minutes of exercise in your daily schedule. Exercise is a good stress reducer.  Include time in your day for an activity that you find relaxing. Try taking a walk, going on a bike ride, reading a book, or listening to music.  Schedule your time in a way that lowers stress, and keep a consistent schedule. Prioritize what is most important to get done. General instructions  Get enough sleep. Try to go to sleep and get up at about the same time every day.  Take over-the-counter and prescription medicines only as told by your health care provider.  Do not use any products that contain nicotine or tobacco, such as cigarettes, e-cigarettes, and chewing tobacco. If you need help quitting, ask your health care provider.  Do not use drugs or smoke to cope with stress.  Keep all follow-up visits as told by your health care provider. This is important. Where to find  support  Talk with your health care provider about stress management or finding a support group.  Find a therapist to work with you on your stress management techniques. Contact a health care provider if:  Your stress symptoms get worse.  You are unable to manage your stress at home.  You are struggling to stop using drugs or alcohol. Get help right away if:  You may be a danger to yourself or others.  You have any thoughts of death or suicide. If you ever feel like you may hurt yourself or others, or have thoughts about taking your own life, get help right away. You can go to your nearest emergency department or call:  Your local emergency services (911 in the [place]  A suicide crisis helpline, such as the [place] at 269 300 6511. This is open 24 hours a day. Summary  Feeling a certain amount of stress is normal, but severe or long-lasting (chronic) stress can affect your mental and physical health.  Chronic stress can put you at higher risk for anxiety, depression, and other health problems like digestive problems, muscle aches, heart disease, high blood pressure, and stroke.  You may be at higher risk for stress-related problems if you do not get enough sleep, are in poor health, lack emotional support, or have a mental health disorder like anxiety or depression.  Identify the source of your stress and your reaction to it. Try talking about stressful events with family, friends, or co-workers, finding a coping method, or getting support from spiritual resources.  If you need more help, talk with your health care provider about finding a support group or a mental health therapist. This information is not intended to replace advice given to you by your health care provider. Make sure you discuss any questions you have with your health care provider. Document Revised: 09/11/2018 Document Reviewed: 09/11/2018 Elsevier Patient Education  [place], Adult After being diagnosed with an anxiety disorder, you may be relieved to know why you have felt or behaved a certain way. You may also feel overwhelmed about the treatment ahead and what it will mean for your life. With care and support, you can manage this condition and recover from it. How to manage lifestyle changes Managing stress and anxiety  Stress is your body's reaction to life changes and events, both good and bad. Most stress will last just a  few hours, but stress can be ongoing and can lead to more than just stress. Although stress can play a major role in anxiety, it is not the same as anxiety. Stress is usually caused by something external, such as a deadline, test, or competition. Stress normally passes after the triggering event has ended.  Anxiety is caused by something internal, such as imagining a terrible outcome or worrying that something will go wrong that will devastate you. Anxiety often does not go away even after the triggering event is over, and it can become long-term (chronic) worry. It is important to understand the differences between stress and anxiety and to manage your stress effectively so that it does not lead to an anxious response. Talk with your health care provider or a counselor to learn more about reducing anxiety and stress. He or she may suggest tension reduction techniques, such as:  Music therapy. This can include creating or listening to music that you enjoy and that inspires you.  Mindfulness-based meditation. This involves being aware of your normal breaths while not trying to control your breathing. It can be done while sitting or walking.  Centering prayer. This involves focusing on a word, phrase, or sacred image that means something to you and brings you peace.  Deep breathing. To do this, expand your stomach and inhale slowly through your nose. Hold your breath for 3-5 seconds. Then exhale slowly, letting your stomach  muscles relax.  Self-talk. This involves identifying thought patterns that lead to anxiety reactions and changing those patterns.  Muscle relaxation. This involves tensing muscles and then relaxing them. Choose a tension reduction technique that suits your lifestyle and personality. These techniques take time and practice. Set aside 5-15 minutes a day to do them. Therapists can offer counseling and training in these techniques. The training to help with anxiety may be covered by some insurance plans. Other things you can do to manage stress and anxiety include:  Keeping a stress/anxiety diary. This can help you learn what triggers your reaction and then learn ways to manage your response.  Thinking about how you react to certain situations. You may not be able to control everything, but you can control your response.  Making time for activities that help you relax and not feeling guilty about spending your time in this way.  Visual imagery and yoga can help you stay calm and relax.  Medicines Medicines can help ease symptoms. Medicines for anxiety include:  Anti-anxiety drugs.  Antidepressants. Medicines are often used as a primary treatment for anxiety disorder. Medicines will be prescribed by a health care provider. When used together, medicines, psychotherapy, and tension reduction techniques may be the most effective treatment. Relationships Relationships can play a big part in helping you recover. Try to spend more time connecting with trusted friends and family members. Consider going to couples counseling, taking family education classes, or going to family therapy. Therapy can help you and others better understand your condition. How to recognize changes in your anxiety Everyone responds differently to treatment for anxiety. Recovery from anxiety happens when symptoms decrease and stop interfering with your daily activities at home or work. This may mean that you will start  to:  Have better concentration and focus. Worry will interfere less in your daily thinking.  Sleep better.  Be less irritable.  Have more energy.  Have improved memory. It is important to recognize when your condition is getting worse. Contact your health care provider if your symptoms interfere with  home or work and you feel like your condition is not improving. Follow these instructions at home: Activity  Exercise. Most adults should do the following: ? Exercise for at least 150 minutes each week. The exercise should increase your heart rate and make you sweat (moderate-intensity exercise). ? Strengthening exercises at least twice a week.  Get the right amount and quality of sleep. Most adults need 7-9 hours of sleep each night. Lifestyle   Eat a healthy diet that includes plenty of vegetables, fruits, whole grains, low-fat dairy products, and lean protein. Do not eat a lot of foods that are high in solid fats, added sugars, or salt.  Make choices that simplify your life.  Do not use any products that contain nicotine or tobacco, such as cigarettes, e-cigarettes, and chewing tobacco. If you need help quitting, ask your health care provider.  Avoid caffeine, alcohol, and certain over-the-counter cold medicines. These may make you feel worse. Ask your pharmacist which medicines to avoid. General instructions  Take over-the-counter and prescription medicines only as told by your health care provider.  Keep all follow-up visits as told by your health care provider. This is important. Where to find support You can get help and support from these sources:  Self-help groups.  Online and [employer].  A trusted spiritual leader.  Couples counseling.  Family education classes.  Family therapy. Where to find more information You may find that joining a support group helps you deal with your anxiety. The following sources can help you locate counselors or support  groups near you:  [place]: www.mentalhealthamerica.net  Anxiety and Depression Association of [place] (ADAA): https://www.clark.net/  National Alliance on Mental Illness (NAMI): www.nami.org Contact a health care provider if you:  Have a hard time staying focused or finishing daily tasks.  Spend many hours a day feeling worried about everyday life.  Become exhausted by worry.  Start to have headaches, feel tense, or have nausea.  Urinate more than normal.  Have diarrhea. Get help right away if you have:  A racing heart and shortness of breath.  Thoughts of hurting yourself or others. If you ever feel like you may hurt yourself or others, or have thoughts about taking your own life, get help right away. You can go to your nearest emergency department or call:  Your local emergency services (911 in the [place]  A suicide crisis helpline, such as the [place] at 248-768-9260. This is open 24 hours a day. Summary  Taking steps to learn and use tension reduction techniques can help calm you and help prevent triggering an anxiety reaction.  When used together, medicines, psychotherapy, and tension reduction techniques may be the most effective treatment.  Family, friends, and partners can play a big part in helping you recover from an anxiety disorder. This information is not intended to replace advice given to you by your health care provider. Make sure you discuss any questions you have with your health care provider. Document Revised: 07/16/2018 Document Reviewed: 07/16/2018 Elsevier Patient Education  [employer].    If you have lab work done today you will be contacted with your lab results within the next 2 weeks.  If you have not heard from [place] then please contact us. The fastest way to get your results is to register for My Chart.   IF you received an x-ray today, you will receive an invoice from [facility]. Please  contact [facility] at [phone] with questions or  concerns regarding your invoice.   IF you received labwork today, you will receive an invoice from [facility]. Please contact [facility] at [phone] with questions or concerns regarding your invoice.   Our billing staff will not be able to assist you with questions regarding bills from these companies.  You will be contacted with the lab results as soon as they are available. The fastest way to get your results is to activate your My Chart account. Instructions are located on the last page of this paperwork. If you have not heard from [place] regarding the results in 2 weeks, please contact this office.         Signed, Merri Ray, MD [facility]

## 2019-12-11 LAB — TSH: TSH: 2.41 u[IU]/mL (ref 0.450–4.500)

## 2020-01-31 ENCOUNTER — Other Ambulatory Visit: Payer: Self-pay | Admitting: Family Medicine

## 2020-01-31 DIAGNOSIS — F419 Anxiety disorder, unspecified: Secondary | ICD-10-CM

## 2020-01-31 NOTE — Telephone Encounter (Signed)
Requested Prescriptions  Pending Prescriptions Disp Refills   hydrOXYzine (ATARAX/VISTARIL) 25 MG tablet [Pharmacy Med Name: HYDROXYZINE HCL 25MG  TABS (WHITE)] 90 tablet 5    Sig: TAKE 1 TABLET(25 MG) BY MOUTH EVERY 8 HOURS AS NEEDED FOR ANXIETY     Ear, Nose, and Throat:  Antihistamines Passed - 01/31/2020  7:05 AM      Passed - Valid encounter within last 12 months    Recent Outpatient Visits          1 month ago Fatigue, unspecified type   [facility], Ranell Patrick, MD   3 months ago Hypogonadism in male   [facility], Ranell Patrick, MD   10 months ago Cough   [facility], Ranell Patrick, MD   1 year ago Annual physical exam   [facility], Ranell Patrick, MD   1 year ago Need for influenza vaccination   [facility], Ranell Patrick, MD      Future Appointments            In 1 week Carlota Raspberry Ranell Patrick, MD [facility] at [place], [facility]           '

## 2020-01-31 NOTE — Telephone Encounter (Signed)
Requested Prescriptions  Pending Prescriptions Disp Refills  . busPIRone (BUSPAR) 5 MG tablet [Pharmacy Med Name: BUSPIRONE 5MG  TABLETS] 60 tablet 1    Sig: TAKE 1 TABLET(5 MG) BY MOUTH TWICE DAILY     Psychiatry: Anxiolytics/Hypnotics - Non-controlled Passed - 01/31/2020 10:16 AM      Passed - Valid encounter within last 6 months    Recent Outpatient Visits          1 month ago Fatigue, unspecified type   [facility], Ranell Patrick, MD   3 months ago Hypogonadism in male   [facility], Ranell Patrick, MD   10 months ago Cough   [facility], Ranell Patrick, MD   1 year ago Annual physical exam   [facility], Ranell Patrick, MD   1 year ago Need for influenza vaccination   [facility], Ranell Patrick, MD      Future Appointments            In 1 week Carlota Raspberry Ranell Patrick, MD [facility] at [place], [facility]

## 2020-02-09 ENCOUNTER — Ambulatory Visit: Payer: BC Managed Care – PPO | Admitting: Family Medicine

## 2020-02-14 ENCOUNTER — Other Ambulatory Visit: Payer: Self-pay | Admitting: Family Medicine

## 2020-02-14 DIAGNOSIS — E291 Testicular hypofunction: Secondary | ICD-10-CM

## 2020-02-14 NOTE — Telephone Encounter (Signed)
Requested medication (s) are due for refill today: yes  Requested medication (s) are on the active medication list: yes  Last refill:  10/24/19  Future visit scheduled: no  Notes to clinic:  med not assigned a protocol   Requested Prescriptions  Pending Prescriptions Disp Refills   testosterone cypionate (DEPOTESTOSTERONE CYPIONATE) 200 MG/ML injection [Pharmacy Med Name: TESTOSTERONE CYP 200MG /ML SDV 1ML] 1 mL     Sig: INJECT 1 ML UNDER THE SKIN EVERY 3 WEEKS AS DIRECTED      Off-Protocol Failed - 02/14/2020 10:20 AM      Failed - Medication not assigned to a protocol, review manually.      Passed - Valid encounter within last 12 months    Recent Outpatient Visits           2 months ago Fatigue, unspecified type   [facility], Ranell Patrick, MD   3 months ago Hypogonadism in male   [facility], Ranell Patrick, MD   10 months ago Cough   [facility], Ranell Patrick, MD   1 year ago Annual physical exam   [facility], Ranell Patrick, MD   1 year ago Need for influenza vaccination   [facility], Ranell Patrick, MD

## 2020-02-16 ENCOUNTER — Telehealth: Payer: Self-pay | Admitting: Emergency Medicine

## 2020-02-16 NOTE — Telephone Encounter (Signed)
Rich, I sent this rx by accident. This is a rx that we need a provider to refill. Can you look at the Testosterone rx and refill this since DR Carlota Raspberry is out for the rest of the week. Patient was last seen by Dr Carlota Raspberry on 10/24/19. It looks like I already sent this but It just printed but I already spread it.   Thank you

## 2020-02-17 ENCOUNTER — Other Ambulatory Visit: Payer: Self-pay | Admitting: Family Medicine

## 2020-02-17 DIAGNOSIS — E291 Testicular hypofunction: Secondary | ICD-10-CM

## 2020-02-17 MED ORDER — TESTOSTERONE CYPIONATE 200 MG/ML IM SOLN: INTRAMUSCULAR | 0 refills | Status: DC

## 2020-02-27 ENCOUNTER — Encounter: Payer: Self-pay | Admitting: Family Medicine

## 2020-03-12 ENCOUNTER — Other Ambulatory Visit: Payer: Self-pay | Admitting: Family Medicine

## 2020-03-12 DIAGNOSIS — E291 Testicular hypofunction: Secondary | ICD-10-CM

## 2020-03-12 NOTE — Telephone Encounter (Signed)
Requested medication (s) are due for refill today: yes  Requested medication (s) are on the active medication list: yes   Last refill:  03/04/2020  Future visit scheduled: no  Notes to clinic: this refill cannot be delegated    Requested Prescriptions  Pending Prescriptions Disp Refills   testosterone cypionate (DEPOTESTOSTERONE CYPIONATE) 200 MG/ML injection [Pharmacy Med Name: TESTOSTERONE CYP 200MG /ML SDV 1ML] 1 mL     Sig: INJECT 1ML UNDER THE SKIN EVERY 3 WEEKS      Off-Protocol Failed - 03/12/2020 11:29 AM      Failed - Medication not assigned to a protocol, review manually.      Passed - Valid encounter within last 12 months    Recent Outpatient Visits           3 months ago Fatigue, unspecified type   [facility], Ranell Patrick, MD   4 months ago Hypogonadism in male   [facility], Ranell Patrick, MD   11 months ago Cough   [facility], Ranell Patrick, MD   1 year ago Annual physical exam   [facility], Ranell Patrick, MD   1 year ago Need for influenza vaccination   [facility], Ranell Patrick, MD

## 2020-03-15 NOTE — Telephone Encounter (Signed)
Patient is requesting a refill of the following medications: Requested Prescriptions   Pending Prescriptions Disp Refills  . testosterone cypionate (DEPOTESTOSTERONE CYPIONATE) 200 MG/ML injection [Pharmacy Med Name: TESTOSTERONE CYP 200MG /ML SDV 1ML] 1 mL 3    Sig: INJECT 1ML UNDER THE SKIN EVERY 3 WEEKS    Date of patient request: 03/12/20 Last office visit: 12/10/19 Date of last refill: 02/17/20 Last refill amount: 63ml Follow up time period per chart: none scheduled

## 2020-03-15 NOTE — Telephone Encounter (Signed)
Discussed in August and up to date on labs.  Refill ordered.

## 2020-04-01 ENCOUNTER — Other Ambulatory Visit: Payer: Self-pay | Admitting: Family Medicine

## 2020-04-01 DIAGNOSIS — F419 Anxiety disorder, unspecified: Secondary | ICD-10-CM

## 2020-04-01 NOTE — Telephone Encounter (Signed)
Requested medications are due for refill today yes  Requested medications are on the active medication list yes  Last refill 1/4  Last visit Oct 2021  Future visit scheduled no  Notes to clinic Was started on Buspar with a note in [place] that said to return in 2 months, has not returned and there is not an upcoming visit scheduled. Please assess.

## 2020-04-24 ENCOUNTER — Other Ambulatory Visit: Payer: Self-pay | Admitting: Family Medicine

## 2020-04-24 DIAGNOSIS — E785 Hyperlipidemia, unspecified: Secondary | ICD-10-CM

## 2020-04-24 NOTE — Telephone Encounter (Signed)
Requested Prescriptions  Pending Prescriptions Disp Refills  . rosuvastatin (CRESTOR) 5 MG tablet [Pharmacy Med Name: ROSUVASTATIN 5MG  TABLETS] 90 tablet 1    Sig: TAKE 1 TABLET(5 MG) BY MOUTH DAILY     Cardiovascular:  Antilipid - Statins Failed - 04/24/2020  6:59 AM      Failed - LDL in normal range and within 360 days    LDL Chol Calc (NIH)  Date Value Ref Range Status  10/24/2019 116 (H) 0 - 99 mg/dL Final         Passed - Total Cholesterol in normal range and within 360 days    Cholesterol, Total  Date Value Ref Range Status  10/24/2019 191 100 - 199 mg/dL Final         Passed - HDL in normal range and within 360 days    HDL  Date Value Ref Range Status  10/24/2019 55 >39 mg/dL Final         Passed - Triglycerides in normal range and within 360 days    Triglycerides  Date Value Ref Range Status  10/24/2019 109 0 - 149 mg/dL Final         Passed - Patient is not pregnant      Passed - Valid encounter within last 12 months    Recent Outpatient Visits          4 months ago Fatigue, unspecified type   [facility] [place], MD   6 months ago Hypogonadism in male   [facility], Ranell Patrick, MD   1 year ago Cough   [facility], Ranell Patrick, MD   1 year ago Annual physical exam   [facility], Ranell Patrick, MD   1 year ago Need for influenza vaccination   [facility], Ranell Patrick, MD

## 2020-05-12 ENCOUNTER — Other Ambulatory Visit: Payer: Self-pay | Admitting: Family Medicine

## 2020-05-12 DIAGNOSIS — F419 Anxiety disorder, unspecified: Secondary | ICD-10-CM

## 2020-05-12 DIAGNOSIS — E291 Testicular hypofunction: Secondary | ICD-10-CM

## 2020-05-12 NOTE — Telephone Encounter (Signed)
Requested medication (s) are due for refill today: yes  Requested medication (s) are on the active medication list: yes  Last refill:  03/15/20 #1 ml 3 refills  Future visit scheduled: no  Notes to clinic:  medication not assigned to a protocol     Requested Prescriptions  Pending Prescriptions Disp Refills   testosterone cypionate (DEPOTESTOSTERONE CYPIONATE) 200 MG/ML injection [Pharmacy Med Name: TESTOSTERONE CYP 200MG /ML SDV 1ML] 1 mL     Sig: INJECT 1 ML UNDER THE SKIN EVERY 3 WEEK      Off-Protocol Failed - 05/12/2020  5:22 PM      Failed - Medication not assigned to a protocol, review manually.      Passed - Valid encounter within last 12 months    Recent Outpatient Visits           5 months ago Fatigue, unspecified type   [facility], Ranell Patrick, MD   6 months ago Hypogonadism in male   [facility], Ranell Patrick, MD   1 year ago Cough   [facility], Ranell Patrick, MD   1 year ago Annual physical exam   [facility], Ranell Patrick, MD   1 year ago Need for influenza vaccination   [facility], Ranell Patrick, MD                 Signed Prescriptions Disp Refills   traZODone (DESYREL) 50 MG tablet 90 tablet 0    Sig: TAKE 1 TABLET(50 MG) BY MOUTH AT BEDTIME AS NEEDED FOR SLEEP      Psychiatry: Antidepressants - Serotonin Modulator Passed - 05/12/2020  5:22 PM      Passed - Completed PHQ-2 or PHQ-9 in the last 360 days      Passed - Valid encounter within last 6 months    Recent Outpatient Visits           5 months ago Fatigue, unspecified type   [facility] [place], MD   6 months ago Hypogonadism in male   [facility], Ranell Patrick, MD   1 year ago Cough   [facility], Ranell Patrick, MD   1 year ago Annual physical exam   [facility], Ranell Patrick, MD   1 year ago Need for influenza vaccination   [facility] at  [facility], Ranell Patrick, MD

## 2020-05-12 NOTE — Telephone Encounter (Signed)
No future visit scheduled at this time . 

## 2020-05-13 ENCOUNTER — Telehealth: Payer: Self-pay | Admitting: Family Medicine

## 2020-05-13 DIAGNOSIS — E291 Testicular hypofunction: Secondary | ICD-10-CM

## 2020-05-13 NOTE — Telephone Encounter (Signed)
Dr Carlota Raspberry I refill this one by accident. If you approve can you send in this rx Thank you

## 2020-05-13 NOTE — Telephone Encounter (Signed)
Patient called and requesting refill on  testosterone cypionate (DEPOTESTOSTERONE CYPIONATE) 200 MG/ML injection [[id number]]    Sent to : [id number]  [place], [place] [address]  Phone:  [phone] Fax:  [phone]  DEA #:  [id number]

## 2020-05-17 NOTE — Telephone Encounter (Signed)
Dr Carlota Raspberry I refill this by accident and I thought I sent this msg to you 4 days agao

## 2020-05-18 MED ORDER — TESTOSTERONE CYPIONATE 200 MG/ML IM SOLN
INTRAMUSCULAR | 1 refills | Status: DC
Start: 2020-05-18 — End: 2020-08-18

## 2020-05-18 NOTE — Telephone Encounter (Signed)
Called pt discussed new location and he will call the office to set up follow up in 1 month

## 2020-05-18 NOTE — Telephone Encounter (Signed)
Refilled but due for follow up and labs for hypogonadism - please schedule appt.

## 2020-06-03 ENCOUNTER — Other Ambulatory Visit: Payer: Self-pay | Admitting: Family Medicine

## 2020-06-03 DIAGNOSIS — F419 Anxiety disorder, unspecified: Secondary | ICD-10-CM

## 2020-07-15 ENCOUNTER — Other Ambulatory Visit: Payer: Self-pay | Admitting: Family Medicine

## 2020-07-15 DIAGNOSIS — I10 Essential (primary) hypertension: Secondary | ICD-10-CM

## 2020-08-14 ENCOUNTER — Other Ambulatory Visit: Payer: Self-pay | Admitting: Family Medicine

## 2020-08-14 DIAGNOSIS — F419 Anxiety disorder, unspecified: Secondary | ICD-10-CM

## 2020-08-16 NOTE — Telephone Encounter (Signed)
LFD 01/31/20 #90 with 5 refills LOV 12/10/19 NOV 08/18/20

## 2020-08-18 ENCOUNTER — Encounter: Payer: Self-pay | Admitting: Family Medicine

## 2020-08-18 ENCOUNTER — Ambulatory Visit: Payer: BC Managed Care – PPO | Admitting: Family Medicine

## 2020-08-18 ENCOUNTER — Other Ambulatory Visit: Payer: Self-pay

## 2020-08-18 VITALS — BP 128/76 | HR 57 | Temp 98.3°F | Resp 16 | Ht 72.0 in | Wt 213.4 lb

## 2020-08-18 DIAGNOSIS — E785 Hyperlipidemia, unspecified: Secondary | ICD-10-CM | POA: Diagnosis not present

## 2020-08-18 DIAGNOSIS — E291 Testicular hypofunction: Secondary | ICD-10-CM | POA: Diagnosis not present

## 2020-08-18 DIAGNOSIS — F419 Anxiety disorder, unspecified: Secondary | ICD-10-CM

## 2020-08-18 DIAGNOSIS — Z5181 Encounter for therapeutic drug level monitoring: Secondary | ICD-10-CM | POA: Diagnosis not present

## 2020-08-18 LAB — COMPREHENSIVE METABOLIC PANEL
ALT: 23 U/L (ref 0–53)
AST: 23 U/L (ref 0–37)
Albumin: 4.7 g/dL (ref 3.5–5.2)
Alkaline Phosphatase: 77 U/L (ref 39–117)
BUN: 10 mg/dL (ref 6–23)
CO2: 28 mEq/L (ref 19–32)
Calcium: 9.8 mg/dL (ref 8.4–10.5)
Chloride: 101 mEq/L (ref 96–112)
Creatinine, Ser: 1.1 mg/dL (ref 0.40–1.50)
GFR: 72.39 mL/min (ref >60.00)
Glucose, Bld: 92 mg/dL (ref 70–99)
Potassium: 4.2 mEq/L (ref 3.5–5.1)
Sodium: 139 mEq/L (ref 135–145)
Total Bilirubin: 0.6 mg/dL (ref 0.2–1.2)
Total Protein: 7.3 g/dL (ref 6.0–8.3)

## 2020-08-18 LAB — CBC
HCT: 43.1 % (ref 39.0–52.0)
Hemoglobin: 14.8 g/dL (ref 13.0–17.0)
MCHC: 34.3 g/dL (ref 30.0–36.0)
MCV: 89 fl (ref 78.0–100.0)
Platelets: 244 10*3/uL (ref 150.0–400.0)
RBC: 4.84 Mil/uL (ref 4.22–5.81)
RDW: 13.5 % (ref 11.5–15.5)
WBC: 8.6 10*3/uL (ref 4.0–10.5)

## 2020-08-18 LAB — LIPID PANEL
Cholesterol: 175 mg/dL (ref 0–200)
HDL: 53.7 mg/dL (ref >39.00)
LDL Cholesterol: 93 mg/dL (ref 0–99)
NonHDL: 121.01
Total CHOL/HDL Ratio: 3
Triglycerides: 139 mg/dL (ref 0.0–149.0)
VLDL: 27.8 mg/dL (ref 0.0–40.0)

## 2020-08-18 LAB — TESTOSTERONE: Testosterone: 201.5 ng/dL — ABNORMAL LOW (ref 300.00–890.00)

## 2020-08-18 LAB — PSA: PSA: 1.01 ng/mL (ref 0.10–4.00)

## 2020-08-18 MED ORDER — ROSUVASTATIN CALCIUM 5 MG PO TABS: ORAL_TABLET | ORAL | 2 refills | Status: DC

## 2020-08-18 MED ORDER — TESTOSTERONE CYPIONATE 200 MG/ML IM SOLN: INTRAMUSCULAR | 2 refills | Status: DC

## 2020-08-18 MED ORDER — TRAZODONE HCL 50 MG PO TABS: 50.0000 mg | ORAL_TABLET | Freq: Every evening | ORAL | 2 refills | Status: DC | PRN

## 2020-08-18 NOTE — Patient Instructions (Addendum)
Ok to continue trazodone and hydroxyzine if needed for now, but I would consider restarting buspar if you are noticing more persistent anxiety symptoms. You may need a higher dose than used previously.   Depending on labs, can check testosterone at different interval form your injection to decide on med changes if needed. Follow up for physical in next 3 months. Let me know if there are questions sooner and thanks for coming in today.

## 2020-08-18 NOTE — Progress Notes (Signed)
Subjective:  Patient ID: Curtis Yang, male    DOB: 1960-10-06  Age: 60 y.o. MRN: [id number]  CC:  Chief Complaint  Patient presents with   Hypogonadism    Pt here for refill testosterone injection    Hyperlipidemia    Pt needs refill crestor, feels well, no side effects      HPI Curtis Yang presents for   Hypogonadism: Treated with testosterone 200 mg injection every 3 weeks.  Overdue for repeat testing.  Normal in August 2021. Feeling ok on current dose. Slight fatigue at end of 3rd week injection. Last injection 2.5 weeks ago.  Lab Results  Component Value Date   TESTOSTERONE 339 10/24/2019   Lab Results  Component Value Date   PSA1 1.0 10/24/2019   PSA1 1.2 11/18/2018   PSA1 1.3 10/08/2017   PSA 0.92 09/14/2015   PSA 1.34 02/12/2015   PSA 1.55 08/06/2013   Lab Results  Component Value Date   WBC 6.5 10/24/2019   HGB 15.4 10/24/2019   HCT 45.5 10/24/2019   MCV 92 10/24/2019   PLT 237 10/24/2019   Lab Results  Component Value Date   ALT 19 10/24/2019   AST 18 10/24/2019   ALKPHOS 96 10/24/2019   BILITOT 0.6 10/24/2019    Hyperlipidemia: Crestor 5 mg daily. No new myalgias or side effects.  Lab Results  Component Value Date   CHOL 191 10/24/2019   HDL 55 10/24/2019   LDLCALC 116 (H) 10/24/2019   TRIG 109 10/24/2019   CHOLHDL 3.5 10/24/2019   Lab Results  Component Value Date   ALT 19 10/24/2019   AST 18 10/24/2019   ALKPHOS 96 10/24/2019   BILITOT 0.6 10/24/2019    Anxiety Some fatigue discussed in October 2021.  Overwhelmed at that time some increased stress with job.  Was taking hydroxyzine multiple times per day with minimal to moderate relief.  Paxil and trazodone were helping.  Was taking trazodone nightly.  Added BuSpar 5 mg twice daily with plan to taper hydroxyzine to as needed only.  Continued on Paxil, trazodone and cautioned on signs and symptoms of serotonin syndrome.  Phone numbers for counseling provided.   Currently only  taking trazodone daily. Wanted to get off paxil - did not feel like he needed it and concern of weight gain. Off for past few months and feels fine off med.  Sleep ok with trazodone - most days per week takes 100mg - works better.  Hydroxyzine - 4 times per week for anxiety.  Took buspar few months  no side effects, not sure it helped - none recently.  No si/hi.    Depression screen [facility] 2/9 08/18/2020 12/10/2019 10/24/2019 12/27/2018 11/11/2018  Decreased Interest 0 0 0 0 0  Down, Depressed, Hopeless 0 0 0 0 0  PHQ - 2 Score 0 0 0 0 0  Altered sleeping 1 - - - -  Tired, decreased energy 1 - - - -  Change in appetite 0 - - - -  Feeling bad or failure about yourself  0 - - - -  Trouble concentrating 0 - - - -  Moving slowly or fidgety/restless 0 - - - -  Suicidal thoughts 0 - - - -  PHQ-9 Score 2 - - - -   GAD 7 : Generalized Anxiety Score 08/18/2020 12/10/2019  Nervous, Anxious, on Edge 0 2  Control/stop worrying 0 1  Worry too much - different things 1 2  Trouble relaxing 0 1  Restless 0 1  Easily annoyed or irritable 0 1  Afraid - awful might happen 0 1  Total GAD 7 Score 1 9  Anxiety Difficulty - Somewhat difficult       History Patient Active Problem List   Diagnosis Date Noted   Essential hypertension 03/27/2016   Lumbar stenosis 09/16/2015   Gastroesophageal reflux disease without esophagitis 02/25/2014   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   Benign skin lesion of nose    Depression    Erectile dysfunction    Due to decreased testosterone    GERD (gastroesophageal reflux disease)     NO TROUBLE IN YEARS   History of colon polyps    Hyperlipidemia    Hypertension    Hypogonadism male    Increased glucose level    Insomnia    Low back pain    Past Surgical History:  Procedure Laterality Date   BREAST MASS EXCISION     RIGHT CHEST  BENIGN   COLONOSCOPY W/ POLYPECTOMY  09/2013   One polyp, came back pre-cancerous. Repeat 5 years unless symptoms present.    EYE SURGERY  02/28/2000   Lasik   MASS EXCISION     RIGHT EAR   BENIGN   SPINE SURGERY  09/26/2012   [facility]-L[facility] [place].   TRANSFORAMINAL LUMBAR INTERBODY FUSION (TLIF) WITH PEDICLE SCREW FIXATION 1 LEVEL Right 04/22/2013   Procedure: TRANSFORAMINAL LUMBAR INTERBODY FUSION (TLIF) WITH PEDICLE SCREW FIXATION LUMBAR FIVE SACRAL ONE;  Surgeon: Faythe Ghee, MD;  Location: [facility];  Service: Neurosurgery;  Laterality: Right;   No Known Allergies Prior to Admission medications   Medication Sig Start Date End Date Taking? Authorizing Provider  busPIRone (BUSPAR) 5 MG tablet TAKE 1 TABLET(5 MG) BY MOUTH TWICE DAILY 04/01/20  Yes Wendie Agreste, MD  hydrOXYzine (ATARAX/VISTARIL) 25 MG tablet TAKE 1 TABLET(25 MG) BY MOUTH EVERY 8 HOURS AS NEEDED FOR ANXIETY 08/17/20  Yes Wendie Agreste, MD  metoprolol succinate (TOPROL-XL) 100 MG 24 hr tablet TAKE 1 TABLET(100 MG) BY MOUTH DAILY WITH OR IMMEDIATELY FOLLOWING A MEAL 07/15/20  Yes Wendie Agreste, MD  PARoxetine (PAXIL) 40 MG tablet Take 1 tablet (40 mg total) by mouth daily. 10/24/19  Yes Wendie Agreste, MD  rosuvastatin (CRESTOR) 5 MG tablet TAKE 1 TABLET(5 MG) BY MOUTH DAILY 04/24/20  Yes Wendie Agreste, MD  SYRINGE-NEEDLE, DISP, 3 ML ([employer] SYRINGE) 22G X 1-1/2" 3 ML MISC USE AS DIRECTED TO SELF-ADMINISTER TESTOSTERONE 11/11/18  Yes Wendie Agreste, MD  testosterone cypionate (DEPOTESTOSTERONE CYPIONATE) 200 MG/ML injection INJECT 1 ML UNDER THE SKIN EVERY 3 WEEK 05/18/20  Yes Wendie Agreste, MD  traZODone (DESYREL) 50 MG  tablet TAKE 1 TABLET(50 MG) BY MOUTH AT BEDTIME AS NEEDED FOR SLEEP 05/12/20  Yes Wendie Agreste, MD   Social History   Socioeconomic History   Marital status: Married    Spouse name: Not on file   Number of children: Not on file   Years of education: Not on file   Highest  education level: Not on file  Occupational History   Not on file  Tobacco Use   Smoking status: Never   Smokeless tobacco: Never  Vaping Use   Vaping Use: Never used  Substance and Sexual Activity   Alcohol use: No   Drug use: No   Sexual activity: Yes    Comment: number of sex partners in the last 12 months  1  Other Topics Concern   Not on file  Social History Narrative   Marital status:  Married x 15 years; happily married; second marriage; first wife died AMI age 67.        Children:  1 child; 3 stepchildren.  Several grandchildren.      Lives: with wife.      Employment:  [employer]; Print production planner x 19 years; happy.  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        Tobacco: never       Alcohol:  None      Drugs: none      Exercise:  Walking twice daily 2-3 miles total each day.  Job physically demanding.        Seatbelt: 100%; no texting      Guns: yes; unloaded; in safe.      Sunscreen: SPF on face.   Social Determinants of Health   Financial Resource Strain: Not on file  Food Insecurity: Not on file  Transportation Needs: Not on file  Physical Activity: Not on file  Stress: Not on file  Social Connections: Not on file  Intimate Partner Violence: Not on file    Review of Systems  Per HPI.  Objective:   Vitals:   08/18/20 1039  BP: 128/76  Pulse: (!) 57  Resp: 16  Temp: 98.3 F (36.8 C)  TempSrc: Temporal  SpO2: 97%  Weight: 213 lb 6.4 oz (96.8 kg)  Height: 6' (1.829 m)     Physical Exam Vitals reviewed.  Constitutional:      Appearance: He is well-developed.  HENT:     Head: Normocephalic and atraumatic.  Neck:     Vascular: No carotid bruit or JVD.   Cardiovascular:     Rate and Rhythm: Normal rate and regular rhythm.     Heart sounds: Normal heart sounds. No murmur heard. Pulmonary:     Effort: Pulmonary effort is normal.     Breath sounds: Normal breath sounds. No rales.  Musculoskeletal:     Right lower leg: No edema.     Left lower leg: No edema.  Skin:    General: Skin is warm and dry.  Neurological:     Mental Status: He is alert and oriented to person, place, and time.  Psychiatric:        Mood and Affect: Mood normal.        Behavior: Behavior normal.        Thought Content: Thought content normal.     31 minutes spent during visit, including chart review, counseling and assimilation of information, exam, discussion of plan, and chart completion.    Assessment & Plan:  CHIDUBEM CHAIRES is a 60 y.o. male . Hyperlipidemia, unspecified hyperlipidemia type  -  Stable, tolerating current regimen. Medications refilled. Labs pending as above.   Hypogonadism in male, medication monitoring.   - check labs - CBC, testosterone, lfts, lipids, psa.   Anxiety - reports overall stable sx on just trazadone with hydroxyzine as needed. Option of restarting buspar if increased sx's or need  for hydroxyzine. May need higher dose. Ok to hold on paxil at this time. Rtc precautions given.   Meds ordered this encounter  Medications   rosuvastatin (CRESTOR) 5 MG tablet    Sig: TAKE 1 TABLET(5 MG) BY MOUTH DAILY    Dispense:  90 tablet    Refill:  2   testosterone cypionate (DEPOTESTOSTERONE CYPIONATE) 200 MG/ML injection    Sig: INJECT 1 ML UNDER THE SKIN EVERY 3 WEEK    Dispense:  4 mL    Refill:  2   traZODone (DESYREL) 50 MG tablet    Sig: Take 1-2 tablets (50-100 mg total) by mouth at bedtime as needed for sleep.    Dispense:  180 tablet    Refill:  2   Patient Instructions  Ok to continue trazodone and hydroxyzine if needed for now, but I would consider restarting buspar if you are noticing more persistent anxiety  symptoms. You may need a higher dose than used previously.   Depending on labs, can check testosterone at different interval form your injection to decide on med changes if needed. Follow up for physical in next 3 months. Let me know if there are questions sooner and thanks for coming in today.     Signed,   Merri Ray, MD [place] 08/18/20 10:51 AM

## 2020-08-24 DIAGNOSIS — M4722 Other spondylosis with radiculopathy, cervical region: Secondary | ICD-10-CM | POA: Diagnosis not present

## 2020-08-24 DIAGNOSIS — M542 Cervicalgia: Secondary | ICD-10-CM | POA: Diagnosis not present

## 2020-08-24 DIAGNOSIS — M5459 Other low back pain: Secondary | ICD-10-CM | POA: Diagnosis not present

## 2020-08-29 ENCOUNTER — Encounter: Payer: Self-pay | Admitting: Family Medicine

## 2020-08-29 DIAGNOSIS — E291 Testicular hypofunction: Secondary | ICD-10-CM

## 2020-08-31 MED ORDER — TESTOSTERONE CYPIONATE 200 MG/ML IM SOLN: 200.0000 mg | INTRAMUSCULAR | 2 refills | Status: DC

## 2020-09-01 ENCOUNTER — Encounter (INDEPENDENT_AMBULATORY_CARE_PROVIDER_SITE_OTHER): Payer: Self-pay

## 2021-04-08 ENCOUNTER — Other Ambulatory Visit: Payer: Self-pay

## 2021-04-08 DIAGNOSIS — E291 Testicular hypofunction: Secondary | ICD-10-CM

## 2021-04-08 MED ORDER — TESTOSTERONE CYPIONATE 200 MG/ML IM SOLN: 200.0000 mg | INTRAMUSCULAR | 0 refills | Status: DC

## 2021-04-08 NOTE — Telephone Encounter (Signed)
Controlled substance database reviewed, last filled 02/09/2021.  I do see that he has an appointment with me in April.  Refill ordered.

## 2021-04-08 NOTE — Telephone Encounter (Signed)
Pt requesting refill testosterone

## 2021-04-25 ENCOUNTER — Other Ambulatory Visit: Payer: Self-pay | Admitting: Family Medicine

## 2021-04-25 DIAGNOSIS — F419 Anxiety disorder, unspecified: Secondary | ICD-10-CM

## 2021-05-10 ENCOUNTER — Other Ambulatory Visit: Payer: Self-pay | Admitting: Family Medicine

## 2021-05-10 DIAGNOSIS — F419 Anxiety disorder, unspecified: Secondary | ICD-10-CM

## 2021-05-10 DIAGNOSIS — I10 Essential (primary) hypertension: Secondary | ICD-10-CM

## 2021-05-10 NOTE — Telephone Encounter (Signed)
Patient is requesting a refill of the following medications: ?Requested Prescriptions  ? ?Pending Prescriptions Disp Refills  ? traZODone (DESYREL) 50 MG tablet [Pharmacy Med Name: TRAZODONE '50MG'$  TABLETS] 180 tablet 2  ?  Sig: TAKE 1 TO 2 TABLETS(50 TO 100 MG) BY MOUTH AT BEDTIME AS NEEDED FOR SLEEP  ? metoprolol succinate (TOPROL-XL) 100 MG 24 hr tablet [Pharmacy Med Name: METOPROLOL ER SUCCINATE '100MG'$  TABS] 90 tablet 1  ?  Sig: TAKE 1 TABLET(100 MG) BY MOUTH DAILY WITH OR IMMEDIATELY FOLLOWING A MEAL  ? ? ?Date of patient request: 05/10/21 ?Last office visit: 08/18/20 ?Date of last refill: 08/18/20 ?Last refill amount: 90 ? ? ?

## 2021-05-30 ENCOUNTER — Ambulatory Visit (INDEPENDENT_AMBULATORY_CARE_PROVIDER_SITE_OTHER): Payer: BC Managed Care – PPO | Admitting: Family Medicine

## 2021-05-30 ENCOUNTER — Encounter: Payer: Self-pay | Admitting: Family Medicine

## 2021-05-30 VITALS — BP 130/74 | HR 57 | Temp 98.1°F | Resp 17 | Ht 72.0 in | Wt 210.2 lb

## 2021-05-30 DIAGNOSIS — Z0000 Encounter for general adult medical examination without abnormal findings: Secondary | ICD-10-CM | POA: Diagnosis not present

## 2021-05-30 DIAGNOSIS — Z130 Encounter for screening for diseases of the blood and blood-forming organs and certain disorders involving the immune mechanism: Secondary | ICD-10-CM | POA: Diagnosis not present

## 2021-05-30 DIAGNOSIS — Z125 Encounter for screening for malignant neoplasm of prostate: Secondary | ICD-10-CM | POA: Diagnosis not present

## 2021-05-30 DIAGNOSIS — F419 Anxiety disorder, unspecified: Secondary | ICD-10-CM

## 2021-05-30 DIAGNOSIS — Z131 Encounter for screening for diabetes mellitus: Secondary | ICD-10-CM

## 2021-05-30 DIAGNOSIS — I10 Essential (primary) hypertension: Secondary | ICD-10-CM

## 2021-05-30 DIAGNOSIS — Z5181 Encounter for therapeutic drug level monitoring: Secondary | ICD-10-CM | POA: Diagnosis not present

## 2021-05-30 DIAGNOSIS — D229 Melanocytic nevi, unspecified: Secondary | ICD-10-CM

## 2021-05-30 DIAGNOSIS — E785 Hyperlipidemia, unspecified: Secondary | ICD-10-CM

## 2021-05-30 DIAGNOSIS — M7702 Medial epicondylitis, left elbow: Secondary | ICD-10-CM

## 2021-05-30 DIAGNOSIS — E291 Testicular hypofunction: Secondary | ICD-10-CM

## 2021-05-30 DIAGNOSIS — Z1283 Encounter for screening for malignant neoplasm of skin: Secondary | ICD-10-CM

## 2021-05-30 LAB — LIPID PANEL
Cholesterol: 174 mg/dL (ref 0–200)
HDL: 59.8 mg/dL (ref >39.00)
LDL Cholesterol: 95 mg/dL (ref 0–99)
NonHDL: 114.45
Total CHOL/HDL Ratio: 3
Triglycerides: 96 mg/dL (ref 0.0–149.0)
VLDL: 19.2 mg/dL (ref 0.0–40.0)

## 2021-05-30 LAB — CBC WITH DIFFERENTIAL/PLATELET
Basophils Absolute: 0.1 10*3/uL (ref 0.0–0.1)
Basophils Relative: 1.1 % (ref 0.0–3.0)
Eosinophils Absolute: 0.2 10*3/uL (ref 0.0–0.7)
Eosinophils Relative: 3.1 % (ref 0.0–5.0)
HCT: 46.2 % (ref 39.0–52.0)
Hemoglobin: 15.5 g/dL (ref 13.0–17.0)
Lymphocytes Relative: 26 % (ref 12.0–46.0)
Lymphs Abs: 1.6 10*3/uL (ref 0.7–4.0)
MCHC: 33.6 g/dL (ref 30.0–36.0)
MCV: 91.3 fl (ref 78.0–100.0)
Monocytes Absolute: 0.4 10*3/uL (ref 0.1–1.0)
Monocytes Relative: 7 % (ref 3.0–12.0)
Neutro Abs: 3.8 10*3/uL (ref 1.4–7.7)
Neutrophils Relative %: 62.8 % (ref 43.0–77.0)
Platelets: 212 10*3/uL (ref 150.0–400.0)
RBC: 5.06 Mil/uL (ref 4.22–5.81)
RDW: 13.5 % (ref 11.5–15.5)
WBC: 6 10*3/uL (ref 4.0–10.5)

## 2021-05-30 LAB — COMPREHENSIVE METABOLIC PANEL
ALT: 15 U/L (ref 0–53)
AST: 14 U/L (ref 0–37)
Albumin: 4.5 g/dL (ref 3.5–5.2)
Alkaline Phosphatase: 68 U/L (ref 39–117)
BUN: 16 mg/dL (ref 6–23)
CO2: 28 mEq/L (ref 19–32)
Calcium: 9.7 mg/dL (ref 8.4–10.5)
Chloride: 102 mEq/L (ref 96–112)
Creatinine, Ser: 1.07 mg/dL (ref 0.40–1.50)
GFR: 74.42 mL/min (ref >60.00)
Glucose, Bld: 94 mg/dL (ref 70–99)
Potassium: 4.3 mEq/L (ref 3.5–5.1)
Sodium: 138 mEq/L (ref 135–145)
Total Bilirubin: 0.5 mg/dL (ref 0.2–1.2)
Total Protein: 7 g/dL (ref 6.0–8.3)

## 2021-05-30 LAB — TESTOSTERONE: Testosterone: 131.78 ng/dL — ABNORMAL LOW (ref 300.00–890.00)

## 2021-05-30 LAB — PSA: PSA: 1.09 ng/mL (ref 0.10–4.00)

## 2021-05-30 LAB — HEMOGLOBIN A1C: Hgb A1c MFr Bld: 6.1 % (ref 4.6–6.5)

## 2021-05-30 MED ORDER — HYDROXYZINE HCL 25 MG PO TABS: ORAL_TABLET | ORAL | 3 refills | Status: DC

## 2021-05-30 MED ORDER — METOPROLOL SUCCINATE ER 100 MG PO TB24: ORAL_TABLET | ORAL | 2 refills | Status: DC

## 2021-05-30 MED ORDER — TRAZODONE HCL 50 MG PO TABS: ORAL_TABLET | ORAL | 2 refills | Status: DC

## 2021-05-30 MED ORDER — ROSUVASTATIN CALCIUM 5 MG PO TABS: ORAL_TABLET | ORAL | 2 refills | Status: DC

## 2021-05-30 MED ORDER — TESTOSTERONE CYPIONATE 200 MG/ML IM SOLN: 200.0000 mg | INTRAMUSCULAR | 0 refills | Status: DC

## 2021-05-30 NOTE — Patient Instructions (Addendum)
I will refer you to dermatology for the moles on the chest as well as the sore area on the right ear.  No med changes for now.  I will let you know if there are concerns with your labs. ? ?Left elbow pain appears to be golfers elbow.  Try to examine your day-to-day activities and see if there is anything that is repetitive that may be causing some soreness of that area.  See information below.  Over-the-counter elbow compression brace is an option if needed.  Follow-up in the next 3 to 4 weeks if not improving, sooner if worse. ? ?Let me know if there are questions.  Thanks for coming in today. ? ?Golfer's Elbow ?Golfer's elbow (medial epicondylitis) is a condition that results from inflammation of the strong bands of tissue (tendons) that attach your forearm muscles to the inside of your bone at the elbow. These tendons affect the muscles that bend the palm toward the wrist (flexion). The tendons become less flexible with age. ?This condition is called golfer's elbow because it is more common among people who constantly bend and twist their wrists, such as golfers. This injury is usually caused by repeated use of the same muscles. ?What are the causes? ?This condition is caused by: ?Repeatedly flexing, turning, or twisting your wrist. ?Frequently gripping objects with your hands. ?Sudden injury. ?What increases the risk? ?This condition is more likely to develop in people who play golf, baseball, or tennis. This injury is more common among people who have jobs that require the constant use of their hands, such as: ?People who use computers. ?Carpenters. ?Butchers. ?Musicians. ?What are the signs or symptoms? ?This condition causes elbow pain that may spread to your forearm and upper arm. Symptoms of this condition include: ?Pain at the inner elbow, forearm, or wrist. ?A weak grip in the hand. ?The pain may get worse when you bend your wrist downward. ?How is this diagnosed? ?This condition is diagnosed based on  your symptoms, your medical history, and a physical exam. During the exam, your health care provider may: ?Test your grip strength. ?Move your wrist to check for pain. ?You may also have an MRI to: ?Confirm the diagnosis. ?Look for other issues. ?Check for tears in the ligaments, muscles, or tendons. ?How is this treated? ?Treatment for this condition includes: ?Stopping all activities that make you bend or twist your elbow or wrist and waiting until your pain and other symptoms go away before resuming those activities. ?Wearing an elbow brace or wrist splint to restrict the movements that cause symptoms. ?Icing your inner elbow, forearm, or wrist to relieve pain. ?Taking NSAIDs, such as ibuprofen, or getting corticosteroid injections to reduce pain and swelling. ?Doing stretching, range-of-motion, and strengthening exercises (physical therapy) as told by your health care provider. ?In rare cases, surgery may be needed if your condition does not improve. ?Follow these instructions at home: ?If you have a brace or splint: ?Wear the brace or splint as told by your health care provider. Remove it only as told by your health care provider. ?Check the skin around the brace or splint every day. Tell your health care provider about any concerns. ?Loosen the brace or splint if your fingers tingle, become numb, or turn cold and blue. ?Keep it clean. ?If the brace or splint is not waterproof: ?Do not let it get wet. ?Cover it with a watertight covering when you take a bath or shower. ?Managing pain, stiffness, and swelling ? ?If directed, put ice  on the injured area. To do this: ?If you have a removable brace or splint, remove it as told by your health care provider. ?Put ice in a plastic bag. ?Place a towel between your skin and the bag. ?Leave the ice on for 20 minutes, 2-3 times a day. ?Remove the ice if your skin turns bright red. This is very important. If you cannot feel pain, heat, or cold, you have a greater risk of  damage to the area. ?Move your fingers often to avoid stiffness and swelling. ?Activity ?Rest your injured area as told by your health care provider. ?Return to your normal activities as told by your health care provider. Ask your health care provider what activities are safe for you. ?Do exercises as told by your health care provider. ?Lifestyle ?If your condition is caused by sports, work with a trainer to make sure that you: ?Use the correct technique. ?Use the proper equipment. ?If your condition is work related, talk with your employer about ways to manage your condition at work. ?General instructions ?Take over-the-counter and prescription medicines only as told by your health care provider. ?Do not use any products that contain nicotine or tobacco. These products include cigarettes, chewing tobacco, and vaping devices, such as e-cigarettes. If you need help quitting, ask your health care provider. ?Keep all follow-up visits. This is important. ?How is this prevented? ?Before and after activity: ?Warm up and stretch before being active. ?Cool down and stretch after being active. ?Give your body time to rest between periods of activity. ?During activity: ?Make sure to use equipment that fits you. ?If you play golf, slow your golf swing to reduce shock in the arm when making contact with the ball. ?Maintain physical fitness, including: ?Strength. ?Flexibility. ?Endurance. ?Do exercises to strengthen the forearm muscles. ?Contact a health care provider if: ?Your pain does not improve or it gets worse. ?You notice numbness in your hand. ?Get help right away if: ?Your pain is severe. ?You cannot move your wrist. ?Summary ?Golfer's elbow, also called medial epicondylitis, is a condition that results from inflammation of the strong bands of tissue (tendons) that attach your forearm muscles to the inside of your bone at the elbow. ?This injury usually results from overuse. ?Symptoms of this condition include decreased  grip strength and pain at the inner elbow, forearm, or wrist. ?This injury is treated with rest, a brace or splint, ice, medicines, physical therapy, and surgery as needed. ?This information is not intended to replace advice given to you by your health care provider. Make sure you discuss any questions you have with your health care provider. ?Document Revised: 08/26/2019 Document Reviewed: 08/26/2019 ?Elsevier Patient Education ? [place]. ? ?Tolerating current regimen, continue same, check labs reports overall stable symptoms.  Discussed BuSpar in place of hydroxyzine but did not feel well on that medication in the past reports overall stable symptoms updated labs ordered.  Labs ordered.  Labs ordered Preventive Care 58-6 Years Old, Male ?Preventive care refers to lifestyle choices and visits with your health care provider that can promote health and wellness. Preventive care visits are also called wellness exams. ?What can I expect for my preventive care visit? ?Counseling ?During your preventive care visit, your health care provider may ask about your: ?Medical history, including: ?Past medical problems. ?Family medical history. ?Current health, including: ?Emotional well-being. ?Home life and relationship well-being. ?Sexual activity. ?Lifestyle, including: ?Alcohol, nicotine or tobacco, and drug use. ?Access to firearms. ?Diet, exercise, and sleep habits. ?Safety  issues such as seatbelt and bike helmet use. ?Sunscreen use. ?Work and work Statistician. ?Physical exam ?Your health care provider will check your: ?Height and weight. These may be used to calculate your BMI (body mass index). BMI is a measurement that tells if you are at a healthy weight. ?Waist circumference. This measures the distance around your waistline. This measurement also tells if you are at a healthy weight and may help predict your risk of certain diseases, such as type 2 diabetes and high blood pressure. ?Heart rate and blood  pressure. ?Body temperature. ?Skin for abnormal spots. ?What immunizations do I need? ?Vaccines are usually given at various ages, according to a schedule. Your health care provider will recommend vaccines

## 2021-05-30 NOTE — Progress Notes (Signed)
? ?Subjective:  ?Patient ID: Curtis Yang, male    DOB: 1960/05/04  Age: 61 y.o. MRN: [id number] ? ?CC:  ?Chief Complaint  ?Patient presents with  ? Annual Exam  ?  Pt here for annual exam would like you to check a few moles on his neck for concern or see Derm is recommended  ?  ? Health Maintenance  ?  Pt [facility] [place] will [facility] record, has had Shingles vaccine will check [facility]  ?Does want flu shot if able   ? Depression  ?  PHQ9 =4 ?  ? ? ?HPI ?Curtis Yang presents for Annual Exam ? ?Hypogonadism ?Testosterone 200 mg every 2 weeks, injection.  Labs today.  PSA has been stable. ?Last injection 2 weeks ago. Due today.  ?Lab Results  ?Component Value Date  ? TESTOSTERONE 201.50 (L) 08/18/2020  ? ?Hyperlipidemia: ?Crestor 5 mg daily, no new myalgias, side effects.  ?Lab Results  ?Component Value Date  ? CHOL 175 08/18/2020  ? HDL 53.70 08/18/2020  ? [place] 93 08/18/2020  ? TRIG 139.0 08/18/2020  ? CHOLHDL 3 08/18/2020  ? ?Lab Results  ?Component Value Date  ? ALT 23 08/18/2020  ? AST 23 08/18/2020  ? ALKPHOS 77 08/18/2020  ? BILITOT 0.6 08/18/2020  ? ?Hypertension: ?Toprol '100mg'$  qd. No new side effects, no cp/palpitations.  ?Home readings: ?BP Readings from Last 3 Encounters:  ?05/30/21 130/74  ?08/18/20 128/76  ?12/10/19 126/81  ? ?Lab Results  ?Component Value Date  ? CREATININE 1.10 08/18/2020  ? ? ?Anxiety ?Previously treated with Paxil, was able to wean off that medication and treat with trazodone only.  100 mg most nights per week.  Has taken hydroxyzine previously for up to 4 times per week for anxiety.  Previously on BuSpar, option to restart when discussed at his June 2022 visit. ?Stable with trazadone nightly, hydroxyzine - few per day. No sedation or side effects, declines buspar repeat trial.  ? ? ? ?  05/30/2021  ?  8:04 AM 08/18/2020  ? 10:42 AM 12/10/2019  ?  9:46 AM 10/24/2019  ?  1:43 PM 12/27/2018  ?  8:37 AM  ?Depression screen PHQ 2/9  ?Decreased Interest 0 0  0 0 0  ?Down, Depressed, Hopeless 0 0 0 0 0  ?PHQ - 2 Score 0 0 0 0 0  ?Altered sleeping 2 1     ?Tired, decreased energy 1 1     ?Change in appetite 0 0     ?Feeling bad or failure about yourself  0 0     ?Trouble concentrating 1 0     ?Moving slowly or fidgety/restless 0 0     ?Suicidal thoughts 0 0     ?PHQ-9 Score 4 2     ? ? ?Health Maintenance  ?Topic Date Due  ? Zoster Vaccines- Shingrix (1 of 2) Never done  ? COVID-19 Vaccine (4 - Booster for [place] series) 06/15/2021 (Originally 05/14/2020)  ? INFLUENZA VACCINE  09/27/2021  ? TETANUS/TDAP  10/17/2021  ? COLONOSCOPY (Pts 45-38yr Insurance coverage will need to be confirmed)  12/25/2025  ? Hepatitis C Screening  Completed  ? HIV Screening  Completed  ? HPV VACCINES  Aged Out  ?Shingles vaccine: at pharmacy.  ? ? ?Colonoscopy 12/26/2018, repeat 7 years. ?Derm: few moles on neck, upper chest. Some changes - roughened, changing shape.  Area on upper ear - sore at times.  ?Prostate: does not have family  history of prostate cancer ?The natural history of prostate cancer and ongoing controversy regarding screening and potential treatment outcomes of prostate cancer has been discussed with the patient. The meaning of a false positive PSA and a false negative PSA has been discussed. He indicates understanding of the limitations of this screening test and wishes  to proceed with screening PSA testing as also needed for monitoring with hypogonadism treatment. ?Lab Results  ?Component Value Date  ? PSA1 1.0 10/24/2019  ? PSA1 1.2 11/18/2018  ? PSA1 1.3 10/08/2017  ? PSA 1.01 08/18/2020  ? PSA 0.92 09/14/2015  ? PSA 1.34 02/12/2015  ? ? ? ? ?Immunization History  ?Administered Date(s) Administered  ? DTaP 07/04/2004, 11/14/2007  ? Hepatitis A 04/20/2004, 09/26/2004  ? Influenza, Seasonal, Injecte, Preservative Fre 01/24/2012  ? Influenza,inj,Quad PF,6+ Mos 01/20/2013, 12/05/2013, 02/12/2015, 02/09/2016, 12/26/2016, 11/18/2018, 10/24/2019  ? Influenza-Unspecified  03/29/2017, 10/04/2017, 10/21/2018  ? PFIZER(Purple Top)SARS-COV-2 Vaccination 05/23/2019, 06/17/2019, 03/19/2020  ? PPD Test 04/04/2012  ? Tdap 10/18/2011  ? Zoster, Live 01/21/2013  ? ? ?No results found. ?Optho - appt Friday.  ? ?Dental:Within Last 8 months, 2 times per year.  ? ?Alcohol: 0-1 per week..  ? ?Tobacco: none.  ? ?Exercise: walking 3 times per week, 35-40 min and physical job.  ? ?L elbow pain:  ?Past few months. NKI. Could have picked up something heavy.  ?R hand dominant.  ?No swelling/bruising. No weakness. Off and on flairs of pain.  ?Tx: icy hot.  ? ? ?History ?Patient Active Problem List  ? Diagnosis Date Noted  ? Essential hypertension 03/27/2016  ? Lumbar stenosis 09/16/2015  ? Gastroesophageal reflux disease without esophagitis 02/25/2014  ? 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 Depression 03/15/2011  ? Insomnia 03/15/2011  ? Anxiety 03/15/2011  ? Increased serum lipids 03/15/2011  ? Low back pain 03/15/2011  ? Erectile dysfunction 03/15/2011  ? Increased glucose level 03/15/2011  ? Testosterone deficiency 03/15/2011  ? History of colon polyps 03/15/2011  ? ?Past Medical History:  ?Diagnosis Date  ? Allergy   ? Anxiety   ? Arthritis   ? DDD lumbar.  ? Benign skin lesion of nose   ? Depression   ? Erectile dysfunction   ? Due to decreased testosterone   ? GERD (gastroesophageal reflux disease)   ? NO TROUBLE IN YEARS  ? History of colon polyps   ? Hyperlipidemia   ? Hypertension   ? Hypogonadism male   ? Increased glucose level   ? Insomnia   ? Low back pain   ? ?Past Surgical History:  ?Procedure Laterality Date  ? BREAST MASS EXCISION    ? RIGHT CHEST  BENIGN  ? COLONOSCOPY W/ POLYPECTOMY  09/2013  ? One polyp, came back pre-cancerous. Repeat 5 years unless symptoms present.   ? EYE SURGERY  02/28/2000  ? Lasik  ? MASS EXCISION    ? RIGHT EAR   BENIGN  ? SPINE SURGERY  09/26/2012  ? [facility] [place].  ? TRANSFORAMINAL LUMBAR INTERBODY FUSION (TLIF) WITH  PEDICLE SCREW FIXATION 1 LEVEL Right 04/22/2013  ? Procedure: TRANSFORAMINAL LUMBAR INTERBODY FUSION (TLIF) WITH PEDICLE SCREW FIXATION LUMBAR FIVE SACRAL ONE;  Surgeon: Faythe Ghee, MD;  Location: [facility];  Service: Neurosurgery;  Laterality: Right;  ? ?No Known Allergies ?Prior to Admission medications   ?Medication Sig Start Date End Date Taking? Authorizing Provider  ?busPIRone (BUSPAR) 5 MG tablet TAKE 1 TABLET(5 MG) BY MOUTH TWICE DAILY  04/01/20   Wendie Agreste, MD  ?hydrOXYzine (ATARAX) 25 MG tablet TAKE 1 TABLET(25 MG) BY MOUTH EVERY 8 HOURS AS NEEDED FOR ANXIETY 04/25/21   Wendie Agreste, MD  ?metoprolol succinate (TOPROL-XL) 100 MG 24 hr tablet TAKE 1 TABLET(100 MG) BY MOUTH DAILY WITH OR IMMEDIATELY FOLLOWING A MEAL 05/10/21   Wendie Agreste, MD  ?rosuvastatin (CRESTOR) 5 MG tablet TAKE 1 TABLET(5 MG) BY MOUTH DAILY 08/18/20   Wendie Agreste, MD  ?SYRINGE-NEEDLE, DISP, 3 ML (VANISHPOINT SAFETY SYRINGE) 22G X 1-1/2" 3 ML MISC USE AS DIRECTED TO SELF-ADMINISTER TESTOSTERONE 11/11/18   Wendie Agreste, MD  ?testosterone cypionate (DEPOTESTOSTERONE CYPIONATE) 200 MG/ML injection Inject 1 mL (200 mg total) into the skin every 14 (fourteen) days. INJECT 1 ML UNDER THE SKIN EVERY 2 WEEK 04/08/21   Wendie Agreste, MD  ?traZODone (DESYREL) 50 MG tablet TAKE 1 TO 2 TABLETS(50 TO 100 MG) BY MOUTH AT BEDTIME AS NEEDED FOR SLEEP 05/10/21   Wendie Agreste, MD  ? ?Social History  ? ?Socioeconomic History  ? Marital status: Married  ?  Spouse name: Not on file  ? Number of children: Not on file  ? Years of education: Not on file  ? Highest education level: Not on file  ?Occupational History  ? Not on file  ?Tobacco Use  ? Smoking status: Never  ? Smokeless tobacco: Never  ?Vaping Use  ? Vaping Use: Never used  ?Substance and Sexual Activity  ? Alcohol use: No  ? Drug use: No  ? Sexual activity: Yes  ?  Comment: number of sex partners in the last 73 months  1  ?Other Topics Concern  ? Not on file   ?Social History Narrative  ? Marital status:  Married x 53 years; happily married; second marriage; first wife died AMI age 16.    ?    Children:  1 child; 3 stepchildren.  Several grandchildren.  ?    Lives: with

## 2021-08-15 ENCOUNTER — Other Ambulatory Visit: Payer: Self-pay | Admitting: Family Medicine

## 2021-08-15 DIAGNOSIS — E291 Testicular hypofunction: Secondary | ICD-10-CM

## 2021-08-15 NOTE — Telephone Encounter (Signed)
Controlled substance database reviewed, last filled 07/18/2021.  Medication discussed at his physical April 3.  Testosterone levels noted at that time to be low, but previous injection was 2 weeks prior.  Continued on same regimen.  Refill ordered.

## 2021-08-23 DIAGNOSIS — M5136 Other intervertebral disc degeneration, lumbar region: Secondary | ICD-10-CM | POA: Diagnosis not present

## 2021-08-23 DIAGNOSIS — Z6828 Body mass index (BMI) 28.0-28.9, adult: Secondary | ICD-10-CM | POA: Diagnosis not present

## 2021-08-23 DIAGNOSIS — M545 Low back pain, unspecified: Secondary | ICD-10-CM | POA: Diagnosis not present

## 2021-09-13 DIAGNOSIS — Z6827 Body mass index (BMI) 27.0-27.9, adult: Secondary | ICD-10-CM | POA: Diagnosis not present

## 2021-09-13 DIAGNOSIS — M1288 Other specific arthropathies, not elsewhere classified, other specified site: Secondary | ICD-10-CM | POA: Diagnosis not present

## 2021-09-13 DIAGNOSIS — M5416 Radiculopathy, lumbar region: Secondary | ICD-10-CM | POA: Diagnosis not present

## 2021-09-30 ENCOUNTER — Other Ambulatory Visit: Payer: Self-pay | Admitting: Family Medicine

## 2021-09-30 DIAGNOSIS — F419 Anxiety disorder, unspecified: Secondary | ICD-10-CM

## 2021-10-06 DIAGNOSIS — M5416 Radiculopathy, lumbar region: Secondary | ICD-10-CM | POA: Diagnosis not present

## 2021-10-06 DIAGNOSIS — M5126 Other intervertebral disc displacement, lumbar region: Secondary | ICD-10-CM | POA: Diagnosis not present

## 2021-11-30 ENCOUNTER — Encounter: Payer: Self-pay | Admitting: Family Medicine

## 2021-11-30 ENCOUNTER — Ambulatory Visit: Payer: BC Managed Care – PPO | Admitting: Family Medicine

## 2021-11-30 VITALS — BP 120/80 | HR 65 | Temp 98.3°F | Ht 72.0 in | Wt 205.8 lb

## 2021-11-30 DIAGNOSIS — F419 Anxiety disorder, unspecified: Secondary | ICD-10-CM

## 2021-11-30 DIAGNOSIS — R5383 Other fatigue: Secondary | ICD-10-CM | POA: Diagnosis not present

## 2021-11-30 DIAGNOSIS — Z5181 Encounter for therapeutic drug level monitoring: Secondary | ICD-10-CM

## 2021-11-30 DIAGNOSIS — E291 Testicular hypofunction: Secondary | ICD-10-CM

## 2021-11-30 DIAGNOSIS — E785 Hyperlipidemia, unspecified: Secondary | ICD-10-CM

## 2021-11-30 DIAGNOSIS — Z23 Encounter for immunization: Secondary | ICD-10-CM

## 2021-11-30 DIAGNOSIS — I10 Essential (primary) hypertension: Secondary | ICD-10-CM

## 2021-11-30 LAB — COMPREHENSIVE METABOLIC PANEL
ALT: 23 U/L (ref 0–53)
AST: 20 U/L (ref 0–37)
Albumin: 4.7 g/dL (ref 3.5–5.2)
Alkaline Phosphatase: 68 U/L (ref 39–117)
BUN: 12 mg/dL (ref 6–23)
CO2: 29 mEq/L (ref 19–32)
Calcium: 9.9 mg/dL (ref 8.4–10.5)
Chloride: 101 mEq/L (ref 96–112)
Creatinine, Ser: 1.13 mg/dL (ref 0.40–1.50)
GFR: 69.46 mL/min (ref >60.00)
Glucose, Bld: 98 mg/dL (ref 70–99)
Potassium: 4.5 mEq/L (ref 3.5–5.1)
Sodium: 138 mEq/L (ref 135–145)
Total Bilirubin: 0.5 mg/dL (ref 0.2–1.2)
Total Protein: 7.3 g/dL (ref 6.0–8.3)

## 2021-11-30 LAB — CBC
HCT: 42.6 % (ref 39.0–52.0)
Hemoglobin: 14.5 g/dL (ref 13.0–17.0)
MCHC: 34 g/dL (ref 30.0–36.0)
MCV: 91.4 fl (ref 78.0–100.0)
Platelets: 223 10*3/uL (ref 150.0–400.0)
RBC: 4.66 Mil/uL (ref 4.22–5.81)
RDW: 13.6 % (ref 11.5–15.5)
WBC: 9.3 10*3/uL (ref 4.0–10.5)

## 2021-11-30 LAB — LIPID PANEL
Cholesterol: 169 mg/dL (ref 0–200)
HDL: 58.7 mg/dL (ref >39.00)
LDL Cholesterol: 87 mg/dL (ref 0–99)
NonHDL: 109.84
Total CHOL/HDL Ratio: 3
Triglycerides: 112 mg/dL (ref 0.0–149.0)
VLDL: 22.4 mg/dL (ref 0.0–40.0)

## 2021-11-30 LAB — PSA: PSA: 1.57 ng/mL (ref 0.10–4.00)

## 2021-11-30 LAB — TESTOSTERONE: Testosterone: 870.83 ng/dL (ref 300.00–890.00)

## 2021-11-30 LAB — TSH: TSH: 2.38 u[IU]/mL (ref 0.35–5.50)

## 2021-11-30 LAB — VITAMIN D 25 HYDROXY (VIT D DEFICIENCY, FRACTURES): VITD: 30.02 ng/mL (ref 30.00–100.00)

## 2021-11-30 MED ORDER — METOPROLOL SUCCINATE ER 100 MG PO TB24: ORAL_TABLET | ORAL | 2 refills | Status: DC

## 2021-11-30 MED ORDER — TRAZODONE HCL 100 MG PO TABS: ORAL_TABLET | ORAL | 2 refills | Status: DC

## 2021-11-30 MED ORDER — "VANISHPOINT SAFETY SYRINGE 22G X 1-1/2"" 3 ML MISC": 1 refills | Status: AC

## 2021-11-30 MED ORDER — ROSUVASTATIN CALCIUM 5 MG PO TABS: ORAL_TABLET | ORAL | 2 refills | Status: DC

## 2021-11-30 NOTE — Progress Notes (Signed)
Subjective:  Patient ID: Curtis Yang, male    DOB: [birth year]  Age: 61 y.o. MRN: [id number]  CC:  Chief Complaint  Patient presents with   Hyperlipidemia    Pt states all is well   Hypertension   Anxiety    GAD - 2    HPI Curtis Yang presents for   Seeing Dr. Arnoldo Morale about back issues. No other health changes. Some pain improvement. Decided to hold on injections at this time.   Hypertension: Toprol-XL 100 mg daily Home readings: rare - stable, no new side effects.  BP Readings from Last 3 Encounters:  11/30/21 120/80  05/30/21 130/74  08/18/20 128/76   Lab Results  Component Value Date   CREATININE 1.07 05/30/2021   Hyperlipidemia: Crestor 5 mg daily, no new side effects.  Coffee this am.  Lab Results  Component Value Date   CHOL 174 05/30/2021   HDL 59.80 05/30/2021   LDLCALC 95 05/30/2021   TRIG 96.0 05/30/2021   CHOLHDL 3 05/30/2021   Lab Results  Component Value Date   ALT 15 05/30/2021   AST 14 05/30/2021   ALKPHOS 68 05/30/2021   BILITOT 0.5 05/30/2021   Anxiety Treated with Paxil, was able to wean and treat with trazodone, hydroxyzine if needed.  Also discussed restarting BuSpar if needed.  Stable with trazodone and hydroxyzine at his April visit. Anxiety managed with trazodone 2 pills per night, hydroxyzine few per day -  every day. No sedation or side effects. Does not want to take buspirone - did not like the way he felt. Would like to remain on same dose.     11/30/2021    8:36 AM 08/18/2020   10:41 AM 12/10/2019   11:07 AM  GAD 7 : Generalized Anxiety Score  Nervous, Anxious, on Edge 0 0 2  Control/stop worrying 0 0 1  Worry too much - different things '1 1 2  '$ Trouble relaxing 0 0 1  Restless 1 0 1  Easily annoyed or irritable 0 0 1  Afraid - awful might happen 0 0 1  Total GAD 7 Score '2 1 9  '$ Anxiety Difficulty   Somewhat difficult   Hypogonadism Treated with testosterone 200 mg injection every 2 weeks. Last injection 4 days  ago.  Was off meds for a few weeks prior d/t trouble receiving injection. - felt more fatigued during the day. Rare snoring. No hx of OSA.  ? Vit d low in past, no supplement.  Last vitamin D Lab Results  Component Value Date   VD25OH 38 08/19/2014     Lab Results  Component Value Date   TESTOSTERONE 131.78 (L) 05/30/2021   Lab Results  Component Value Date   PSA1 1.0 10/24/2019   PSA1 1.2 11/18/2018   PSA1 1.3 10/08/2017   PSA 1.09 05/30/2021   PSA 1.01 08/18/2020   PSA 0.92 09/14/2015   Lab Results  Component Value Date   WBC 6.0 05/30/2021   HGB 15.5 05/30/2021   HCT 46.2 05/30/2021   MCV 91.3 05/30/2021   PLT 212.0 05/30/2021   Lab Results  Component Value Date   CHOL 174 05/30/2021   HDL 59.80 05/30/2021   [place] 95 05/30/2021   TRIG 96.0 05/30/2021   CHOLHDL 3 05/30/2021        History Patient Active Problem List   Diagnosis Date Noted   Essential hypertension 03/27/2016   Lumbar stenosis 09/16/2015   Gastroesophageal reflux disease without esophagitis 02/25/2014   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   Benign skin lesion of nose    Depression    Erectile dysfunction    Due to decreased testosterone    GERD (gastroesophageal reflux disease)    NO TROUBLE IN YEARS   History of colon polyps    Hyperlipidemia    Hypertension    Hypogonadism male    Increased glucose level    Insomnia    Low back pain    Past Surgical History:  Procedure Laterality Date   BREAST MASS EXCISION     RIGHT CHEST  BENIGN   COLONOSCOPY W/ POLYPECTOMY  09/2013   One polyp, came  back pre-cancerous. Repeat 5 years unless symptoms present.    EYE SURGERY  02/28/2000   Lasik   MASS EXCISION     RIGHT EAR   BENIGN   SPINE SURGERY  09/26/2012   [facility] [place].   TRANSFORAMINAL LUMBAR INTERBODY FUSION (TLIF) WITH PEDICLE SCREW FIXATION 1 LEVEL Right 04/22/2013   Procedure: TRANSFORAMINAL LUMBAR INTERBODY FUSION (TLIF) WITH PEDICLE SCREW FIXATION LUMBAR FIVE SACRAL ONE;  Surgeon: Faythe Ghee, MD;  Location: [facility];  Service: Neurosurgery;  Laterality: Right;   No Known Allergies Prior to Admission medications   Medication Sig Start Date End Date Taking? Authorizing Provider  hydrOXYzine (ATARAX) 25 MG tablet TAKE 1 TABLET(25 MG) BY MOUTH EVERY 8 HOURS AS NEEDED FOR ANXIETY 09/30/21  Yes Wendie Agreste, MD  metoprolol succinate (TOPROL-XL) 100 MG 24 hr tablet TAKE 1 TABLET(100 MG) BY MOUTH DAILY WITH OR IMMEDIATELY FOLLOWING A MEAL 05/30/21  Yes Wendie Agreste, MD  rosuvastatin (CRESTOR) 5 MG tablet TAKE 1 TABLET(5 MG) BY MOUTH DAILY 05/30/21  Yes Wendie Agreste, MD  testosterone cypionate (DEPOTESTOSTERONE CYPIONATE) 200 MG/ML injection INJECT 1ML INTO THE SKIN EVERY 14 DAYS. INJECT 1ML UNDER THE SKIN EVERY 2 WEEK. 08/15/21  Yes Wendie Agreste, MD  traZODone (DESYREL) 50 MG tablet TAKE 1 TO 2 TABLETS(50 TO 100 MG) BY MOUTH AT BEDTIME AS NEEDED FOR SLEEP 05/30/21  Yes Wendie Agreste, MD  SYRINGE-NEEDLE, DISP, 3 ML (VANISHPOINT SAFETY SYRINGE) 22G X 1-1/2" 3 ML MISC USE AS DIRECTED TO SELF-ADMINISTER TESTOSTERONE 11/30/21   Wendie Agreste, MD   Social History   Socioeconomic History   Marital status: Married    Spouse name: Not on file   Number of children: Not on file   Years of education: Not on file   Highest education level: Not on file  Occupational History   Not on file  Tobacco Use   Smoking status: Never   Smokeless tobacco: Never  Vaping Use   Vaping Use: Never used  Substance and Sexual Activity   Alcohol use: No    Drug use: No   Sexual activity: Yes    Comment: number of sex partners in the last 12 months  1  Other Topics Concern   Not on file  Social History Narrative   Marital status:  Married x 102 years; happily married; second marriage; first wife died AMI  age 49.        Children:  1 child; 3 stepchildren.  Several grandchildren.      Lives: with wife.      Employment:  [employer]; Print production planner x 19 years; happy.  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        Tobacco: never       Alcohol:  None      Drugs: none      Exercise:  Walking twice daily 2-3 miles total each day.  Job physically demanding.        Seatbelt: 100%; no texting      Guns: yes; unloaded; in safe.      Sunscreen: SPF on face.   Social Determinants of Health   Financial Resource Strain: Not on file  Food Insecurity: Not on file  Transportation Needs: Not on file  Physical Activity: Not on file  Stress: Not on file  Social Connections: Not on file  Intimate Partner Violence: Not on file    Review of Systems  Constitutional:  Negative for fatigue and unexpected weight change.  Eyes:  Negative for visual disturbance.  Respiratory:  Negative for cough, chest tightness and shortness of breath.   Cardiovascular:  Negative for chest pain, palpitations and leg swelling.  Gastrointestinal:  Negative for abdominal pain and blood in stool.  Neurological:  Negative for dizziness, light-headedness and headaches.     Objective:   Vitals:   11/30/21 0840  BP: 120/80  Pulse: 65  Temp: 98.3 F (36.8 C)  SpO2: 97%  Weight: 205 lb 12.8 oz (93.4 kg)  Height: 6' (1.829 m)     Physical Exam Vitals reviewed.  Constitutional:      Appearance: He is well-developed.  HENT:     Head: Normocephalic and atraumatic.  Neck:     Vascular: No carotid bruit or JVD.  Cardiovascular:     Rate and Rhythm: Normal rate and regular rhythm.     Heart sounds: Normal heart sounds. No murmur heard. Pulmonary:     Effort:  Pulmonary effort is normal.     Breath sounds: Normal breath sounds. No rales.  Musculoskeletal:     Right lower leg: No edema.     Left lower leg: No edema.  Skin:    General: Skin is warm and dry.  Neurological:     Mental Status: He is alert and oriented to person, place, and time.  Psychiatric:        Mood and Affect: Mood normal.        Assessment & Plan:  Curtis Yang is a 61 y.o. male . Hypogonadism in male - Plan: SYRINGE-NEEDLE, DISP, 3 ML (VANISHPOINT SAFETY SYRINGE) 22G X 1-1/2" 3 ML MISC, Testosterone, PSA Fatigue, unspecified type - Plan: Vitamin D (25 hydroxy), CBC, TSH, Comprehensive metabolic panel  -Fatigue appears to be associated with time off of testosterone.  Check screening labs above, recent testosterone injection, anticipate improvement.  If persistent fatigue, follow-up discussed to look into other causes.  Need for influenza vaccination - Plan: Flu Vaccine QUAD 6+ mos PF IM (Fluarix Quad PF)  Need for tetanus booster - Plan: Tdap vaccine greater than or equal to 7yo IM Medication monitoring encounter - Plan: CBC, Lipid panel, Comprehensive metabolic panel, PSA  Anxiety - Well controlled on trazodone 100 mg, will change dosing for convenience to 1 pill.  Stable with hydroxyzine, denies side effects with that medication, and declines changes in meds including buspirone with prior  side effects.  No changes for now.    Essential hypertension - See HPI.  Ambulatory measures well controlled.  - Plan: metoprolol succinate (TOPROL-XL) 100 MG 24 hr tablet  -  Stable, tolerating current regimen. Medications refilled. Labs pending as above.   Hyperlipidemia, unspecified hyperlipidemia type - Plan: rosuvastatin (CRESTOR) 5 MG tablet  -  Stable, tolerating current regimen. Medications refilled. Labs pending as above.    Meds ordered this encounter  Medications   SYRINGE-NEEDLE, DISP, 3 ML (VANISHPOINT SAFETY SYRINGE) 22G X 1-1/2" 3 ML MISC    Sig: USE AS  DIRECTED TO SELF-ADMINISTER TESTOSTERONE    Dispense:  50 each    Refill:  1   traZODone (DESYREL) 100 MG tablet    Sig: TAKE 1 BY MOUTH AT BEDTIME AS NEEDED FOR SLEEP    Dispense:  90 tablet    Refill:  2   metoprolol succinate (TOPROL-XL) 100 MG 24 hr tablet    Sig: TAKE 1 TABLET(100 MG) BY MOUTH DAILY WITH OR IMMEDIATELY FOLLOWING A MEAL    Dispense:  90 tablet    Refill:  2   rosuvastatin (CRESTOR) 5 MG tablet    Sig: TAKE 1 TABLET(5 MG) BY MOUTH DAILY    Dispense:  90 tablet    Refill:  2   Patient Instructions  If fatigue does not improve with return of consistent testosterone dosing, let me know. No med changes today.  If concerns on labs, I will let you know. Thanks for coming in today.     Signed,   Merri Ray, MD [place] 11/30/21 9:37 AM

## 2021-11-30 NOTE — Patient Instructions (Addendum)
If fatigue does not improve with return of consistent testosterone dosing, let me know. No med changes today.  If concerns on labs, I will let you know. Thanks for coming in today.

## 2022-01-22 ENCOUNTER — Other Ambulatory Visit: Payer: Self-pay | Admitting: Family Medicine

## 2022-01-22 DIAGNOSIS — E291 Testicular hypofunction: Secondary | ICD-10-CM

## 2022-01-23 NOTE — Telephone Encounter (Signed)
Testosterone  LOV: 11/30/21 Last Refill:08/15/21 Upcoming appt: none

## 2022-01-23 NOTE — Telephone Encounter (Signed)
Medication discussed at his August 4 visit.  Labs obtained at that time.  Refill ordered.  Controlled substance database reviewed without concerns.

## 2022-02-01 ENCOUNTER — Other Ambulatory Visit: Payer: Self-pay | Admitting: Lab

## 2022-02-01 DIAGNOSIS — E291 Testicular hypofunction: Secondary | ICD-10-CM

## 2022-02-01 MED ORDER — TESTOSTERONE CYPIONATE 200 MG/ML IM SOLN: INTRAMUSCULAR | 2 refills | Status: DC

## 2022-02-02 ENCOUNTER — Other Ambulatory Visit (HOSPITAL_COMMUNITY): Payer: Self-pay

## 2022-02-02 ENCOUNTER — Telehealth: Payer: Self-pay

## 2022-02-02 DIAGNOSIS — E291 Testicular hypofunction: Secondary | ICD-10-CM

## 2022-02-02 NOTE — Telephone Encounter (Signed)
Pharmacy Patient Advocate Encounter   Received notification from [facility] that prior authorization for Testosterone Cypionate '200MG'$ /ML intramuscular solution is required/requested.   PA submitted on 02/02/22 to [employer] via CoverMyMeds Key [id number]  Status is pending

## 2022-02-06 NOTE — Telephone Encounter (Signed)
Please review denial and advise further action

## 2022-02-06 NOTE — Telephone Encounter (Signed)
Pharmacy Patient Advocate Encounter  Received notification from [facility] that the request for prior authorization for Testosterone Cypionate '200MG'$ /ML has been denied due to .

## 2022-02-07 NOTE — Telephone Encounter (Signed)
It appears prior low was as 1124 am.please contact patient and explain reason for denial. Will need additional lab off testosterone, that is low in the morning.  Can schedule lab only visit for repeat testosterone - close to 8 am if possible, and can be obtained 2 weeks after his last injection. Let me know if there are questions.

## 2022-02-08 NOTE — Telephone Encounter (Signed)
Called patient no answer LM to call back to discuss

## 2022-02-08 NOTE — Telephone Encounter (Signed)
Patient called back to speak with [facility].

## 2022-02-08 NOTE — Telephone Encounter (Signed)
Called back no answer 

## 2022-02-09 NOTE — Telephone Encounter (Signed)
Noted. Thanks.

## 2022-02-09 NOTE — Telephone Encounter (Signed)
Patient called back and we discussed. Turns out this has not been covered for several years and patient has been paying out of pocket for this with [facility]. He has stated we do not need to persue the PA any further. I will put a note on the top of his file for this

## 2022-02-09 NOTE — Telephone Encounter (Signed)
Called and left another message today  Patient needs to do repeat testosterone check at or as close to 8:00am as possible this is the most accurate time for [place] to obtain a testosterone level and we believe an updated testosterone level may help get the prior authorization to go through for the testosterone injection.  This lab must be 2 weeks after his last injection and at 8:00am for the best accuracy.

## 2022-03-24 ENCOUNTER — Other Ambulatory Visit: Payer: Self-pay | Admitting: Family Medicine

## 2022-03-24 DIAGNOSIS — F419 Anxiety disorder, unspecified: Secondary | ICD-10-CM

## 2022-04-27 ENCOUNTER — Other Ambulatory Visit: Payer: Self-pay | Admitting: Family Medicine

## 2022-04-27 DIAGNOSIS — F419 Anxiety disorder, unspecified: Secondary | ICD-10-CM

## 2022-06-05 ENCOUNTER — Encounter: Payer: Self-pay | Admitting: Family Medicine

## 2022-06-05 ENCOUNTER — Ambulatory Visit (INDEPENDENT_AMBULATORY_CARE_PROVIDER_SITE_OTHER): Payer: BC Managed Care – PPO | Admitting: Family Medicine

## 2022-06-05 VITALS — BP 118/74 | HR 71 | Temp 98.4°F | Ht 72.0 in | Wt 213.6 lb

## 2022-06-05 DIAGNOSIS — Z0000 Encounter for general adult medical examination without abnormal findings: Secondary | ICD-10-CM | POA: Diagnosis not present

## 2022-06-05 DIAGNOSIS — E785 Hyperlipidemia, unspecified: Secondary | ICD-10-CM

## 2022-06-05 DIAGNOSIS — E291 Testicular hypofunction: Secondary | ICD-10-CM

## 2022-06-05 DIAGNOSIS — Z130 Encounter for screening for diseases of the blood and blood-forming organs and certain disorders involving the immune mechanism: Secondary | ICD-10-CM

## 2022-06-05 DIAGNOSIS — I10 Essential (primary) hypertension: Secondary | ICD-10-CM | POA: Diagnosis not present

## 2022-06-05 DIAGNOSIS — Z131 Encounter for screening for diabetes mellitus: Secondary | ICD-10-CM

## 2022-06-05 DIAGNOSIS — Z23 Encounter for immunization: Secondary | ICD-10-CM

## 2022-06-05 DIAGNOSIS — Z5181 Encounter for therapeutic drug level monitoring: Secondary | ICD-10-CM | POA: Diagnosis not present

## 2022-06-05 DIAGNOSIS — F419 Anxiety disorder, unspecified: Secondary | ICD-10-CM

## 2022-06-05 LAB — COMPREHENSIVE METABOLIC PANEL
ALT: 28 U/L (ref 0–53)
AST: 23 U/L (ref 0–37)
Albumin: 4.5 g/dL (ref 3.5–5.2)
Alkaline Phosphatase: 70 U/L (ref 39–117)
BUN: 10 mg/dL (ref 6–23)
CO2: 28 mEq/L (ref 19–32)
Calcium: 9.7 mg/dL (ref 8.4–10.5)
Chloride: 103 mEq/L (ref 96–112)
Creatinine, Ser: 1.05 mg/dL (ref 0.40–1.50)
GFR: 75.59 mL/min (ref >60.00)
Glucose, Bld: 101 mg/dL — ABNORMAL HIGH (ref 70–99)
Potassium: 4.7 mEq/L (ref 3.5–5.1)
Sodium: 140 mEq/L (ref 135–145)
Total Bilirubin: 0.4 mg/dL (ref 0.2–1.2)
Total Protein: 6.8 g/dL (ref 6.0–8.3)

## 2022-06-05 LAB — LIPID PANEL
Cholesterol: 171 mg/dL (ref 0–200)
HDL: 59 mg/dL (ref >39.00)
LDL Cholesterol: 80 mg/dL (ref 0–99)
NonHDL: 111.77
Total CHOL/HDL Ratio: 3
Triglycerides: 160 mg/dL — ABNORMAL HIGH (ref 0.0–149.0)
VLDL: 32 mg/dL (ref 0.0–40.0)

## 2022-06-05 LAB — CBC WITH DIFFERENTIAL/PLATELET
Basophils Absolute: 0 10*3/uL (ref 0.0–0.1)
Basophils Relative: 0.6 % (ref 0.0–3.0)
Eosinophils Absolute: 0.2 10*3/uL (ref 0.0–0.7)
Eosinophils Relative: 3.1 % (ref 0.0–5.0)
HCT: 43.7 % (ref 39.0–52.0)
Hemoglobin: 15.2 g/dL (ref 13.0–17.0)
Lymphocytes Relative: 27.2 % (ref 12.0–46.0)
Lymphs Abs: 2 10*3/uL (ref 0.7–4.0)
MCHC: 34.7 g/dL (ref 30.0–36.0)
MCV: 91.1 fl (ref 78.0–100.0)
Monocytes Absolute: 0.5 10*3/uL (ref 0.1–1.0)
Monocytes Relative: 7.1 % (ref 3.0–12.0)
Neutro Abs: 4.6 10*3/uL (ref 1.4–7.7)
Neutrophils Relative %: 62 % (ref 43.0–77.0)
Platelets: 230 10*3/uL (ref 150.0–400.0)
RBC: 4.8 Mil/uL (ref 4.22–5.81)
RDW: 13.4 % (ref 11.5–15.5)
WBC: 7.4 10*3/uL (ref 4.0–10.5)

## 2022-06-05 LAB — TESTOSTERONE: Testosterone: 178.39 ng/dL — ABNORMAL LOW (ref 300.00–890.00)

## 2022-06-05 LAB — HEMOGLOBIN A1C: Hgb A1c MFr Bld: 5.9 % (ref 4.6–6.5)

## 2022-06-05 LAB — PSA: PSA: 1.09 ng/mL (ref 0.10–4.00)

## 2022-06-05 MED ORDER — TRAZODONE HCL 100 MG PO TABS: ORAL_TABLET | ORAL | 2 refills | Status: AC

## 2022-06-05 MED ORDER — ROSUVASTATIN CALCIUM 5 MG PO TABS: ORAL_TABLET | ORAL | 2 refills | Status: DC

## 2022-06-05 MED ORDER — METOPROLOL SUCCINATE ER 100 MG PO TB24: ORAL_TABLET | ORAL | 2 refills | Status: DC

## 2022-06-05 NOTE — Progress Notes (Signed)
Subjective:  Patient ID: Curtis Yang, male    DOB: [birth year]  Age: 62 y.o. MRN: [id number]  CC:  Chief Complaint  Patient presents with   Annual Exam    Pt states he is doing well     HPI Curtis Yang presents for Annual Exam  No concerns or health changes.   Neurosurgery, Dr. Lovell Sheehan, back issues, option of injections previously.  Improving in October. Still up and down - appt in June.    Hypertension: Toprol 100 mg XL daily. No new side effects.  Home readings: 120/70-75 BP Readings from Last 3 Encounters:  06/05/22 118/74  11/30/21 120/80  05/30/21 130/74   Lab Results  Component Value Date   CREATININE 1.13 11/30/2021   Hyperlipidemia: Crestor 5 mg daily. No new myalgias/side effects. Lab Results  Component Value Date   CHOL 169 11/30/2021   HDL 58.70 11/30/2021   LDLCALC 87 11/30/2021   TRIG 112.0 11/30/2021   CHOLHDL 3 11/30/2021   Lab Results  Component Value Date   ALT 23 11/30/2021   AST 20 11/30/2021   ALKPHOS 68 11/30/2021   BILITOT 0.5 11/30/2021   Anxiety Previously treated with Paxil, BuSpar, had weaned to just trazodone 2 pills per night at his October visit as well as hydroxyzine few times per day without sedation or other side effects.  Not like the way he felt on buspirone and wanted to remain on same dose of meds at the time.  Still working well with Trazodone, hydroxyzine -2-3 per day. No sedation with hydroxyzine.      06/05/2022    8:40 AM 06/05/2022    8:34 AM 11/30/2021    8:36 AM 08/18/2020   10:41 AM  GAD 7 : Generalized Anxiety Score  Nervous, Anxious, on Edge 1 0 0 0  Control/stop worrying 0 0 0 0  Worry too much - different things 0 0 1 1  Trouble relaxing 0 0 0 0  Restless 0 0 1 0  Easily annoyed or irritable 1 0 0 0  Afraid - awful might happen 0 0 0 0  Total GAD 7 Score 2 0 2 1      06/05/2022    8:40 AM 06/05/2022    8:33 AM 11/30/2021    8:35 AM 05/30/2021    8:04 AM 08/18/2020   10:42 AM  Depression screen  PHQ 2/9  Decreased Interest 0 0 0 0 0  Down, Depressed, Hopeless 0 0 0 0 0  PHQ - 2 Score 0 0 0 0 0  Altered sleeping 1 0 1 2 1   Tired, decreased energy 1 0 1 1 1   Change in appetite 0 0 0 0 0  Feeling bad or failure about yourself  0 0 0 0 0  Trouble concentrating 0 0 0 1 0  Moving slowly or fidgety/restless 0  0 0 0  Suicidal thoughts 0 0 0 0 0  PHQ-9 Score 2 0 2 4 2   Difficult doing work/chores Not difficult at all Not difficult at all      Hypogonadism Testosterone 200 mg injection every 2 weeks.  Some fatigue off his testosterone last visit, with RTC precautions given - fatigue resolved.  Last injection 15 days ago. Due.  Lab Results  Component Value Date   PSA1 1.0 10/24/2019   PSA1 1.2 11/18/2018   PSA1 1.3 10/08/2017   PSA 1.57 11/30/2021   PSA 1.09 05/30/2021   PSA 1.01 08/18/2020   Lab Results  Component Value Date   TESTOSTERONE 870.83 11/30/2021   Lab Results  Component Value Date   WBC 9.3 11/30/2021   HGB 14.5 11/30/2021   HCT 42.6 11/30/2021   MCV 91.4 11/30/2021   PLT 223.0 11/30/2021    Last vitamin D Lab Results  Component Value Date   VD25OH 30.02 11/30/2021  On supplement daily past few weeks.    Health Maintenance  Topic Date Due   Zoster Vaccines- Shingrix (1 of 2) 12/30/2008   DTaP/Tdap/Td (4 - Td or Tdap) 10/17/2021   COVID-19 Vaccine (4 - 2023-24 season) 10/28/2021   INFLUENZA VACCINE  09/28/2022   COLONOSCOPY (Pts 45-59yrs Insurance coverage will need to be confirmed)  12/25/2025   Hepatitis C Screening  Completed   HIV Screening  Completed   HPV VACCINES  Aged Out  Colonoscopy - 2020 - repeat 7 years (Dr. Russella Dar, tubular adenoma) Prostate: does not have family history of prostate cancer - possibly paternal GF.  The natural history of prostate cancer and ongoing controversy regarding screening and potential treatment outcomes of prostate cancer has been discussed with the patient. The meaning of a false positive PSA and a false  negative PSA has been discussed. He indicates understanding of the limitations of this screening test and wishes  to proceed with screening PSA testing. Lab Results  Component Value Date   PSA1 1.0 10/24/2019   PSA1 1.2 11/18/2018   PSA1 1.3 10/08/2017   PSA 1.57 11/30/2021   PSA 1.09 05/30/2021   PSA 1.01 08/18/2020      Immunization History  Administered Date(s) Administered   DTaP 07/04/2004, 11/14/2007   Hepatitis A 04/20/2004, 09/26/2004   Influenza, Seasonal, Injecte, Preservative Fre 01/24/2012   Influenza,inj,Quad PF,6+ Mos 01/20/2013, 12/05/2013, 02/12/2015, 02/09/2016, 12/26/2016, 11/18/2018, 10/24/2019, 11/30/2021   Influenza-Unspecified 03/29/2017, 10/04/2017, 10/21/2018   PFIZER(Purple Top)SARS-COV-2 Vaccination 05/23/2019, 06/17/2019, 03/19/2020   PPD Test 04/04/2012   Tdap 10/18/2011   Zoster, Live 01/21/2013   Zoster, Unspecified 10/12/2021   flu vaccination last October. Did not receive tdap - requests today.  Declines covid booster.  Shingrix at [employer] [facility] [place].   No results found. Optho within past few months - new glasses working well.   Dental: Dr. Alvester Morin, within past 6 months.   Alcohol: rare beer - less than once per week.   Tobacco: none.   Exercise: few times per week, walk. Physical work.    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   Benign skin lesion  of nose    Depression    Erectile dysfunction    Due to decreased testosterone    GERD (gastroesophageal reflux disease)    NO TROUBLE IN YEARS   History of colon polyps    Hyperlipidemia    Hypertension    Hypogonadism male    Increased glucose level    Insomnia    Low back pain    Past  Surgical History:  Procedure Laterality Date   BREAST MASS EXCISION     RIGHT CHEST  BENIGN   COLONOSCOPY W/ POLYPECTOMY  09/2013   One polyp, came back pre-cancerous. Repeat 5 years unless symptoms present.    EYE SURGERY  02/28/2000   Lasik   MASS EXCISION     RIGHT EAR   BENIGN   SPINE SURGERY  09/26/2012   [facility] [place]   TRANSFORAMINAL LUMBAR INTERBODY FUSION (TLIF) WITH PEDICLE SCREW FIXATION 1 LEVEL Right 04/22/2013   Procedure: TRANSFORAMINAL LUMBAR INTERBODY FUSION (TLIF) WITH PEDICLE SCREW FIXATION LUMBAR FIVE SACRAL ONE;  Surgeon: Reinaldo Meekerandy O Kritzer, MD;  Location: [facility];  Service: Neurosurgery;  Laterality: Right;   No Known Allergies Prior to Admission medications   Medication Sig Start Date End Date Taking? Authorizing Provider  hydrOXYzine (ATARAX) 25 MG tablet TAKE 1 TABLET(25 MG) BY MOUTH EVERY 8 HOURS AS NEEDED FOR ANXIETY 03/24/22   Shade FloodGreene, Shylin Keizer R, MD  metoprolol succinate (TOPROL-XL) 100 MG 24 hr tablet TAKE 1 TABLET(100 MG) BY MOUTH DAILY WITH OR IMMEDIATELY FOLLOWING A MEAL 11/30/21   Shade FloodGreene, Xsavier Seeley R, MD  rosuvastatin (CRESTOR) 5 MG tablet TAKE 1 TABLET(5 MG) BY MOUTH DAILY 11/30/21   Shade FloodGreene, Sharene Krikorian R, MD  SYRINGE-NEEDLE, DISP, 3 ML (VANISHPOINT SAFETY SYRINGE) 22G X 1-1/2" 3 ML MISC USE AS DIRECTED TO SELF-ADMINISTER TESTOSTERONE 11/30/21   Shade FloodGreene, Kashmir Lysaght R, MD  testosterone cypionate (DEPOTESTOSTERONE CYPIONATE) 200 MG/ML injection INJECT 1ML INTO THE SKIN EVERY 14 DAY 02/01/22   Shade FloodGreene, Damontae Loppnow R, MD  traZODone (DESYREL) 100 MG tablet TAKE 1 BY MOUTH AT BEDTIME AS NEEDED FOR SLEEP 11/30/21   Shade FloodGreene, Meredeth Furber R, MD  traZODone (DESYREL) 50 MG tablet  TAKE 1 TO 2 TABLETS(50 TO 100 MG) BY MOUTH AT BEDTIME AS NEEDED FOR SLEEP 04/27/22   Shade FloodGreene, Neftali Abair R, MD   Social History   Socioeconomic History   Marital status: Married    Spouse name: Not on file   Number of children: Not on file   Years of education: Not on file   Highest education level: Not on file  Occupational History   Not on file  Tobacco Use   Smoking status: Never   Smokeless tobacco: Never  Vaping Use   Vaping Use: Never used  Substance and Sexual Activity   Alcohol use: No   Drug use: No   Sexual activity: Yes    Comment: number of sex partners in the last 12 months  1  Other Topics Concern   Not on file  Social History Narrative   Marital status:  Married x 19 years; happily married; second marriage; first wife died AMI age 64.        Children:  1 child; 3 stepchildren.  Several grandchildren.      Lives: with wife.      Employment:  [employer]; Librarian, academicboiler service technician x 19 years; happy.  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        Tobacco: never       Alcohol:  None      Drugs: none      Exercise:  Walking twice daily 2-3 miles total each day.  Job physically demanding.        Seatbelt: 100%; no texting      Guns: yes; unloaded; in safe.      Sunscreen: SPF on face.   Social Determinants of Health   Financial Resource Strain: Not on file  Food Insecurity: Not on file  Transportation Needs: Not on file  Physical Activity: Not on file  Stress: Not on file  Social Connections: Not on file  Intimate Partner Violence: Not on file    Review of Systems 13 point review of systems per patient health survey noted.  Negative other than as indicated above or in HPI.    Objective:   Vitals:   06/05/22 0834  BP: 118/74  Pulse: 71  Temp: 98.4 F (36.9 C)  TempSrc: Temporal  SpO2: 96%  Weight: 213 lb 9.6 oz (96.9 kg)  Height: 6' (1.829 m)     Physical Exam Vitals reviewed.  Constitutional:      Appearance: He is well-developed.  HENT:      Head: Normocephalic and atraumatic.     Right Ear: External ear normal.     Left Ear: External ear normal.  Eyes:     Conjunctiva/sclera: Conjunctivae normal.     Pupils: Pupils are equal, round, and reactive to light.  Neck:     Thyroid: No thyromegaly.  Cardiovascular:     Rate and Rhythm: Normal rate and regular rhythm.     Heart sounds: Normal heart sounds.  Pulmonary:     Effort: Pulmonary effort is normal. No respiratory distress.     Breath sounds: Normal breath sounds. No wheezing.  Abdominal:     General: There is no distension.     Palpations: Abdomen is soft.     Tenderness: There is no abdominal tenderness.  Musculoskeletal:        General: No tenderness. Normal range of motion.     Cervical back: Normal range of motion and neck supple.  Lymphadenopathy:     Cervical: No cervical adenopathy.  Skin:    General: Skin is warm and dry.  Neurological:     Mental Status: He is alert and oriented to person, place, and time.     Deep Tendon Reflexes: Reflexes are normal and symmetric.  Psychiatric:        Behavior: Behavior normal.        Assessment & Plan:  Curtis Yang is a 62 y.o. male . Annual physical exam  - -anticipatory guidance as below in AVS, screening labs above. Health maintenance items as above in HPI discussed/recommended as applicable.   Essential hypertension - See HPI.  Ambulatory measures well controlled.  - Plan: metoprolol succinate (TOPROL-XL) 100 MG 24 hr tablet, Comprehensive metabolic panel, CBC w/Diff  -  Stable, tolerating current regimen. Medications refilled. Labs pending as above.   Hyperlipidemia, unspecified hyperlipidemia type - Plan: rosuvastatin (CRESTOR) 5 MG tablet, Comprehensive metabolic panel, Lipid panel  -  Stable, tolerating current regimen. Medications refilled. Labs pending as above.   Screening for deficiency anemia - Plan: CBC w/Diff  Screening for diabetes mellitus - Plan: Hemoglobin A1c  Need for  diphtheria-tetanus-pertussis (Tdap) vaccine - Plan: Tdap vaccine greater than or equal to 7yo IM  Anxiety - Plan: traZODone (DESYREL) 100 MG tablet  -Stable with trazodone, hydroxyzine 2 to 3/day without reported sedation, well-controlled on this regimen, declines changes.  Medication monitoring encounter - Plan: Comprehensive metabolic panel, Lipid panel, CBC w/Diff, PSA, Testosterone Hypogonadism in male - Plan: Testosterone  -15 days since last injection, check testosterone level, consider testing at 1 week into injection for most reliable level.  No change in regimen for now, check monitoring labs as above.  Meds ordered this encounter  Medications   metoprolol succinate (TOPROL-XL) 100 MG 24 hr tablet  Sig: TAKE 1 TABLET(100 MG) BY MOUTH DAILY WITH OR IMMEDIATELY FOLLOWING A MEAL    Dispense:  90 tablet    Refill:  2   rosuvastatin (CRESTOR) 5 MG tablet    Sig: TAKE 1 TABLET(5 MG) BY MOUTH DAILY    Dispense:  90 tablet    Refill:  2   traZODone (DESYREL) 100 MG tablet    Sig: TAKE 1 BY MOUTH AT BEDTIME AS NEEDED FOR SLEEP    Dispense:  90 tablet    Refill:  2   Patient Instructions  No change in meds today. I will let you know if there are any concerns on your labs. Take care.   Preventive Care 28-42 Years Old, Male Preventive care refers to lifestyle choices and visits with your health care provider that can promote health and wellness. Preventive care visits are also called wellness exams. What can I expect for my preventive care visit? Counseling During your preventive care visit, your health care provider may ask about your: Medical history, including: Past medical problems. Family medical history. Current health, including: Emotional well-being. Home life and relationship well-being. Sexual activity. Lifestyle, including: Alcohol, nicotine or tobacco, and drug use. Access to firearms. Diet, exercise, and sleep habits. Safety issues such as seatbelt and bike  helmet use. Sunscreen use. Work and work Astronomer. Physical exam Your health care provider will check your: Height and weight. These may be used to calculate your BMI (body mass index). BMI is a measurement that tells if you are at a healthy weight. Waist circumference. This measures the distance around your waistline. This measurement also tells if you are at a healthy weight and may help predict your risk of certain diseases, such as type 2 diabetes and high blood pressure. Heart rate and blood pressure. Body temperature. Skin for abnormal spots. What immunizations do I need?  Vaccines are usually given at various ages, according to a schedule. Your health care provider will recommend vaccines for you based on your age, medical history, and lifestyle or other factors, such as travel or where you work. What tests do I need? Screening Your health care provider may recommend screening tests for certain conditions. This may include: Lipid and cholesterol levels. Diabetes screening. This is done by checking your blood sugar (glucose) after you have not eaten for a while (fasting). Hepatitis B test. Hepatitis C test. HIV (human immunodeficiency virus) test. STI (sexually transmitted infection) testing, if you are at risk. Lung cancer screening. Prostate cancer screening. Colorectal cancer screening. Talk with your health care provider about your test results, treatment options, and if necessary, the need for more tests. Follow these instructions at home: Eating and drinking  Eat a diet that includes fresh fruits and vegetables, whole grains, lean protein, and low-fat dairy products. Take vitamin and mineral supplements as recommended by your health care provider. Do not drink alcohol if your health care provider tells you not to drink. If you drink alcohol: Limit how much you have to 0-2 drinks a day. Know how much alcohol is in your drink. In the [place] one drink equals one 12 oz  bottle of beer (355 mL), one 5 oz glass of wine (148 mL), or one 1 oz glass of hard liquor (44 mL). Lifestyle Brush your teeth every morning and night with fluoride toothpaste. Floss one time each day. Exercise for at least 30 minutes 5 or more days each week. Do not use any products that contain nicotine or tobacco. These products include  cigarettes, chewing tobacco, and vaping devices, such as e-cigarettes. If you need help quitting, ask your health care provider. Do not use drugs. If you are sexually active, practice safe sex. Use a condom or other form of protection to prevent STIs. Take aspirin only as told by your health care provider. Make sure that you understand how much to take and what form to take. Work with your health care provider to find out whether it is safe and beneficial for you to take aspirin daily. Find healthy ways to manage stress, such as: Meditation, yoga, or listening to music. Journaling. Talking to a trusted person. Spending time with friends and family. Minimize exposure to UV radiation to reduce your risk of skin cancer. Safety Always wear your seat belt while driving or riding in a vehicle. Do not drive: If you have been drinking alcohol. Do not ride with someone who has been drinking. When you are tired or distracted. While texting. If you have been using any mind-altering substances or drugs. Wear a helmet and other protective equipment during sports activities. If you have firearms in your house, make sure you follow all gun safety procedures. What's next? Go to your health care provider once a year for an annual wellness visit. Ask your health care provider how often you should have your eyes and teeth checked. Stay up to date on all vaccines. This information is not intended to replace advice given to you by your health care provider. Make sure you discuss any questions you have with your health care provider. Document Revised: 08/11/2020 Document  Reviewed: 08/11/2020 Elsevier Patient Education  2023 Elsevier Inc.     Signed,   Meredith Staggers, MD [facility], [facility] 06/05/22 9:18 AM

## 2022-06-05 NOTE — Patient Instructions (Signed)
No change in meds today. I will let you know if there are any concerns on your labs. Take care.   Preventive Care 9-64 Years Old, Male Preventive care refers to lifestyle choices and visits with your health care provider that can promote health and wellness. Preventive care visits are also called wellness exams. What can I expect for my preventive care visit? Counseling During your preventive care visit, your health care provider may ask about your: Medical history, including: Past medical problems. Family medical history. Current health, including: Emotional well-being. Home life and relationship well-being. Sexual activity. Lifestyle, including: Alcohol, nicotine or tobacco, and drug use. Access to firearms. Diet, exercise, and sleep habits. Safety issues such as seatbelt and bike helmet use. Sunscreen use. Work and work Astronomer. Physical exam Your health care provider will check your: Height and weight. These may be used to calculate your BMI (body mass index). BMI is a measurement that tells if you are at a healthy weight. Waist circumference. This measures the distance around your waistline. This measurement also tells if you are at a healthy weight and may help predict your risk of certain diseases, such as type 2 diabetes and high blood pressure. Heart rate and blood pressure. Body temperature. Skin for abnormal spots. What immunizations do I need?  Vaccines are usually given at various ages, according to a schedule. Your health care provider will recommend vaccines for you based on your age, medical history, and lifestyle or other factors, such as travel or where you work. What tests do I need? Screening Your health care provider may recommend screening tests for certain conditions. This may include: Lipid and cholesterol levels. Diabetes screening. This is done by checking your blood sugar (glucose) after you have not eaten for a while (fasting). Hepatitis B  test. Hepatitis C test. HIV (human immunodeficiency virus) test. STI (sexually transmitted infection) testing, if you are at risk. Lung cancer screening. Prostate cancer screening. Colorectal cancer screening. Talk with your health care provider about your test results, treatment options, and if necessary, the need for more tests. Follow these instructions at home: Eating and drinking  Eat a diet that includes fresh fruits and vegetables, whole grains, lean protein, and low-fat dairy products. Take vitamin and mineral supplements as recommended by your health care provider. Do not drink alcohol if your health care provider tells you not to drink. If you drink alcohol: Limit how much you have to 0-2 drinks a day. Know how much alcohol is in your drink. In the [place] one drink equals one 12 oz bottle of beer (355 mL), one 5 oz glass of wine (148 mL), or one 1 oz glass of hard liquor (44 mL). Lifestyle Brush your teeth every morning and night with fluoride toothpaste. Floss one time each day. Exercise for at least 30 minutes 5 or more days each week. Do not use any products that contain nicotine or tobacco. These products include cigarettes, chewing tobacco, and vaping devices, such as e-cigarettes. If you need help quitting, ask your health care provider. Do not use drugs. If you are sexually active, practice safe sex. Use a condom or other form of protection to prevent STIs. Take aspirin only as told by your health care provider. Make sure that you understand how much to take and what form to take. Work with your health care provider to find out whether it is safe and beneficial for you to take aspirin daily. Find healthy ways to manage stress, such as: Meditation, yoga, or  listening to music. Journaling. Talking to a trusted person. Spending time with friends and family. Minimize exposure to UV radiation to reduce your risk of skin cancer. Safety Always wear your seat belt while  driving or riding in a vehicle. Do not drive: If you have been drinking alcohol. Do not ride with someone who has been drinking. When you are tired or distracted. While texting. If you have been using any mind-altering substances or drugs. Wear a helmet and other protective equipment during sports activities. If you have firearms in your house, make sure you follow all gun safety procedures. What's next? Go to your health care provider once a year for an annual wellness visit. Ask your health care provider how often you should have your eyes and teeth checked. Stay up to date on all vaccines. This information is not intended to replace advice given to you by your health care provider. Make sure you discuss any questions you have with your health care provider. Document Revised: 08/11/2020 Document Reviewed: 08/11/2020 Elsevier Patient Education  [employer].

## 2022-06-13 ENCOUNTER — Encounter: Payer: Self-pay | Admitting: Family Medicine

## 2022-06-30 ENCOUNTER — Other Ambulatory Visit: Payer: Self-pay

## 2022-06-30 DIAGNOSIS — E785 Hyperlipidemia, unspecified: Secondary | ICD-10-CM

## 2022-06-30 MED ORDER — ROSUVASTATIN CALCIUM 10 MG PO TABS
ORAL_TABLET | ORAL | 0 refills | Status: DC
Start: 2022-06-30 — End: 2022-06-30

## 2022-06-30 MED ORDER — ROSUVASTATIN CALCIUM 10 MG PO TABS
ORAL_TABLET | ORAL | 0 refills | Status: DC
Start: 2022-06-30 — End: 2022-10-13

## 2022-06-30 NOTE — Telephone Encounter (Signed)
Pt states she has not had any issues with taking 2 of the 5mg  each day and wants to proceed with 10 mg as discussed.

## 2022-07-19 ENCOUNTER — Other Ambulatory Visit: Payer: Self-pay | Admitting: Family Medicine

## 2022-07-19 DIAGNOSIS — E291 Testicular hypofunction: Secondary | ICD-10-CM

## 2022-07-19 NOTE — Telephone Encounter (Signed)
Medication discussed at his physical April 8 testosterone low at that time, mid dose repeat testing recommended.  Will continue same regimen for now, testosterone ordered after review of controlled substance database without concerns, last filled 06/20/2022, previously 05/23/22.

## 2022-07-19 NOTE — Telephone Encounter (Signed)
Testerone 200 mg LOV: 06/05/22 Last Refill:02/01/22 Upcoming appt: 12/06/22

## 2022-07-20 ENCOUNTER — Other Ambulatory Visit: Payer: Self-pay | Admitting: Family Medicine

## 2022-07-20 DIAGNOSIS — E291 Testicular hypofunction: Secondary | ICD-10-CM

## 2022-07-21 NOTE — Telephone Encounter (Signed)
Medication discussed at his April visit, with labs at that time.  Controlled substance database reviewed.  Testosterone last filled 06/20/2022, previously 05/23/2022.   I just ordered this on May 22, was that refill not authorized?

## 2022-07-21 NOTE — Telephone Encounter (Signed)
Testosterone 200 mg LOV: 06/05/22 Last Refill:07/19/22 Upcoming appt: 12/06/22

## 2022-07-24 ENCOUNTER — Other Ambulatory Visit (HOSPITAL_COMMUNITY): Payer: Self-pay

## 2022-07-27 ENCOUNTER — Telehealth: Payer: Self-pay

## 2022-07-27 NOTE — Telephone Encounter (Signed)
PA initiated via Covermymeds; KEY: [id number]. Awaiting determination.

## 2022-08-04 ENCOUNTER — Telehealth: Payer: Self-pay

## 2022-08-04 NOTE — Telephone Encounter (Signed)
Received a fax from [facility] [place] requesting additional clinical information for authorization of Testosterone is needed.   Faxed collection times for low testosterone labs to [phone].  Case ID: [id number]

## 2022-08-07 NOTE — Telephone Encounter (Signed)
See other message

## 2022-08-08 ENCOUNTER — Other Ambulatory Visit (HOSPITAL_COMMUNITY): Payer: Self-pay

## 2022-08-08 NOTE — Telephone Encounter (Signed)
Patient Advocate Encounter  Prior Authorization for Testosterone Cypionate 200mg /ml has been approved with [employer] [address].    PA# [id number] Effective dates: 07/27/22 through 07/26/23  Per [facility] test claim, copay for 56 days supply is $66  Placed a call to [facility] pharmacy to notify of the approval. Stated the patient has used a discount card and will process the insurance at next pickup.   Approval letter indexed to chart.

## 2022-08-08 NOTE — Telephone Encounter (Signed)
Left pt a detailed VM stating the approval and the his cost is $66 .

## 2022-09-12 ENCOUNTER — Other Ambulatory Visit: Payer: Self-pay | Admitting: Family Medicine

## 2022-09-12 DIAGNOSIS — F419 Anxiety disorder, unspecified: Secondary | ICD-10-CM

## 2022-10-03 ENCOUNTER — Other Ambulatory Visit: Payer: Self-pay | Admitting: Family Medicine

## 2022-10-03 DIAGNOSIS — E291 Testicular hypofunction: Secondary | ICD-10-CM

## 2022-10-03 NOTE — Telephone Encounter (Signed)
Testosterone  LOV: 06/05/22 Last Refill:07/19/22 Upcoming appt: 12/06/22

## 2022-10-03 NOTE — Telephone Encounter (Signed)
Medication discussed at physical in April, option to recheck level mid dose but continued on same regimen.  Controlled substance database reviewed.  Refill ordered.

## 2022-10-13 ENCOUNTER — Other Ambulatory Visit: Payer: Self-pay | Admitting: Family Medicine

## 2022-10-13 DIAGNOSIS — E785 Hyperlipidemia, unspecified: Secondary | ICD-10-CM

## 2022-11-12 ENCOUNTER — Other Ambulatory Visit: Payer: Self-pay | Admitting: Family Medicine

## 2022-11-12 DIAGNOSIS — F419 Anxiety disorder, unspecified: Secondary | ICD-10-CM

## 2022-12-06 ENCOUNTER — Ambulatory Visit: Payer: BC Managed Care – PPO | Admitting: Family Medicine

## 2023-05-15 ENCOUNTER — Other Ambulatory Visit: Payer: Self-pay | Admitting: Family Medicine

## 2023-05-15 DIAGNOSIS — I10 Essential (primary) hypertension: Secondary | ICD-10-CM

## 2023-08-09 ENCOUNTER — Other Ambulatory Visit: Payer: Self-pay | Admitting: Family Medicine

## 2023-08-09 DIAGNOSIS — F419 Anxiety disorder, unspecified: Secondary | ICD-10-CM

## 2023-08-09 NOTE — Telephone Encounter (Signed)
 LM to call back and make an appointment
